# Patient Record
Sex: Male | Born: 1935 | Race: Black or African American | Hispanic: No | Marital: Single | Smoking: Former smoker
Health system: Southern US, Community
[De-identification: ages and names within clinical notes are randomized; demographics above are authoritative.]

## PROBLEM LIST (undated history)

## (undated) DIAGNOSIS — K219 Gastro-esophageal reflux disease without esophagitis: Secondary | ICD-10-CM

## (undated) DIAGNOSIS — I10 Essential (primary) hypertension: Secondary | ICD-10-CM

## (undated) DIAGNOSIS — Z87898 Personal history of other specified conditions: Secondary | ICD-10-CM

## (undated) DIAGNOSIS — E785 Hyperlipidemia, unspecified: Secondary | ICD-10-CM

## (undated) DIAGNOSIS — R011 Cardiac murmur, unspecified: Secondary | ICD-10-CM

## (undated) DIAGNOSIS — K5790 Diverticulosis of intestine, part unspecified, without perforation or abscess without bleeding: Secondary | ICD-10-CM

## (undated) DIAGNOSIS — Z86010 Personal history of colonic polyps: Secondary | ICD-10-CM

## (undated) HISTORY — DX: Hyperlipidemia, unspecified: E78.5

## (undated) HISTORY — DX: Gastro-esophageal reflux disease without esophagitis: K21.9

## (undated) HISTORY — DX: Personal history of other specified conditions: Z87.898

## (undated) HISTORY — DX: Essential (primary) hypertension: I10

## (undated) HISTORY — DX: Diverticulosis of intestine, part unspecified, without perforation or abscess without bleeding: K57.90

## (undated) HISTORY — DX: Cardiac murmur, unspecified: R01.1

## (undated) HISTORY — DX: Personal history of colonic polyps: Z86.010

## (undated) HISTORY — PX: COLONOSCOPY W/ POLYPECTOMY: SHX1380

## (undated) HISTORY — PX: CATARACT EXTRACTION: SUR2

---

## 1964-11-27 DIAGNOSIS — Z87898 Personal history of other specified conditions: Secondary | ICD-10-CM

## 1964-11-27 HISTORY — DX: Personal history of other specified conditions: Z87.898

## 2002-04-30 ENCOUNTER — Encounter (INDEPENDENT_AMBULATORY_CARE_PROVIDER_SITE_OTHER): Payer: Self-pay

## 2002-04-30 ENCOUNTER — Ambulatory Visit (HOSPITAL_COMMUNITY): Admission: RE | Admit: 2002-04-30 | Discharge: 2002-04-30 | Payer: Self-pay | Admitting: Gastroenterology

## 2004-06-02 ENCOUNTER — Encounter: Admission: RE | Admit: 2004-06-02 | Discharge: 2004-06-02 | Payer: Self-pay | Admitting: Internal Medicine

## 2004-11-27 DIAGNOSIS — K5790 Diverticulosis of intestine, part unspecified, without perforation or abscess without bleeding: Secondary | ICD-10-CM

## 2004-11-27 HISTORY — DX: Diverticulosis of intestine, part unspecified, without perforation or abscess without bleeding: K57.90

## 2005-04-17 ENCOUNTER — Ambulatory Visit: Payer: Self-pay | Admitting: Internal Medicine

## 2005-04-27 HISTORY — PX: COLONOSCOPY: SHX174

## 2005-04-27 LAB — HM COLONOSCOPY: HM Colonoscopy: NORMAL

## 2005-05-17 ENCOUNTER — Ambulatory Visit: Payer: Self-pay | Admitting: Internal Medicine

## 2005-08-15 ENCOUNTER — Ambulatory Visit: Payer: Self-pay | Admitting: Internal Medicine

## 2005-08-22 ENCOUNTER — Ambulatory Visit: Payer: Self-pay | Admitting: Internal Medicine

## 2005-09-01 ENCOUNTER — Ambulatory Visit: Payer: Self-pay | Admitting: Internal Medicine

## 2005-12-19 ENCOUNTER — Ambulatory Visit: Payer: Self-pay | Admitting: Internal Medicine

## 2005-12-20 ENCOUNTER — Ambulatory Visit: Payer: Self-pay

## 2006-02-27 ENCOUNTER — Ambulatory Visit: Payer: Self-pay | Admitting: Internal Medicine

## 2006-04-18 ENCOUNTER — Ambulatory Visit: Payer: Self-pay | Admitting: Internal Medicine

## 2006-07-02 ENCOUNTER — Ambulatory Visit: Payer: Self-pay | Admitting: Internal Medicine

## 2006-09-19 ENCOUNTER — Ambulatory Visit: Payer: Self-pay | Admitting: Internal Medicine

## 2006-10-23 ENCOUNTER — Ambulatory Visit: Payer: Self-pay | Admitting: Internal Medicine

## 2006-11-08 ENCOUNTER — Ambulatory Visit: Payer: Self-pay | Admitting: Internal Medicine

## 2006-12-06 ENCOUNTER — Ambulatory Visit: Payer: Self-pay | Admitting: Internal Medicine

## 2006-12-06 LAB — CONVERTED CEMR LAB
Chol/HDL Ratio, serum: 2.5
Cholesterol: 87 mg/dL (ref 0–200)
Glucose, Bld: 98 mg/dL (ref 70–99)

## 2006-12-24 ENCOUNTER — Ambulatory Visit: Payer: Self-pay | Admitting: Internal Medicine

## 2007-03-15 DIAGNOSIS — R011 Cardiac murmur, unspecified: Secondary | ICD-10-CM | POA: Insufficient documentation

## 2007-03-15 DIAGNOSIS — Z86010 Personal history of colonic polyps: Secondary | ICD-10-CM | POA: Insufficient documentation

## 2007-03-15 DIAGNOSIS — R748 Abnormal levels of other serum enzymes: Secondary | ICD-10-CM | POA: Insufficient documentation

## 2007-04-23 ENCOUNTER — Ambulatory Visit: Payer: Self-pay | Admitting: Internal Medicine

## 2007-04-23 ENCOUNTER — Encounter: Payer: Self-pay | Admitting: Internal Medicine

## 2007-04-23 DIAGNOSIS — E785 Hyperlipidemia, unspecified: Secondary | ICD-10-CM | POA: Insufficient documentation

## 2007-04-23 DIAGNOSIS — I10 Essential (primary) hypertension: Secondary | ICD-10-CM | POA: Insufficient documentation

## 2007-04-23 LAB — CONVERTED CEMR LAB
Basophils Absolute: 0 10*3/uL (ref 0.0–0.1)
Basophils Relative: 0.3 % (ref 0.0–1.0)
Eosinophils Absolute: 0.1 10*3/uL (ref 0.0–0.6)
Eosinophils Relative: 1.5 % (ref 0.0–5.0)
HCT: 43.5 % (ref 39.0–52.0)
HDL: 33.4 mg/dL — ABNORMAL LOW (ref >39.0)
LDL Cholesterol: 66 mg/dL (ref 0–99)
Monocytes Absolute: 0.4 10*3/uL (ref 0.2–0.7)
Neutro Abs: 2.8 10*3/uL (ref 1.4–7.7)
RBC: 4.76 M/uL (ref 4.22–5.81)
Total CHOL/HDL Ratio: 3.3
Triglycerides: 54 mg/dL (ref 0–149)
WBC: 4.6 10*3/uL (ref 4.5–10.5)

## 2007-04-30 ENCOUNTER — Encounter: Payer: Self-pay | Admitting: Internal Medicine

## 2007-05-09 ENCOUNTER — Encounter: Payer: Self-pay | Admitting: Internal Medicine

## 2007-05-13 ENCOUNTER — Telehealth (INDEPENDENT_AMBULATORY_CARE_PROVIDER_SITE_OTHER): Payer: Self-pay | Admitting: *Deleted

## 2007-05-15 ENCOUNTER — Encounter: Payer: Self-pay | Admitting: Internal Medicine

## 2007-05-22 ENCOUNTER — Encounter: Payer: Self-pay | Admitting: Internal Medicine

## 2007-09-11 ENCOUNTER — Ambulatory Visit: Payer: Self-pay | Admitting: Internal Medicine

## 2008-01-14 ENCOUNTER — Telehealth (INDEPENDENT_AMBULATORY_CARE_PROVIDER_SITE_OTHER): Payer: Self-pay | Admitting: *Deleted

## 2008-01-22 ENCOUNTER — Encounter: Payer: Self-pay | Admitting: Internal Medicine

## 2008-01-31 ENCOUNTER — Telehealth (INDEPENDENT_AMBULATORY_CARE_PROVIDER_SITE_OTHER): Payer: Self-pay | Admitting: *Deleted

## 2008-02-05 ENCOUNTER — Encounter: Payer: Self-pay | Admitting: Internal Medicine

## 2008-02-19 ENCOUNTER — Encounter: Payer: Self-pay | Admitting: Internal Medicine

## 2008-03-19 ENCOUNTER — Ambulatory Visit: Payer: Self-pay | Admitting: Internal Medicine

## 2008-03-29 LAB — CONVERTED CEMR LAB
Albumin: 3.9 g/dL (ref 3.5–5.2)
Bilirubin, Direct: 0.1 mg/dL (ref 0.0–0.3)
CO2: 31 meq/L (ref 19–32)
Calcium: 9.3 mg/dL (ref 8.4–10.5)
Chloride: 105 meq/L (ref 96–112)
Cholesterol: 107 mg/dL (ref 0–200)
GFR calc Af Amer: 77 mL/min
Glucose, Bld: 94 mg/dL (ref 70–99)
Total Bilirubin: 1 mg/dL (ref 0.3–1.2)
Triglycerides: 52 mg/dL (ref 0–149)

## 2008-03-31 ENCOUNTER — Encounter (INDEPENDENT_AMBULATORY_CARE_PROVIDER_SITE_OTHER): Payer: Self-pay | Admitting: *Deleted

## 2008-05-21 ENCOUNTER — Ambulatory Visit: Payer: Self-pay | Admitting: Internal Medicine

## 2008-05-21 ENCOUNTER — Telehealth (INDEPENDENT_AMBULATORY_CARE_PROVIDER_SITE_OTHER): Payer: Self-pay | Admitting: *Deleted

## 2008-05-21 DIAGNOSIS — K219 Gastro-esophageal reflux disease without esophagitis: Secondary | ICD-10-CM | POA: Insufficient documentation

## 2008-05-21 DIAGNOSIS — R351 Nocturia: Secondary | ICD-10-CM | POA: Insufficient documentation

## 2008-05-22 ENCOUNTER — Encounter (INDEPENDENT_AMBULATORY_CARE_PROVIDER_SITE_OTHER): Payer: Self-pay | Admitting: *Deleted

## 2008-06-10 ENCOUNTER — Encounter: Payer: Self-pay | Admitting: Internal Medicine

## 2008-07-22 ENCOUNTER — Encounter: Payer: Self-pay | Admitting: Internal Medicine

## 2008-08-19 ENCOUNTER — Ambulatory Visit: Payer: Self-pay | Admitting: Internal Medicine

## 2008-10-15 ENCOUNTER — Telehealth (INDEPENDENT_AMBULATORY_CARE_PROVIDER_SITE_OTHER): Payer: Self-pay | Admitting: *Deleted

## 2008-10-27 ENCOUNTER — Telehealth (INDEPENDENT_AMBULATORY_CARE_PROVIDER_SITE_OTHER): Payer: Self-pay | Admitting: *Deleted

## 2009-01-06 ENCOUNTER — Telehealth (INDEPENDENT_AMBULATORY_CARE_PROVIDER_SITE_OTHER): Payer: Self-pay | Admitting: *Deleted

## 2009-03-15 ENCOUNTER — Encounter: Payer: Self-pay | Admitting: Internal Medicine

## 2009-05-04 ENCOUNTER — Encounter: Payer: Self-pay | Admitting: Internal Medicine

## 2009-05-04 ENCOUNTER — Telehealth (INDEPENDENT_AMBULATORY_CARE_PROVIDER_SITE_OTHER): Payer: Self-pay | Admitting: *Deleted

## 2009-05-18 ENCOUNTER — Ambulatory Visit: Payer: Self-pay | Admitting: Internal Medicine

## 2009-05-18 LAB — CONVERTED CEMR LAB
Albumin: 3.7 g/dL (ref 3.5–5.2)
Basophils Absolute: 0 10*3/uL (ref 0.0–0.1)
CO2: 29 meq/L (ref 19–32)
Cholesterol: 90 mg/dL (ref 0–200)
Eosinophils Absolute: 0.1 10*3/uL (ref 0.0–0.7)
GFR calc non Af Amer: 84.83 mL/min (ref >60)
HDL: 33.4 mg/dL — ABNORMAL LOW (ref >39.00)
MCHC: 34.7 g/dL (ref 30.0–36.0)
MCV: 92.1 fL (ref 78.0–100.0)
Monocytes Absolute: 0.4 10*3/uL (ref 0.1–1.0)
Monocytes Relative: 9 % (ref 3.0–12.0)
Neutro Abs: 2.5 10*3/uL (ref 1.4–7.7)
RBC: 4.32 M/uL (ref 4.22–5.81)
Total CHOL/HDL Ratio: 3
Total Protein: 6.7 g/dL (ref 6.0–8.3)
Triglycerides: 48 mg/dL (ref 0.0–149.0)
VLDL: 9.6 mg/dL (ref 0.0–40.0)
WBC: 4.6 10*3/uL (ref 4.5–10.5)

## 2009-05-24 ENCOUNTER — Ambulatory Visit: Payer: Self-pay | Admitting: Internal Medicine

## 2009-05-24 DIAGNOSIS — D696 Thrombocytopenia, unspecified: Secondary | ICD-10-CM | POA: Insufficient documentation

## 2009-05-24 DIAGNOSIS — R51 Headache: Secondary | ICD-10-CM | POA: Insufficient documentation

## 2009-05-24 DIAGNOSIS — R519 Headache, unspecified: Secondary | ICD-10-CM | POA: Insufficient documentation

## 2009-05-24 DIAGNOSIS — I491 Atrial premature depolarization: Secondary | ICD-10-CM | POA: Insufficient documentation

## 2009-05-24 DIAGNOSIS — M25519 Pain in unspecified shoulder: Secondary | ICD-10-CM | POA: Insufficient documentation

## 2009-05-25 ENCOUNTER — Encounter: Payer: Self-pay | Admitting: Internal Medicine

## 2009-05-27 ENCOUNTER — Encounter (INDEPENDENT_AMBULATORY_CARE_PROVIDER_SITE_OTHER): Payer: Self-pay | Admitting: *Deleted

## 2009-05-27 ENCOUNTER — Ambulatory Visit: Payer: Self-pay | Admitting: Internal Medicine

## 2009-05-27 LAB — CONVERTED CEMR LAB
OCCULT 1: NEGATIVE
OCCULT 2: NEGATIVE
OCCULT 3: NEGATIVE

## 2009-07-07 ENCOUNTER — Telehealth (INDEPENDENT_AMBULATORY_CARE_PROVIDER_SITE_OTHER): Payer: Self-pay | Admitting: *Deleted

## 2009-07-23 ENCOUNTER — Telehealth (INDEPENDENT_AMBULATORY_CARE_PROVIDER_SITE_OTHER): Payer: Self-pay | Admitting: *Deleted

## 2009-07-30 ENCOUNTER — Telehealth (INDEPENDENT_AMBULATORY_CARE_PROVIDER_SITE_OTHER): Payer: Self-pay | Admitting: *Deleted

## 2009-08-16 ENCOUNTER — Telehealth (INDEPENDENT_AMBULATORY_CARE_PROVIDER_SITE_OTHER): Payer: Self-pay | Admitting: *Deleted

## 2009-08-24 ENCOUNTER — Ambulatory Visit: Payer: Self-pay | Admitting: Internal Medicine

## 2009-10-13 ENCOUNTER — Ambulatory Visit: Payer: Self-pay | Admitting: Internal Medicine

## 2009-10-16 LAB — CONVERTED CEMR LAB
AST: 28 units/L (ref 0–37)
Bilirubin, Direct: 0 mg/dL (ref 0.0–0.3)
Total Bilirubin: 0.8 mg/dL (ref 0.3–1.2)
Total Protein: 7 g/dL (ref 6.0–8.3)
Triglycerides: 56 mg/dL (ref 0.0–149.0)
VLDL: 11.2 mg/dL (ref 0.0–40.0)

## 2009-10-18 ENCOUNTER — Encounter (INDEPENDENT_AMBULATORY_CARE_PROVIDER_SITE_OTHER): Payer: Self-pay | Admitting: *Deleted

## 2009-11-10 ENCOUNTER — Telehealth (INDEPENDENT_AMBULATORY_CARE_PROVIDER_SITE_OTHER): Payer: Self-pay | Admitting: *Deleted

## 2009-12-22 ENCOUNTER — Telehealth (INDEPENDENT_AMBULATORY_CARE_PROVIDER_SITE_OTHER): Payer: Self-pay | Admitting: *Deleted

## 2010-05-23 ENCOUNTER — Encounter: Payer: Self-pay | Admitting: Internal Medicine

## 2010-05-31 ENCOUNTER — Ambulatory Visit: Payer: Self-pay | Admitting: Internal Medicine

## 2010-05-31 DIAGNOSIS — N400 Enlarged prostate without lower urinary tract symptoms: Secondary | ICD-10-CM | POA: Insufficient documentation

## 2010-06-01 LAB — CONVERTED CEMR LAB
ALT: 25 units/L (ref 0–53)
AST: 27 units/L (ref 0–37)
Alkaline Phosphatase: 89 units/L (ref 39–117)
BUN: 15 mg/dL (ref 6–23)
Bilirubin, Direct: 0.2 mg/dL (ref 0.0–0.3)
Calcium: 9 mg/dL (ref 8.4–10.5)
Chloride: 108 meq/L (ref 96–112)
Eosinophils Relative: 1 % (ref 0.0–5.0)
GFR calc non Af Amer: 92.28 mL/min (ref >60)
Glucose, Bld: 94 mg/dL (ref 70–99)
Lymphs Abs: 1.2 10*3/uL (ref 0.7–4.0)
MCV: 92 fL (ref 78.0–100.0)
Monocytes Relative: 8.1 % (ref 3.0–12.0)
PSA: 2 ng/mL (ref 0.10–4.00)
Platelets: 122 10*3/uL — ABNORMAL LOW (ref 150.0–400.0)
RBC: 4.55 M/uL (ref 4.22–5.81)
RDW: 12.9 % (ref 11.5–14.6)
Sodium: 141 meq/L (ref 135–145)
Total Bilirubin: 0.7 mg/dL (ref 0.3–1.2)
Total CHOL/HDL Ratio: 3
Total Protein: 7.1 g/dL (ref 6.0–8.3)
Triglycerides: 64 mg/dL (ref 0.0–149.0)
WBC: 4.9 10*3/uL (ref 4.5–10.5)

## 2010-06-20 ENCOUNTER — Encounter: Payer: Self-pay | Admitting: Internal Medicine

## 2010-08-02 ENCOUNTER — Ambulatory Visit: Payer: Self-pay | Admitting: Internal Medicine

## 2010-12-25 LAB — CONVERTED CEMR LAB: LDL Goal: 90 mg/dL

## 2010-12-27 NOTE — Progress Notes (Signed)
Summary: pt. Rx. refill request  Phone Note Refill Request Message from:  Patient on December 22, 2009 2:29 PM  Refills Requested: Medication #1:  TOPROL XL 100 MG  TB24 take one tablet daily   Brand Name Necessary? No  Medication #2:  NEXIUM 40 MG CPDR 1 q am.   Brand Name Necessary? No  Medication #3:  DOXAZOSIN MESYLATE 4 MG TABS 1 by mouth two times a day   Brand Name Necessary? No  Medication #4:  SIMVASTATIN 40 MG TABS 1 by mouth once daily   Brand Name Necessary? No Needs a generic for Nexium because his co pay for this is 175.00 Pt. would like for you leave his rx. at the front desk.... He would like a 90day supply. call pt. if you have any problems [phone]  Initial call taken by: Otho Ket,  December 22, 2009 2:31 PM  Follow-up for Phone Call        Dr.Hopper please advise Follow-up by: Georgette Dover,  December 22, 2009 3:11 PM  Additional Follow-up for Phone Call Additional follow up Details #1::        Omeprazole 20 mg #90; Refill others X 90 days Additional Follow-up by: Unice Cobble MD,  December 22, 2009 4:35 PM    Additional Follow-up for Phone Call Additional follow up Details #2::    Patient aware rx's avaliable for pick-up./[facility]  December 22, 2009 5:03 PM   New/Updated Medications: OMEPRAZOLE 20 MG TBEC (OMEPRAZOLE) 1 by mouth once daily Prescriptions: OMEPRAZOLE 20 MG TBEC (OMEPRAZOLE) 1 by mouth once daily  #90 x 2   Entered by:   Georgette Dover   Authorized by:   Unice Cobble MD   Signed by:   Georgette Dover on 12/22/2009   Method used:   Reprint   [id number] OMEPRAZOLE 20 MG TBEC (OMEPRAZOLE) 1 by mouth once daily  #0 x 2   Entered by:   Georgette Dover   Authorized by:   Unice Cobble MD   Signed by:   Georgette Dover on 12/22/2009   Method used:   Print then Give to Patient   RxID:   [id number] DOXAZOSIN MESYLATE 4 MG TABS (DOXAZOSIN MESYLATE) 1 by mouth two times a day  #180 x 2   Entered by:   Georgette Dover  Authorized by:   Unice Cobble MD   Signed by:   Georgette Dover on 12/22/2009   Method used:   Print then Give to Patient   RxID:   [id number] TOPROL XL 100 MG  TB24 (METOPROLOL SUCCINATE) take one tablet daily  #90 x 2   Entered by:   Georgette Dover   Authorized by:   Unice Cobble MD   Signed by:   Georgette Dover on 12/22/2009   Method used:   Print then Give to Patient   RxID:   [id number] SIMVASTATIN 40 MG TABS (SIMVASTATIN) 1 by mouth once daily  #90 x 2   Entered by:   Georgette Dover   Authorized by:   Unice Cobble MD   Signed by:   Georgette Dover on 12/22/2009   Method used:   Print then Give to Patient   RxID:   [id number]

## 2010-12-27 NOTE — Letter (Signed)
Summary: [facility]  [facility]   Imported By: Edmonia James 06/08/2010 10:23:42  _____________________________________________________________________  External Attachment:    Type:   Image     Comment:   External Document

## 2010-12-27 NOTE — Assessment & Plan Note (Signed)
Summary: fasting cpx- jr--Rm 5   Vital Signs:  Patient profile:   75 year old male Height:      73 inches Weight:      192.13 pounds BMI:     25.44 Temp:     98.3 degrees F oral Pulse rate:   72 / minute Pulse rhythm:   regular Resp:     14 per minute BP sitting:   130 / 80  (left arm) Cuff size:   regular  Vitals Entered By: Kelle Darting CMA Deborra Medina) (May 31, 2010 11:04 AM) CC: Rm 5   Physical, fasting., Shoulder pain Is Patient Diabetic? No   CC:  Rm 5   Physical, fasting., and Shoulder pain.  History of Present Illness: Here for Medicare AWV:  1.Risk factors based on Past M, S, F history:Arthritis R shoulder & LLE, hyperlipidemia, ERD 2.Physical Activities: yardwork; he plans to start walking program 3.Depression/mood:active issues  denied 4.Hearing: no loss; normal to whisper @ 6 ft 5.ADL's: no limitations, fully independent 6.Fall Risk: denied  7.Home Safety:no environmental or emotional risks present 8.Height, weight, &visual acuity:vision normal w/o lenses reading wall chart; bifocals for near vision  9.Counseling: none requested 10.Labs ordered based on risk factors: see Orders; Dr Diona Fanti requested PSA 11. Referral Coordination: Colonoscopy 06/20/2010, Dr Earlean Shawl . Dr Sherlean Foot, Optometrist, to be seen 06/2010 12. Care Plan: see Instructions 13. Cognitive Assessment: Oriented X3;recall & memory intact; no mood change noted Hyperlipidemia Follow-Up      This is a 75 year old man who presents for Hyperlipidemia follow-up.  The patient reports occasional  muscle aches, but denies GI upset, abdominal pain, flushing, itching, constipation, diarrhea, and fatigue.  Other symptoms include rare palpitations.  The patient denies the following symptoms: chest pain/pressure, exercise intolerance, dypsnea, syncope, and pedal edema.  Compliance with medications (by patient report) has been near 100%.  Dietary compliance has been good.  The patient reports exercising occasionally.   Adjunctive measures currently used by the patient include ASA and fish oil supplements.   Shoulder Pain      The patient also presents with Shoulder pain.  The patient denies numbness, weakness, tingling, locking, stiffness, impaired ROM, swelling, and redness.  The pain is located in the right shoulder.  The pain began gradually and without injury.  The patient describes the pain as intermittent and aching.  The pain is better with activity.  The pain is worse with rest in RLDP. He has pain LLE in LLDP also.    Preventive Screening-Counseling & Management  Alcohol-Tobacco     Alcohol drinks/day: 0     Smoking Status: quit > 6 months     Year Quit: 1969  Caffeine-Diet-Exercise     Caffeine use/day: 1 cup /day     Diet Comments: low fat diet  Hep-HIV-STD-Contraception     Dental Visit-last 6 months yes     Sun Exposure-Excessive: no  Safety-Violence-Falls     Seat Belt Use: yes     Firearms in the Home: firearms in the home     Smoke Detectors: yes     Violence in the Home: no risk noted     Fall Risk: none      Sexual History:  currently monogamous.        Drug Use:  never.        Blood Transfusions:  no.        Travel History:  no travel recently.    Allergies (verified): No Known Drug Allergies  Family History: Mother:MI @  age 49  Siblings: brother:colon polyps,HTN,MI  @ age 32  Father :cns  cancer  (brain tumor)  Social History: Heart Healthy Retired Former Smoker: 1969 Alcohol use-yes: rare, < 1X/month Smoking Status:  quit > 6 months Caffeine use/day:  1 cup /day Dental Care w/in 6 mos.:  yes Sun Exposure-Excessive:  no Seat Belt Use:  yes Fall Risk:  none Sexual History:  currently monogamous Blood Transfusions:  no Drug Use:  never   Impression & Recommendations:  Problem # 1:  [place] (ICD-V70.0)  Orders: Subsequent annual wellness visit with prevention plan [id number])  Problem # 2:  SHOULDER PAIN, RIGHT (ICD-719.41)  His updated  medication list for this problem includes:    Aspirin 81 Mg Tbec (Aspirin) .Marland Kitchen... Take one tablet daily  Problem # 3:  HYPERLIPIDEMIA (ICD-272.4)  His updated medication list for this problem includes:    Simvastatin 40 Mg Tabs (Simvastatin) .Marland Kitchen... 1 by mouth once daily  Orders: Venipuncture [id number]) TLB-Lipid Panel (80061-LIPID) TLB-BMP (Basic Metabolic Panel-BMET) (99991111) TLB-Hepatic/Liver Function Pnl (80076-HEPATIC) TLB-TSH (Thyroid Stimulating Hormone) (84443-TSH)  Problem # 4:  HYPERTROPHY PROSTATE W/O UR OBST & OTH LUTS (ICD-600.00)  PSA as per Dr Diona Fanti His updated medication list for this problem includes:    Doxazosin Mesylate 4 Mg Tabs (Doxazosin mesylate) .Marland Kitchen... 1 by mouth two times a day  Orders: TLB-PSA (Prostate Specific Antigen) (84153-PSA)  Problem # 5:  GERD (ICD-530.81)  His updated medication list for this problem includes:    Hyomax-ft 0.125 Mg Tbdp (Hyoscyamine sulfate) .Marland Kitchen... Take one tablet as needed    Omeprazole 20 Mg Tbec (Omeprazole) .Marland Kitchen... 1 by mouth once daily  Orders: TLB-CBC Platelet - w/Differential (85025-CBCD)  Problem # 6:  COLONIC POLYPS, HX OF (ICD-V12.72) as per Dr Earlean Shawl  Complete Medication List: 1)  Simvastatin 40 Mg Tabs (Simvastatin) .Marland Kitchen.. 1 by mouth once daily 2)  Toprol Xl 100 Mg Tb24 (Metoprolol succinate) .... Take one tablet daily 3)  Doxazosin Mesylate 4 Mg Tabs (Doxazosin mesylate) .Marland Kitchen.. 1 by mouth two times a day 4)  Aspirin 81 Mg Tbec (Aspirin) .... Take one tablet daily 5)  Hyomax-ft 0.125 Mg Tbdp (Hyoscyamine sulfate) .... Take one tablet as needed 6)  Flaxseed Oil 1000 Mg Caps (Flaxseed (linseed)) .... Take one tablet daily 7)  Fish Oil 1200 Mg Caps (Omega-3 fatty acids) .... Take one tablet daily 8)  Centrum Silver Tabs (Multiple vitamins-minerals) .... Take one tablet daily 9)  Omeprazole 20 Mg Tbec (Omeprazole) .Marland Kitchen.. 1 by mouth once daily 10)  Oxybutynin Chloride 10 Mg Xr24h-tab (Oxybutynin chloride) .... Take  1 tablet by mouth once a day 11)  Amoxicillin 500 Mg Caps (Amoxicillin) .... Take 4 capsules before dental visit.  Patient Instructions: 1)  Avoid foods high in acid (tomatoes, citrus juices, spicy foods). Avoid eating within two hours of lying down or before exercising. Do not over eat; try smaller more frequent meals. Elevate head of bed twelve inches when sleeping. 2)  It is important that you exercise regularly at least 20 minutes 5 times a week. If you develop chest pain, have severe difficulty breathing, or feel very tired , stop exercising immediately and seek medical attention. 3)  Choose your Health care [place] and/or prepare a Living Will.  Current Allergies (reviewed today): No known allergies     Preventive Care Screening  Colonoscopy:    Date:  04/27/2005    Results:  normal

## 2010-12-27 NOTE — Assessment & Plan Note (Signed)
Summary: flu shot/kn   Nurse Visit   Allergies: No Known Drug Allergies  Orders Added: 1)  Flu Vaccine 39yrs + MEDICARE PATIENTS [Q2039] 2)  Administration Flu vaccine - MCR [id number] Flu Vaccine Consent Questions     Do you have a history of severe allergic reactions to this vaccine? no    Any prior history of allergic reactions to egg and/or gelatin? no    Do you have a sensitivity to the preservative Thimersol? no    Do you have a past history of Guillan-Barre Syndrome? no    Do you currently have an acute febrile illness? no    Have you ever had a severe reaction to latex? no    Vaccine information given and explained to patient? yes    Are you currently pregnant? no    Lot Number:[id number]   Exp Date:05/27/2011   Site Given  Right Deltoid IMistration Flu vaccine - MCR [id number] .lbmedflu

## 2010-12-27 NOTE — Procedures (Signed)
Summary: Colonoscopy/[facility]  Colonoscopy/[facility]   Imported By: Edmonia James 07/04/2010 12:58:51  _____________________________________________________________________  External Attachment:    Type:   Image     Comment:   External Document

## 2011-02-04 ENCOUNTER — Encounter: Payer: Self-pay | Admitting: Internal Medicine

## 2011-02-17 ENCOUNTER — Other Ambulatory Visit: Payer: Self-pay | Admitting: Internal Medicine

## 2011-03-16 ENCOUNTER — Other Ambulatory Visit: Payer: Self-pay | Admitting: Internal Medicine

## 2011-03-20 ENCOUNTER — Other Ambulatory Visit: Payer: Self-pay | Admitting: Internal Medicine

## 2011-03-20 NOTE — Telephone Encounter (Signed)
RX was sent in on the 19th, I called rx in today in case it did not go through on the 19th.

## 2011-04-06 ENCOUNTER — Other Ambulatory Visit: Payer: Self-pay | Admitting: Internal Medicine

## 2011-04-10 ENCOUNTER — Other Ambulatory Visit: Payer: Self-pay | Admitting: Internal Medicine

## 2011-04-25 ENCOUNTER — Other Ambulatory Visit: Payer: Self-pay | Admitting: Internal Medicine

## 2011-05-13 ENCOUNTER — Other Ambulatory Visit: Payer: Self-pay | Admitting: Internal Medicine

## 2011-05-30 ENCOUNTER — Encounter: Payer: Self-pay | Admitting: Internal Medicine

## 2011-06-05 ENCOUNTER — Encounter: Payer: Self-pay | Admitting: Internal Medicine

## 2011-06-05 ENCOUNTER — Ambulatory Visit (INDEPENDENT_AMBULATORY_CARE_PROVIDER_SITE_OTHER): Payer: Medicare Other | Admitting: Internal Medicine

## 2011-06-05 VITALS — BP 140/86 | HR 64 | Temp 98.5°F | Resp 14 | Ht 73.5 in | Wt 188.2 lb

## 2011-06-05 DIAGNOSIS — N400 Enlarged prostate without lower urinary tract symptoms: Secondary | ICD-10-CM

## 2011-06-05 DIAGNOSIS — Z86010 Personal history of colonic polyps: Secondary | ICD-10-CM

## 2011-06-05 DIAGNOSIS — D696 Thrombocytopenia, unspecified: Secondary | ICD-10-CM

## 2011-06-05 DIAGNOSIS — E785 Hyperlipidemia, unspecified: Secondary | ICD-10-CM

## 2011-06-05 DIAGNOSIS — Z23 Encounter for immunization: Secondary | ICD-10-CM

## 2011-06-05 DIAGNOSIS — I10 Essential (primary) hypertension: Secondary | ICD-10-CM

## 2011-06-05 DIAGNOSIS — Z0000 Encounter for general adult medical examination without abnormal findings: Secondary | ICD-10-CM

## 2011-06-05 DIAGNOSIS — K3189 Other diseases of stomach and duodenum: Secondary | ICD-10-CM

## 2011-06-05 DIAGNOSIS — R011 Cardiac murmur, unspecified: Secondary | ICD-10-CM

## 2011-06-05 LAB — CBC WITH DIFFERENTIAL/PLATELET
Hemoglobin: 15.1 g/dL (ref 13.0–17.0)
Lymphs Abs: 1.3 10*3/uL (ref 0.7–4.0)
MCV: 93.4 fl (ref 78.0–100.0)
Neutrophils Relative %: 68.5 % (ref 43.0–77.0)
RBC: 4.68 Mil/uL (ref 4.22–5.81)
RDW: 13.4 % (ref 11.5–14.6)
WBC: 5.7 10*3/uL (ref 4.5–10.5)

## 2011-06-05 LAB — TSH: TSH: 1.4 u[IU]/mL (ref 0.35–5.50)

## 2011-06-05 LAB — HEPATIC FUNCTION PANEL
AST: 27 U/L (ref 0–37)
Albumin: 4.6 g/dL (ref 3.5–5.2)
Alkaline Phosphatase: 79 U/L (ref 39–117)
Total Bilirubin: 0.8 mg/dL (ref 0.3–1.2)

## 2011-06-05 LAB — BASIC METABOLIC PANEL
GFR: 88.03 mL/min (ref >60.00)
Glucose, Bld: 97 mg/dL (ref 70–99)

## 2011-06-05 NOTE — Patient Instructions (Signed)
Preventive Health Care: Exercise at least 30-45 minutes a day,  3-4 days a week.  Eat a low-fat diet with lots of fruits and vegetables, up to 7-9 servings per day.Consume less than 40 grams of sugar per day from foods & drinks with High Fructose Corn Sugar as #2,3 or # 4 on label. The triggers for dyspepsia or "heart burn"  include stress; the "aspirin family" ; alcohol; peppermint; and caffeine (coffee, tea, cola, and chocolate). The aspirin family would include aspirin and the nonsteroidal agents such as ibuprofen &  Naproxen. Tylenol would not cause reflux. If having dyspepsia ; food & dink should be avoided for @ least 2 hors before going to bed.

## 2011-06-05 NOTE — Progress Notes (Signed)
Subjective:    Patient ID: Alvin Ingram, male    DOB: 19-Mar-1936, 75 y.o.   MRN: [id number]  HPI Medicare Wellness Visit:  The following psychosocial & medical history were reviewed as required by Medicare.   Social history: caffeine: none , alcohol:  rarely ,  tobacco use : quit 1969  & exercise : 4X/ week as walking & yard work.   Home & personal  safety / fall risk: no issues, activities of daily living: no limitations , seatbelt use : yes , and smoke alarm employment : yes .  Power of Attorney/Living Will status : pending  Vision ( as recorded per Nurse) & Hearing  evaluation :Appt 09/12 with Dr Sherlean Foot.   Whisper heard @ 6 ft. Orientation :oriented X3 , memory & recall :excellent (see PMH), spelling or math testing: normal,and mood & affect :  normal . Depression / anxiety: denied Travel history : 2001 [place] , immunization status :Pneumovax today , transfusion history:  no, and preventive health surveillance ( colonoscopies, BMD , etc as per protocol/ [facility]): see Problem List, Dental care:  Every 6 mos . Chart reviewed &  Updated. Active issues reviewed & addressed.       Review of Systems he has minor reflux symptoms. He does have trouble swallowing peanuts at times. There is no frank dysphasia. He denies melena or rectal bleeding.  6 weeks ago he did have some radicular pain in the left lower extremity which resolved without treatment.     Objective:   Physical Exam Gen.: Healthy and well-nourished in appearance. Alert, appropriate and cooperative throughout exam. Head: Normocephalic without obvious abnormalities; pattern alopecia. Moustache Eyes: No corneal or conjunctival inflammation noted. Pupils equal round reactive to light and accommodation.Decreased light reflex OD. Fundal exam is benign without hemorrhages, exudate, papilledema. Extraocular motion intact. Vision grossly normal. Ears: External  ear exam reveals no significant lesions or deformities. Canals clear .TMs normal.  Hearing is grossly normal bilaterally. Nose: External nasal exam reveals no deformity or inflammation. Nasal mucosa are pink and moist. No lesions or exudates noted.  Mouth: Oral mucosa and oropharynx reveal no lesions or exudates. Teeth in good repair.Upper & lower partials Neck: No deformities, masses, or tenderness noted. Range of motion & Thyroid normal. Lungs: Normal respiratory effort; chest expands symmetrically. Lungs are clear to auscultation without rales, wheezes, or increased work of breathing. Heart: Slow rate and regular  rhythm. Normal S1 and S2. No gallop, click, or rub. S4 w/o  murmur. Abdomen: Bowel sounds normal; abdomen soft and nontender. No masses, organomegaly or hernias noted. Genitalia/DRE: as per Urologist   .                                                                                   Musculoskeletal/extremities: No deformity or scoliosis noted of  the thoracic or lumbar spine. He lay down & sat up w/o help. Neg SLR bilaterally.No clubbing, cyanosis, edema, or deformity noted. Range of motion  normal .Tone & strength  normal.Joints normal. Nail health  good. Vascular: Carotid, radial artery, dorsalis pedis and  posterior tibial pulses are full and equal. No bruits present. Neurologic: Alert and oriented x3. Deep tendon  reflexes symmetrical and normal.          Skin: Intact without suspicious lesions or rashes. Lymph: No cervical, axillary, or inguinal lymphadenopathy present. Psych: Mood and affect are normal. Normally interactive                                                                                        Assessment & Plan:  #1 Medicare Wellness Exam; criteria met ; data entered #2 Problem List reviewed ; Assessment/ Recommendations made #3 minor GERD symptoms #4 Lumbar radiculopathy , self limited Plan: see Orders

## 2011-06-06 ENCOUNTER — Encounter: Payer: Self-pay | Admitting: Internal Medicine

## 2011-06-26 ENCOUNTER — Telehealth: Payer: Self-pay

## 2011-06-26 MED ORDER — METOPROLOL SUCCINATE ER 100 MG PO TB24: 100.0000 mg | ORAL_TABLET | Freq: Every day | ORAL | Status: DC

## 2011-06-26 MED ORDER — OMEPRAZOLE 20 MG PO CPDR: 20.0000 mg | DELAYED_RELEASE_CAPSULE | Freq: Every day | ORAL | Status: DC

## 2011-06-26 MED ORDER — DOXAZOSIN MESYLATE 4 MG PO TABS: 4.0000 mg | ORAL_TABLET | Freq: Two times a day (BID) | ORAL | Status: DC

## 2011-06-26 NOTE — Telephone Encounter (Signed)
Lipids needed every 6 mos with AST, ALT ( 272.4, 995.20)

## 2011-06-26 NOTE — Telephone Encounter (Signed)
Patient called to request refills on all meds that Dr.Hopper fills. Zocor,cardura,toprol, and omeprazole.   Dr.Hopper please advise on refill request for Zocor, patient stated that you told him to take 1/2 by mouth daily but the med list indicated 1 by mouth daily. I also noticed that patient had recent labs but no cholesterol labs were ordered (unless you ordered [facility])

## 2011-06-27 ENCOUNTER — Other Ambulatory Visit: Payer: Self-pay | Admitting: Internal Medicine

## 2011-06-27 DIAGNOSIS — N400 Enlarged prostate without lower urinary tract symptoms: Secondary | ICD-10-CM

## 2011-06-27 DIAGNOSIS — E786 Lipoprotein deficiency: Secondary | ICD-10-CM

## 2011-06-27 DIAGNOSIS — T887XXA Unspecified adverse effect of drug or medicament, initial encounter: Secondary | ICD-10-CM

## 2011-06-27 NOTE — Telephone Encounter (Signed)
Spoke with patient, patient coming in tomorrow for labs PSA 600.0 and Lipid 272.4

## 2011-06-28 ENCOUNTER — Other Ambulatory Visit (INDEPENDENT_AMBULATORY_CARE_PROVIDER_SITE_OTHER): Payer: Medicare Other

## 2011-06-28 DIAGNOSIS — T887XXA Unspecified adverse effect of drug or medicament, initial encounter: Secondary | ICD-10-CM

## 2011-06-28 DIAGNOSIS — E786 Lipoprotein deficiency: Secondary | ICD-10-CM

## 2011-06-28 DIAGNOSIS — N400 Enlarged prostate without lower urinary tract symptoms: Secondary | ICD-10-CM

## 2011-06-28 LAB — LIPID PANEL
Cholesterol: 103 mg/dL (ref 0–200)
LDL Cholesterol: 50 mg/dL (ref 0–99)

## 2011-06-28 NOTE — Progress Notes (Signed)
Labs only

## 2011-07-03 ENCOUNTER — Other Ambulatory Visit: Payer: Self-pay | Admitting: Internal Medicine

## 2011-07-05 NOTE — Progress Notes (Signed)
Labs only

## 2011-07-13 ENCOUNTER — Telehealth: Payer: Self-pay

## 2011-07-13 MED ORDER — SIMVASTATIN 40 MG PO TABS: ORAL_TABLET | ORAL | Status: DC

## 2011-07-13 NOTE — Telephone Encounter (Signed)
Patient called triage line to inform Dr.Hopper that he see's a Urologist and will further f/u with them to have PSA addressed (see recent labs). Patient also would like to schedule appointment to have shingles vaccine (to be seen tomorrow at 2pm), patient then mentioned he will also need a 90 day supply of his cholesterol med printed off(he will pick up tomorrow when he gets shingles vaccine)   RX printed and placed on Chemira's desk to give to patient when he comes in for Nurse visit

## 2011-07-14 ENCOUNTER — Ambulatory Visit (INDEPENDENT_AMBULATORY_CARE_PROVIDER_SITE_OTHER): Payer: Medicare Other

## 2011-07-14 DIAGNOSIS — Z23 Encounter for immunization: Secondary | ICD-10-CM

## 2011-07-25 ENCOUNTER — Other Ambulatory Visit: Payer: Self-pay | Admitting: Internal Medicine

## 2011-08-12 ENCOUNTER — Other Ambulatory Visit: Payer: Self-pay | Admitting: Internal Medicine

## 2011-09-12 ENCOUNTER — Ambulatory Visit (INDEPENDENT_AMBULATORY_CARE_PROVIDER_SITE_OTHER): Payer: Medicare Other

## 2011-09-12 DIAGNOSIS — Z23 Encounter for immunization: Secondary | ICD-10-CM

## 2011-09-13 ENCOUNTER — Other Ambulatory Visit: Payer: Self-pay | Admitting: Internal Medicine

## 2012-05-11 ENCOUNTER — Other Ambulatory Visit: Payer: Self-pay | Admitting: Internal Medicine

## 2012-06-05 ENCOUNTER — Encounter: Payer: Self-pay | Admitting: Internal Medicine

## 2012-06-05 ENCOUNTER — Ambulatory Visit (INDEPENDENT_AMBULATORY_CARE_PROVIDER_SITE_OTHER): Payer: Medicare Other | Admitting: Internal Medicine

## 2012-06-05 VITALS — BP 136/82 | HR 66 | Temp 98.1°F | Resp 12 | Ht 74.0 in | Wt 185.2 lb

## 2012-06-05 DIAGNOSIS — E785 Hyperlipidemia, unspecified: Secondary | ICD-10-CM

## 2012-06-05 DIAGNOSIS — D696 Thrombocytopenia, unspecified: Secondary | ICD-10-CM

## 2012-06-05 DIAGNOSIS — Z0000 Encounter for general adult medical examination without abnormal findings: Secondary | ICD-10-CM

## 2012-06-05 DIAGNOSIS — Z86010 Personal history of colonic polyps: Secondary | ICD-10-CM

## 2012-06-05 DIAGNOSIS — I10 Essential (primary) hypertension: Secondary | ICD-10-CM

## 2012-06-05 LAB — LIPID PANEL
Cholesterol: 102 mg/dL (ref 0–200)
LDL Cholesterol: 52 mg/dL (ref 0–99)
Triglycerides: 45 mg/dL (ref 0.0–149.0)

## 2012-06-05 LAB — CBC WITH DIFFERENTIAL/PLATELET
Basophils Relative: 0.2 % (ref 0.0–3.0)
Eosinophils Relative: 1.9 % (ref 0.0–5.0)
HCT: 41.6 % (ref 39.0–52.0)
Hemoglobin: 14.2 g/dL (ref 13.0–17.0)
Lymphs Abs: 1.1 10*3/uL (ref 0.7–4.0)
MCV: 93.1 fl (ref 78.0–100.0)
Monocytes Absolute: 0.4 10*3/uL (ref 0.1–1.0)
Monocytes Relative: 9.5 % (ref 3.0–12.0)
Neutro Abs: 2.2 10*3/uL (ref 1.4–7.7)
Platelets: 107 10*3/uL — ABNORMAL LOW (ref 150.0–400.0)
WBC: 3.8 10*3/uL — ABNORMAL LOW (ref 4.5–10.5)

## 2012-06-05 LAB — BASIC METABOLIC PANEL
BUN: 14 mg/dL (ref 6–23)
Chloride: 101 mEq/L (ref 96–112)
GFR: 83.24 mL/min (ref >60.00)
Glucose, Bld: 102 mg/dL — ABNORMAL HIGH (ref 70–99)
Potassium: 4.1 mEq/L (ref 3.5–5.1)
Sodium: 138 mEq/L (ref 135–145)

## 2012-06-05 LAB — HEPATIC FUNCTION PANEL
ALT: 24 U/L (ref 0–53)
AST: 24 U/L (ref 0–37)
Albumin: 4.2 g/dL (ref 3.5–5.2)
Total Bilirubin: 1 mg/dL (ref 0.3–1.2)
Total Protein: 7.1 g/dL (ref 6.0–8.3)

## 2012-06-05 NOTE — Progress Notes (Signed)
Subjective:    Patient ID: Alvin Ingram, male    DOB: 1936/04/16, 76 y.o.   MRN: [id number]  HPI Medicare Wellness Visit:  The following psychosocial & medical history were reviewed as required by Medicare.   Social history: caffeine: decaf products , alcohol: rarely ,  tobacco use : quit 1969  & exercise : walking & yard work.   Home & personal  safety / fall risk: no issues, activities of daily living: no limitations , seatbelt use :yes , and smoke alarm employment : yes .  Power of Attorney/Living Will status : NO  Vision ( as recorded per Nurse) & Hearing  evaluation : Dr Sherlean Foot seen annually. Hearing evaluation 2011. Orientation :oriented X 3 , memory & recall :good, math testing: good,and mood & affect : normal . Depression / anxiety: denied Travel history : 1991 [place] , immunization status :up to date , transfusion history: no, and preventive health surveillance ( colonoscopies, BMD , etc as per protocol/ [facility]): colonoscopy due 2016, Dental care: seen every 6 mos . Chart reviewed &  Updated. Active issues reviewed & addressed.       Review of Systems HYPERTENSION: Disease Monitoring: Blood pressure range-monitor broken  Chest pain, palpitations- occasional tachycardia       Dyspnea- no Medications: Compliance- yes Lightheadedness,Syncope- no    Edema-no  HYPERLIPIDEMIA: Disease Monitoring: See symptoms for Hypertension Medications: Compliance-yes  Abd pain, bowel changes- occasional GERD  Muscle aches- no        Objective:   Physical Exam Gen.: Healthy and well-nourished in appearance. Alert, appropriate and cooperative throughout exam.Appears younger than stated age  Head: Normocephalic without obvious abnormalities Eyes: No corneal or conjunctival inflammation noted.  Extraocular motion intact. Vision grossly normal. Ears: External  ear exam reveals no significant lesions or deformities. Canals clear .TMs normal. Hearing is grossly normal bilaterally. Nose:  External nasal exam reveals no deformity or inflammation. Nasal mucosa are pink and moist. No lesions or exudates noted.   Mouth: Oral mucosa and oropharynx reveal no lesions or exudates. Teeth in good repair.Upper & lower partials Neck: No deformities, masses, or tenderness noted. Range of motion &Thyroid normal Lungs: Normal respiratory effort; chest expands symmetrically. Lungs are clear to auscultation without rales, wheezes, or increased work of breathing. Heart: Normal rate and rhythm. Normal S1 and S2. No gallop, click, or rub. S4 with slurring; no murmur. Abdomen: Bowel sounds normal; abdomen soft and nontender. No masses, organomegaly or hernias noted. Genitalia/ DRE: Dr Diona Fanti                                                                    Musculoskeletal/extremities: No deformity or scoliosis noted of  the thoracic or lumbar spine. No clubbing, cyanosis, edema, or deformity noted. Range of motion  normal .Tone & strength  normal. Nail health  good. Flexion contractures of the fingers bilaterally Vascular: Carotid, radial artery, dorsalis pedis and  posterior tibial pulses are full and equal. No bruits present. Neurologic: Alert and oriented x3. Deep tendon reflexes symmetrical and normal.          Skin: Intact without suspicious lesions or rashes.Slight vitiliginous pigment hand changes Lymph: No cervical, axillary lymphadenopathy present. Psych: Mood and affect are normal. Normally interactive  Assessment & Plan:  #1 Medicare Wellness Exam; criteria met ; data entered #2 Problem List reviewed ; Assessment/ Recommendations made Plan: see Orders

## 2012-06-05 NOTE — Patient Instructions (Addendum)
Preventive Health Care: Exercise at least 30-45 minutes a day,  3-4 days a week.  Eat a low-fat diet with lots of fruits and vegetables, up to 7-9 servings per day.  Consume less than 40 grams of sugar per day from foods & drinks with High Fructose Corn Sugar as #1,2,3 or # 4 on label. Health Care Power of [place]. Complete if not in place ; these place you in charge of your health care decisions. The triggers for reflux or "heart burn"  include stress; the "aspirin family" ; alcohol; peppermint; and caffeine (coffee, tea, cola, and chocolate). The aspirin family would include aspirin and the nonsteroidal agents such as ibuprofen &  Naproxen. Tylenol would not cause reflux. If having symptoms ; food & drink should be avoided for @ least 2 hours before going to bed.   Blood Pressure Goal  Ideally is an AVERAGE < 135/85. This AVERAGE should be calculated from @ least 5-7 BP readings taken @ different times of day on different days of week. You should not respond to isolated BP readings , but rather the AVERAGE for that week . Please try to go on My Chart within the next 24 hours to allow me to release the results directly to you.

## 2012-07-02 ENCOUNTER — Other Ambulatory Visit: Payer: Self-pay | Admitting: Internal Medicine

## 2012-07-22 ENCOUNTER — Other Ambulatory Visit: Payer: Self-pay | Admitting: Internal Medicine

## 2012-08-07 ENCOUNTER — Other Ambulatory Visit: Payer: Self-pay | Admitting: Internal Medicine

## 2012-08-08 ENCOUNTER — Ambulatory Visit (INDEPENDENT_AMBULATORY_CARE_PROVIDER_SITE_OTHER): Payer: Medicare Other

## 2012-08-08 DIAGNOSIS — Z23 Encounter for immunization: Secondary | ICD-10-CM

## 2012-12-19 ENCOUNTER — Other Ambulatory Visit: Payer: Self-pay

## 2012-12-20 ENCOUNTER — Telehealth: Payer: Self-pay | Admitting: Internal Medicine

## 2012-12-20 ENCOUNTER — Emergency Department (HOSPITAL_COMMUNITY)
Admission: EM | Admit: 2012-12-20 | Discharge: 2012-12-20 | Disposition: A | Discharge status: Home / Self Care | Payer: Medicare Other | Attending: Emergency Medicine | Admitting: Emergency Medicine

## 2012-12-20 ENCOUNTER — Emergency Department (HOSPITAL_COMMUNITY): Payer: Medicare Other

## 2012-12-20 ENCOUNTER — Encounter (HOSPITAL_COMMUNITY): Payer: Self-pay | Admitting: Emergency Medicine

## 2012-12-20 DIAGNOSIS — I10 Essential (primary) hypertension: Secondary | ICD-10-CM | POA: Insufficient documentation

## 2012-12-20 DIAGNOSIS — E785 Hyperlipidemia, unspecified: Secondary | ICD-10-CM | POA: Insufficient documentation

## 2012-12-20 DIAGNOSIS — Z87891 Personal history of nicotine dependence: Secondary | ICD-10-CM | POA: Insufficient documentation

## 2012-12-20 DIAGNOSIS — Z7982 Long term (current) use of aspirin: Secondary | ICD-10-CM | POA: Insufficient documentation

## 2012-12-20 DIAGNOSIS — Z860100 Personal history of colon polyps, unspecified: Secondary | ICD-10-CM | POA: Insufficient documentation

## 2012-12-20 DIAGNOSIS — Z8669 Personal history of other diseases of the nervous system and sense organs: Secondary | ICD-10-CM | POA: Insufficient documentation

## 2012-12-20 DIAGNOSIS — R011 Cardiac murmur, unspecified: Secondary | ICD-10-CM | POA: Insufficient documentation

## 2012-12-20 DIAGNOSIS — R0789 Other chest pain: Secondary | ICD-10-CM | POA: Insufficient documentation

## 2012-12-20 DIAGNOSIS — R079 Chest pain, unspecified: Secondary | ICD-10-CM

## 2012-12-20 DIAGNOSIS — Z8719 Personal history of other diseases of the digestive system: Secondary | ICD-10-CM | POA: Insufficient documentation

## 2012-12-20 DIAGNOSIS — K219 Gastro-esophageal reflux disease without esophagitis: Secondary | ICD-10-CM | POA: Insufficient documentation

## 2012-12-20 DIAGNOSIS — Z79899 Other long term (current) drug therapy: Secondary | ICD-10-CM | POA: Insufficient documentation

## 2012-12-20 LAB — BASIC METABOLIC PANEL
GFR calc Af Amer: 75 mL/min — ABNORMAL LOW (ref >90)
GFR calc non Af Amer: 65 mL/min — ABNORMAL LOW (ref >90)
Potassium: 4.3 mEq/L (ref 3.5–5.1)
Sodium: 139 mEq/L (ref 135–145)

## 2012-12-20 LAB — POCT I-STAT TROPONIN I: Troponin i, poc: 0 ng/mL (ref 0.00–0.08)

## 2012-12-20 LAB — CBC WITH DIFFERENTIAL/PLATELET
Basophils Relative: 0 % (ref 0–1)
Eosinophils Absolute: 0.1 10*3/uL (ref 0.0–0.7)
MCH: 31.2 pg (ref 26.0–34.0)
MCHC: 35.7 g/dL (ref 30.0–36.0)
Neutrophils Relative %: 69 % (ref 43–77)
Platelets: 101 10*3/uL — ABNORMAL LOW (ref 150–400)
RBC: 4.65 MIL/uL (ref 4.22–5.81)

## 2012-12-20 MED ORDER — PANTOPRAZOLE SODIUM 40 MG IV SOLR
40.0000 mg | Freq: Once | INTRAVENOUS | Status: AC
  Administered 2012-12-20: 40 mg via INTRAVENOUS
  Filled 2012-12-20: qty 40

## 2012-12-20 MED ORDER — GI COCKTAIL ~~LOC~~
30.0000 mL | Freq: Once | ORAL | Status: AC
  Administered 2012-12-20: 30 mL via ORAL
  Filled 2012-12-20: qty 30

## 2012-12-20 NOTE — ED Provider Notes (Signed)
Medical screening examination/treatment/procedure(s) were conducted as a shared visit with non-physician practitioner(s) and myself.  I personally evaluated the patient during the encounter.  Patient is experiencing chest pain is extremely atypical. He reports that the pain only last for 5 seconds at a time and then resolves. His workup was entirely unremarkable. Patient was reassured that this is not cardiac.  Orpah Greek, MD 12/20/12 [phone]

## 2012-12-20 NOTE — ED Notes (Signed)
Per EMS - pt woke up at 5am this morning with sharp shooting CP on lower left side, lasted 5 sec. Happened again around 6am/8am/10am and 11:15. All lasted very briefly. Pt denies any other symptoms. 12 lead unremarkable. BP 172/90 HR 66. Pt in nad. Lungs clear. EMS started a 20 G in left hand.

## 2012-12-20 NOTE — ED Provider Notes (Signed)
History     CSN: [id number]  Arrival date & time 12/20/12  1305   First MD Initiated Contact with Patient 12/20/12 1315      Chief Complaint  Patient presents with  . Chest Pain    (Consider location/radiation/quality/duration/timing/severity/associated sxs/prior treatment) HPI Comments: Patient is a 77 year old with a past medical history including HTN, GERD, and HLD who presents with chest pain that started suddenly this morning. Patient reports laying in bed and having sudden onset of sharp chest pain that was located in the left chest without. The patient denies associated nausea, dizziness, and diaphoresis. The pain lasted a few seconds and spontaneously resolved without intervention. No aggravating/allevaiting factors. Patient denies fever, headache, visual changes, vomiting, diarrhea, abdominal pain, numbness/tingling.     Past Medical History  Diagnosis Date  . History of colonic polyps 2003,2011    Dr Earlean Shawl  . Hypertension     hypertensive response @ stress test  . Cardiac murmur     Aortic  . GERD (gastroesophageal reflux disease)   . Hyperlipidemia   . History of syncope 1966    ? stress related  . Diverticulosis 2006    Past Surgical History  Procedure Date  . Colonoscopy 04/2005    tics, no polyps. Dr.Medoff  . Cataract extraction 05/2002    OD,Dr.Epes  . Colonoscopy w/ polypectomy 2003, 2011  . Cataract extraction 06/2010    OS    Family History  Problem Relation Age of Onset  . Heart attack Mother 18  . Colon polyps Brother   . Hypertension Brother   . Heart attack Brother 63  . Other Father     Benign Brain Tumor  . Tuberculosis Paternal Uncle   . Seizures Brother      idiopathic; resolved in teens    History  Substance Use Topics  . Smoking status: Former Smoker    Quit date: 11/28/1967  . Smokeless tobacco: Not on file  . Alcohol Use: Yes     Comment: RARELY      Review of Systems  Cardiovascular: Positive for chest pain.  All  other systems reviewed and are negative.    Allergies  Review of patient's allergies indicates no known allergies.  Home Medications   Current Outpatient Rx  Name  Route  Sig  Dispense  Refill  . AMITRIPTYLINE HCL 25 MG PO TABS   Oral   Take 25 mg by mouth at bedtime.         . AMOXICILLIN 500 MG PO CAPS   Oral   Take 500 mg by mouth. Take 4 cap before dental visit          . ASPIRIN 81 MG PO TABS   Oral   Take 81 mg by mouth daily.           Marland Kitchen DOXAZOSIN MESYLATE 8 MG PO TABS   Oral   Take 8 mg by mouth 2 (two) times daily.         Marland Kitchen FLAX SEED OIL PO   Oral   Take 1,200 mg by mouth daily.           Marland Kitchen METOPROLOL SUCCINATE ER 100 MG PO TB24   Oral   Take 100 mg by mouth daily. Take with or immediately following a meal.         . CENTRUM SILVER PO   Oral   Take by mouth daily.           Marland Kitchen FISH  OIL 1200 MG PO CAPS   Oral   Take 1,200 mg by mouth daily.           Marland Kitchen OMEPRAZOLE 20 MG PO CPDR   Oral   Take 20 mg by mouth daily.         . OXYBUTYNIN CHLORIDE ER 15 MG PO TB24   Oral   Take 15 mg by mouth daily.         Marland Kitchen SIMVASTATIN 40 MG PO TABS   Oral   Take 40 mg by mouth every evening.           BP 160/76  Pulse 62  Temp 98.1 F (36.7 C) (Oral)  Resp 21  SpO2 100%  Physical Exam  Nursing note and vitals reviewed. Constitutional: He is oriented to person, place, and time. He appears well-developed and well-nourished. No distress.  HENT:  Head: Normocephalic and atraumatic.  Eyes: Conjunctivae normal and EOM are normal.  Neck: Normal range of motion. Neck supple.  Cardiovascular: Normal rate and regular rhythm.  Exam reveals no gallop and no friction rub.   No murmur heard. Pulmonary/Chest: Effort normal and breath sounds normal. He has no wheezes. He has no rales. He exhibits no tenderness.  Abdominal: Soft. He exhibits no distension. There is no tenderness. There is no rebound and no guarding.  Musculoskeletal: Normal range of  motion.  Neurological: He is alert and oriented to person, place, and time. Coordination normal.       Speech is goal-oriented. Moves limbs without ataxia.   Skin: Skin is warm and dry.  Psychiatric: He has a normal mood and affect. His behavior is normal.    ED Course  Procedures (including critical care time)  Labs Reviewed  CBC WITH DIFFERENTIAL - Abnormal; Notable for the following:    Platelets 101 (*)  PLATELET COUNT CONFIRMED BY SMEAR   All other components within normal limits  BASIC METABOLIC PANEL - Abnormal; Notable for the following:    Glucose, Bld 108 (*)     GFR calc non Af Amer 65 (*)     GFR calc Af Amer 75 (*)     All other components within normal limits  POCT I-STAT TROPONIN I   Dg Chest 2 View  12/20/2012  *RADIOLOGY REPORT*  Clinical Data: Chest pain.  CHEST - 2 VIEW  Comparison: None.  Findings: Mild hyperinflation of the lungs.  Biapical pleural thickening/scarring.  No acute opacities or effusions. Degenerative changes in the thoracic spine.  IMPRESSION: Mild COPD/chronic changes.  No active disease.   Original Report Authenticated By: Rolm Baptise, M.D.      1. Chest pain       MDM  2:22 PM Labs unremarkable. EKG unremarkable. Troponin negative.   2:43 PM Patient will be discharged with instructions to follow up with his PCP. Patient instructed to return with worsening or concerning symptoms. Vitals stable for discharge.      Alvina Chou, PA-C 12/20/12 1444

## 2012-12-20 NOTE — Telephone Encounter (Signed)
Patient Information:  Caller Name: Johnnathan  Phone: [phone]  Patient: Alvin Ingram, Alvin Ingram  Gender: Male  DOB: 1936/07/21  Age: 77 Years  PCP: Unice Cobble  Office Follow Up:  Does the office need to follow up with this patient?: No  Instructions For The Office: N/A  RN Note:  Patient states he developed left chest pain, onset 12/20/12 0500. States pain has been intermittent since. States pain does not radiate. Denies nausea, dyspnea, dizziness or diaphoresis. States pain is intermittent and lasting only "5-10 seconds,"  but continues to recur at intervals. Patient states he took one Aspirin 81mg . Patient advised to chew 3 additional Aspirin 81mg . now. Patient verbalizes understanding and agreeable. Patient advised not to be alone. Patient advised not to drive self.  Symptoms  Reason For Call & Symptoms: Chest Pain  Reviewed Health History In EMR: Yes  Reviewed Medications In EMR: Yes  Reviewed Allergies In EMR: Yes  Reviewed Surgeries / Procedures: Yes  Date of Onset of Symptoms: 12/20/2012  Guideline(s) Used:  Chest Pain  Disposition Per Guideline:   Go to ED Now (or to Office with PCP Approval)  Reason For Disposition Reached:   Intermittent chest pain and pain has been increasing in severity or frequency  Advice Given:  Call Back If:  Severe chest pain  Constant chest pain lasting longer than 5 minutes  You become worse.  RN Overrode Recommendation:  Go To ED  No appts. available in office until 1515 12/20/12. Patient advised to be evaluated in the ED at [facility] now. Advised not to drive self. Advised to call 911 if sx increase or chest pain becomes persistent. Patient verbalizes understanding and agreeable.

## 2012-12-20 NOTE — ED Notes (Signed)
Pt returned from radiology.

## 2012-12-27 ENCOUNTER — Encounter: Payer: Self-pay | Admitting: Internal Medicine

## 2012-12-27 ENCOUNTER — Ambulatory Visit (INDEPENDENT_AMBULATORY_CARE_PROVIDER_SITE_OTHER): Payer: Medicare Other | Admitting: Internal Medicine

## 2012-12-27 VITALS — BP 126/70 | HR 66 | Wt 196.6 lb

## 2012-12-27 DIAGNOSIS — K219 Gastro-esophageal reflux disease without esophagitis: Secondary | ICD-10-CM

## 2012-12-27 NOTE — Patient Instructions (Addendum)
Reflux of gastric acid may be asymptomatic as this may occur mainly during sleep.The triggers for reflux  include stress; the "aspirin family" ; alcohol; peppermint; and caffeine (coffee, tea, cola, and chocolate). The aspirin family would include aspirin and the nonsteroidal agents such as ibuprofen &  Naproxen. Tylenol would not cause reflux. If having symptoms ; food & drink should be avoided for @ least 2 hours before going to bed.    Please  schedule fasting Labs : AST,ALT CK,Lipids. PLEASE BRING THESE INSTRUCTIONS TO FOLLOW UP  LAB APPOINTMENT.This will guarantee correct labs are drawn, eliminating need for repeat blood sampling ( needle sticks ! ). Diagnoses /Codes: [id number]

## 2012-12-27 NOTE — Progress Notes (Signed)
  Subjective:    Patient ID: Alvin Ingram, male    DOB: 05-26-1936, 77 y.o.   MRN: [id number]  HPI He is here to followup his emergency room visit  12/20/12 for chest pain. He had recurrent left-sided chest pain at rest; labs and imaging were reviewed.  Chest x-ray suggested COPD with biapical scarring. He also degenerative changes in the thoracic spine.  Cardiac enzymes were normal as was CBC and differential except for decreased platelet count at 101,000. Nonfasting glucose was 108. GFR was minimally reduced at 75.  He was given a GI cocktail with resolution of the symptoms. There's been no recurrence since 1/24.    Review of Systems He is not  on a heart healthy diet on regular basis; he exercises 30 minutes > 3 times per week without symptoms. Specifically he denies chest pain, palpitations, dyspnea, or claudication. Family history is negative for premature coronary disease .     He does have occasional dyspepsia in the mornings upon awakening. He denies unexplained weight loss, abdominal pain, melena, or rectal bleeding.                                                                    Objective:   Physical Exam General appearance: thin in but in good health and nourishment w/o distress.  Eyes: No conjunctival inflammation or scleral icterus is present.  Oral exam: Dental hygiene is good; upper & lower partials.Lips and gums are healthy appearing.There is no oropharyngeal erythema or exudate noted.   Heart:  Normal rate and regular rhythm. S1 and S2 normal without gallop, murmur, click, rub .S4  Lungs:Chest clear to auscultation; no wheezes, rhonchi,rales ,or rubs present.No increased work of breathing.   Abdomen: bowel sounds normal, soft and non-tender without masses, organomegaly or hernias noted.  No guarding or rebound   Skin:Warm & dry.  Intact without suspicious lesions or rashes   Lymphatic: No lymphadenopathy is noted about the head, neck, axilla.               Assessment & Plan:   #1 GERD See recommendations

## 2012-12-30 ENCOUNTER — Other Ambulatory Visit (INDEPENDENT_AMBULATORY_CARE_PROVIDER_SITE_OTHER): Payer: Medicare Other

## 2012-12-30 DIAGNOSIS — E785 Hyperlipidemia, unspecified: Secondary | ICD-10-CM

## 2012-12-30 DIAGNOSIS — T887XXA Unspecified adverse effect of drug or medicament, initial encounter: Secondary | ICD-10-CM

## 2012-12-31 LAB — AST: AST: 28 U/L (ref 0–37)

## 2012-12-31 LAB — LIPID PANEL
LDL Cholesterol: 57 mg/dL (ref 0–99)
Total CHOL/HDL Ratio: 3

## 2012-12-31 NOTE — Addendum Note (Signed)
Addended by: Modena Morrow D on: 12/31/2012 02:43 PM   Modules accepted: Orders

## 2013-02-05 ENCOUNTER — Telehealth: Payer: Self-pay | Admitting: Internal Medicine

## 2013-02-05 MED ORDER — METOPROLOL SUCCINATE ER 100 MG PO TB24: 100.0000 mg | ORAL_TABLET | Freq: Every day | ORAL | Status: DC

## 2013-02-05 NOTE — Telephone Encounter (Signed)
RX sent

## 2013-02-05 NOTE — Telephone Encounter (Signed)
Refill: Metoprolol succ er 100 mg tab. Take 1 tablet by mouth once a day. Qty 90. Last fill 11-05-12

## 2013-03-11 ENCOUNTER — Other Ambulatory Visit: Payer: Self-pay | Admitting: Internal Medicine

## 2013-06-09 ENCOUNTER — Ambulatory Visit: Payer: Medicare Other | Admitting: Internal Medicine

## 2013-06-09 ENCOUNTER — Ambulatory Visit (INDEPENDENT_AMBULATORY_CARE_PROVIDER_SITE_OTHER): Payer: Medicare Other | Admitting: Internal Medicine

## 2013-06-09 ENCOUNTER — Encounter: Payer: Self-pay | Admitting: Internal Medicine

## 2013-06-09 VITALS — BP 128/74 | HR 66 | Temp 98.5°F | Resp 12 | Ht 74.5 in | Wt 200.0 lb

## 2013-06-09 DIAGNOSIS — I10 Essential (primary) hypertension: Secondary | ICD-10-CM

## 2013-06-09 DIAGNOSIS — E785 Hyperlipidemia, unspecified: Secondary | ICD-10-CM

## 2013-06-09 DIAGNOSIS — Z23 Encounter for immunization: Secondary | ICD-10-CM

## 2013-06-09 DIAGNOSIS — Z0000 Encounter for general adult medical examination without abnormal findings: Secondary | ICD-10-CM

## 2013-06-09 LAB — LIPID PANEL
Cholesterol: 100 mg/dL (ref 0–200)
Total CHOL/HDL Ratio: 3
Triglycerides: 83 mg/dL (ref 0.0–149.0)

## 2013-06-09 MED ORDER — SIMVASTATIN 20 MG PO TABS: ORAL_TABLET | ORAL | Status: DC

## 2013-06-09 NOTE — Patient Instructions (Addendum)
Share results with all non [facility] medical staff seen  

## 2013-06-09 NOTE — Progress Notes (Signed)
Subjective:    Patient ID: Alvin Ingram, male    DOB: [birth year], 77 y.o.   MRN: [id number]  HPI Medicare Wellness Visit:  Psychosocial & medical history were reviewed as required by Medicare (abuse,antisocial behavioral risks,firearm risk).  Social history: caffeine:1 cup coffee & 1 tea / day  , alcohol: rarely  ,  tobacco use:   Quit 1969 Exercise :  See below Home & personal  safety / fall risk:no Limitations of activities of daily living:no Seatbelt  and smoke alarm use:yes Power of Attorney/Living Will status : needed Ophthalmology exam status :Dr Sherlean Foot annually Hearing evaluation [id number] Orientation :oriented X 3 Memory & recall :good Math testing:good Active depression / anxiety:denied Foreign travel history : 2003 [place] Immunization status for Shingles /Flu/ PNA/ tetanus :tetanus today Transfusion history:  no Preventive health surveillance status of colonoscopy as per protocol/ [id number] Dental care:  Every 6 mos Chart reviewed &  Updated. Active issues reviewed & addressed.      Review of Systems He is on a heart healthy diet; he exercises as walking  45-60 minutes 3-4 times per week without symptoms. Specifically he denies chest pain, palpitations, dyspnea, or claudication. Family history is negative for premature coronary disease. Advanced cholesterol testing reveals his LDL goal is less than 100, ideally < 70.  His LDL was 57  Feb ,2014. At that time simvastatin 40 mg at bedtime was decreased to one half pill daily.     Objective:   Physical Exam Gen.: Thin but healthy and well-nourished in appearance. Alert, appropriate and cooperative throughout exam.Appears younger than stated age  Head: Normocephalic without obvious abnormalities; pattern alopecia  Eyes: No corneal or conjunctival inflammation noted. Extraocular motion intact. Vision grossly normal without lenses Ears: External  ear exam reveals no significant lesions or deformities. Canals clear .TMs  normal. Hearing is grossly normal bilaterally. Nose: External nasal exam reveals no deformity or inflammation. Nasal mucosa are pink and moist. No lesions or exudates noted.  Mouth: Oral mucosa and oropharynx reveal no lesions or exudates. Teeth in good repair. Neck: No deformities, masses, or tenderness noted. Range of motion decreased. Thyroid normal. Lungs: Normal respiratory effort; chest expands symmetrically. Lungs are clear to auscultation without rales, wheezes, or increased work of breathing. Heart: Normal rate and rhythm. Normal S1 and S2. No gallop, click, or rub. Grade 1/6 systolic murmur. Abdomen: Bowel sounds normal; abdomen soft and nontender. No masses, organomegaly or hernias noted. Genitalia: As per Gyn                                  Musculoskeletal/extremities: No deformity or scoliosis noted of  the thoracic or lumbar spine.  No clubbing, cyanosis, edema, or significant extremity  deformity noted. Range of motion normal .Tone & strength  Normal. Joints  reveal mild  PIP  Fusiform changes. Nail health good. Able to lie down & sit up w/o help. Negative SLR bilaterally Vascular: Carotid, radial artery, dorsalis pedis and  posterior tibial pulses are full and equal. No bruits present. Neurologic: Alert and oriented x3. Deep tendon reflexes symmetrical and normal.        Skin: Intact without suspicious lesions or rashes. Lymph: No cervical, axillary lymphadenopathy present. Psych: Mood and affect are normal. Normally interactive  Assessment & Plan:  #1 Medicare Wellness Exam; criteria met ; data entered #2 Problem List/Diagnoses reviewed Plan:  Assessments made/ Orders entered  

## 2013-06-11 ENCOUNTER — Telehealth: Payer: Self-pay | Admitting: *Deleted

## 2013-06-11 NOTE — Telephone Encounter (Signed)
Pt left VM that he would like to get lab results. Called Pt back left VM that labs have been mailed to him but if he would like to get result please return call to office to review results.

## 2013-06-30 ENCOUNTER — Other Ambulatory Visit: Payer: Self-pay | Admitting: Internal Medicine

## 2013-07-16 ENCOUNTER — Other Ambulatory Visit: Payer: Self-pay | Admitting: Internal Medicine

## 2013-07-17 NOTE — Telephone Encounter (Signed)
LM @ (9:58am) asking the pt to RTC regarding refill request.  Need to know what dose the pt is taking.//AB/CMA

## 2013-07-17 NOTE — Telephone Encounter (Signed)
Patient states that he is ta

## 2013-07-17 NOTE — Telephone Encounter (Signed)
Patient states he takes 4 mg 2x daily. He would like to cancel this refill request until he calls his rx insurance co and compares prices at [facility]. He states he will call us and let us know where he wants it filled. He states he still has plenty of medication left.

## 2013-07-18 ENCOUNTER — Other Ambulatory Visit: Payer: Self-pay | Admitting: *Deleted

## 2013-07-18 MED ORDER — DOXAZOSIN MESYLATE 8 MG PO TABS: 8.0000 mg | ORAL_TABLET | Freq: Two times a day (BID) | ORAL | Status: DC

## 2013-07-18 MED ORDER — DOXAZOSIN MESYLATE 8 MG PO TABS: 4.0000 mg | ORAL_TABLET | Freq: Two times a day (BID) | ORAL | Status: DC

## 2013-07-18 NOTE — Telephone Encounter (Signed)
Rx has been refilled for Doxazosin 4 mg.  Ag cma

## 2013-08-08 ENCOUNTER — Telehealth: Payer: Self-pay | Admitting: Internal Medicine

## 2013-08-08 DIAGNOSIS — I10 Essential (primary) hypertension: Secondary | ICD-10-CM

## 2013-08-08 MED ORDER — METOPROLOL SUCCINATE ER 100 MG PO TB24: 100.0000 mg | ORAL_TABLET | Freq: Every day | ORAL | Status: DC

## 2013-08-08 NOTE — Telephone Encounter (Signed)
Patient called to get a medication refill for metoprolol succinate (TOPROL-XL) 100 MG 24 hr tablet [id number]. Patient also states that her pharmacy sent a request over and no response.  Pharmacy  [facility] [address]

## 2013-08-08 NOTE — Telephone Encounter (Signed)
Rf for metoprolol sent to [facility]

## 2013-08-12 ENCOUNTER — Other Ambulatory Visit: Payer: Self-pay | Admitting: *Deleted

## 2013-08-20 ENCOUNTER — Ambulatory Visit (INDEPENDENT_AMBULATORY_CARE_PROVIDER_SITE_OTHER): Payer: Medicare Other

## 2013-08-20 DIAGNOSIS — Z23 Encounter for immunization: Secondary | ICD-10-CM

## 2013-10-23 ENCOUNTER — Encounter: Payer: Self-pay | Admitting: Internal Medicine

## 2014-01-30 ENCOUNTER — Other Ambulatory Visit: Payer: Self-pay | Admitting: Internal Medicine

## 2014-01-30 ENCOUNTER — Other Ambulatory Visit: Payer: Self-pay | Admitting: *Deleted

## 2014-04-14 ENCOUNTER — Other Ambulatory Visit: Payer: Self-pay

## 2014-04-14 MED ORDER — DOXAZOSIN MESYLATE 8 MG PO TABS: 4.0000 mg | ORAL_TABLET | Freq: Two times a day (BID) | ORAL | Status: DC

## 2014-06-02 ENCOUNTER — Other Ambulatory Visit: Payer: Self-pay | Admitting: Internal Medicine

## 2014-06-11 ENCOUNTER — Other Ambulatory Visit (INDEPENDENT_AMBULATORY_CARE_PROVIDER_SITE_OTHER): Payer: Medicare HMO

## 2014-06-11 ENCOUNTER — Encounter: Payer: Self-pay | Admitting: Internal Medicine

## 2014-06-11 ENCOUNTER — Ambulatory Visit (INDEPENDENT_AMBULATORY_CARE_PROVIDER_SITE_OTHER): Payer: Medicare HMO | Admitting: Internal Medicine

## 2014-06-11 VITALS — BP 152/90 | HR 79 | Temp 98.2°F | Ht 73.5 in | Wt 200.4 lb

## 2014-06-11 DIAGNOSIS — I10 Essential (primary) hypertension: Secondary | ICD-10-CM

## 2014-06-11 DIAGNOSIS — K219 Gastro-esophageal reflux disease without esophagitis: Secondary | ICD-10-CM

## 2014-06-11 DIAGNOSIS — E785 Hyperlipidemia, unspecified: Secondary | ICD-10-CM

## 2014-06-11 DIAGNOSIS — N400 Enlarged prostate without lower urinary tract symptoms: Secondary | ICD-10-CM

## 2014-06-11 LAB — CBC WITH DIFFERENTIAL/PLATELET
BASOS ABS: 0 10*3/uL (ref 0.0–0.1)
BASOS PCT: 0.3 % (ref 0.0–3.0)
EOS ABS: 0.1 10*3/uL (ref 0.0–0.7)
Eosinophils Relative: 2 % (ref 0.0–5.0)
HCT: 44.3 % (ref 39.0–52.0)
HEMOGLOBIN: 14.8 g/dL (ref 13.0–17.0)
LYMPHS PCT: 28.4 % (ref 12.0–46.0)
Lymphs Abs: 1.5 10*3/uL (ref 0.7–4.0)
MCHC: 33.5 g/dL (ref 30.0–36.0)
MCV: 93.8 fl (ref 78.0–100.0)
MONOS PCT: 9.4 % (ref 3.0–12.0)
Monocytes Absolute: 0.5 10*3/uL (ref 0.1–1.0)
NEUTROS PCT: 59.9 % (ref 43.0–77.0)
Neutro Abs: 3.2 10*3/uL (ref 1.4–7.7)
Platelets: 128 10*3/uL — ABNORMAL LOW (ref 150.0–400.0)
RBC: 4.72 Mil/uL (ref 4.22–5.81)
RDW: 12.9 % (ref 11.5–15.5)
WBC: 5.4 10*3/uL (ref 4.0–10.5)

## 2014-06-11 LAB — LIPID PANEL
CHOL/HDL RATIO: 3
Cholesterol: 105 mg/dL (ref 0–200)
HDL: 34.2 mg/dL — AB (ref >39.00)
LDL CALC: 51 mg/dL (ref 0–99)
NONHDL: 70.8
TRIGLYCERIDES: 98 mg/dL (ref 0.0–149.0)
VLDL: 19.6 mg/dL (ref 0.0–40.0)

## 2014-06-11 LAB — HEPATIC FUNCTION PANEL
ALK PHOS: 98 U/L (ref 39–117)
ALT: 29 U/L (ref 0–53)
AST: 28 U/L (ref 0–37)
Albumin: 4.2 g/dL (ref 3.5–5.2)
BILIRUBIN TOTAL: 0.7 mg/dL (ref 0.2–1.2)
Bilirubin, Direct: 0.1 mg/dL (ref 0.0–0.3)
TOTAL PROTEIN: 7.2 g/dL (ref 6.0–8.3)

## 2014-06-11 LAB — TSH: TSH: 2.27 u[IU]/mL (ref 0.35–4.50)

## 2014-06-11 LAB — BASIC METABOLIC PANEL
BUN: 16 mg/dL (ref 6–23)
CALCIUM: 9.5 mg/dL (ref 8.4–10.5)
CO2: 29 meq/L (ref 19–32)
CREATININE: 1.1 mg/dL (ref 0.4–1.5)
Chloride: 105 mEq/L (ref 96–112)
GFR: 81.93 mL/min (ref >60.00)
Glucose, Bld: 109 mg/dL — ABNORMAL HIGH (ref 70–99)
Potassium: 4 mEq/L (ref 3.5–5.1)
SODIUM: 140 meq/L (ref 135–145)

## 2014-06-11 NOTE — Progress Notes (Signed)
Pre visit review using our clinic review tool, if applicable. No additional management support is needed unless otherwise documented below in the visit note. 

## 2014-06-11 NOTE — Assessment & Plan Note (Signed)
05/04/14 PSA 3.50

## 2014-06-11 NOTE — Assessment & Plan Note (Signed)
Lipids, LFTs, TSH ,CK 

## 2014-06-11 NOTE — Assessment & Plan Note (Signed)
Blood pressure goals reviewed. BMET 

## 2014-06-11 NOTE — Patient Instructions (Signed)

## 2014-06-11 NOTE — Assessment & Plan Note (Signed)
CBC

## 2014-06-11 NOTE — Progress Notes (Signed)
Subjective:    Patient ID: Alvin Ingram, male    DOB: 1936-05-04, 78 y.o.   MRN: [id number]  HPI He is here to assess active health issues & conditions. PMH, FH, & Social history verified & updated   A heart healthy diet is followed; exercise encompasses 30-45 minutes 3-4 times per week as walking or yard work without symptoms.  Family history is neg for premature coronary disease. Advanced cholesterol testing reveals  LDL goal is less than 100; ideally < 70 There is medication compliance with the statin.  Low dose ASA taken. BP @ home 130/78 on average. See today's BP.He "did not take meds this am; was rushed ; & did not sleep well last night".   Review of Systems   Specifically denied are  chest pain, palpitations, dyspnea, or claudication.  Significant abdominal symptoms, memory deficit, or myalgias not present. He denies significant dyspepsia, hoarseness, dysphagia, or extreme weight loss, melena, or rectal bleeding. He takes his protein pump inhibitor daily.     Objective:   Physical Exam  Significant or distinguishing  findings on physical exam are documented first.  Below that are other systems examined & findings. Pattern alopecia. He does have a mustache.  Upper and lower partials present Slight esotropia of the left eye at rest. He has flexion contractures of the PIP joints. Dorsalis pedis pulses are decreased but palpable. There is irregular vitiligo over the flexed joints of the hands.   Gen.: Healthy and well-nourished in appearance. Alert, appropriate and cooperative throughout exam. Appears younger than stated age  Head: Normocephalic without obvious abnormalities.  Eyes: No corneal or conjunctival inflammation noted. Pupils equal round reactive to light and accommodation. Extraocular motion intact.  Ears: External  Ear exam reveals no significant lesions or deformities. Canals clear .TMs normal. Hearing is grossly normal bilaterally. Nose: External nasal exam  reveals no deformity or inflammation. Nasal mucosa are pink and moist. No lesions or exudates noted.   Mouth: Oral mucosa and oropharynx reveal no lesions or exudates. Teeth in good repair. Neck: No deformities, masses, or tenderness noted. Range of motion &. Thyroid normal Lungs: Normal respiratory effort; chest expands symmetrically. Lungs are clear to auscultation without rales, wheezes, or increased work of breathing. Heart: Normal rate and rhythm. Normal S1 and S2. No gallop, click, or rub. No  murmur. Abdomen: Bowel sounds normal; abdomen soft and nontender. No masses, organomegaly or hernias noted. Genitalia:  as per Urology                            Musculoskeletal/extremities: No deformity or scoliosis noted of  the thoracic or lumbar spine.  No clubbing, cyanosis, or edema noted. Range of motion normal .Tone & strength normal. Fingernail  health good. Able to lie down & sit up w/o help. Negative SLR bilaterally Vascular: Carotid, radial artery, dorsalis pedis and  posterior tibial pulses are equal. No bruits present. Neurologic: Alert and oriented x3. Deep tendon reflexes symmetrical and normal.  Gait normal .      Skin: Intact without suspicious lesions or rashes. Lymph: No cervical, axillary lymphadenopathy present. Psych: Mood and affect are normal. Normally interactive  Assessment & Plan:  See Current Assessment & Plan in Problem List under specific Diagnosis

## 2014-06-15 ENCOUNTER — Telehealth: Payer: Self-pay

## 2014-06-15 ENCOUNTER — Ambulatory Visit: Payer: Medicare HMO

## 2014-06-15 DIAGNOSIS — R7309 Other abnormal glucose: Secondary | ICD-10-CM

## 2014-06-15 LAB — HEMOGLOBIN A1C: Hgb A1c MFr Bld: 5.4 % (ref 4.6–6.5)

## 2014-06-15 NOTE — Telephone Encounter (Signed)
Request for add on has been faxed to lab 

## 2014-06-15 NOTE — Telephone Encounter (Signed)
Message copied by Shelly Coss on Mon Jun 15, 2014  9:39 AM ------      Message from: Hendricks Limes      Created: Mon Jun 15, 2014  6:35 AM       Please add A1c (790.29)       ------

## 2014-06-23 ENCOUNTER — Other Ambulatory Visit: Payer: Self-pay

## 2014-06-23 MED ORDER — OMEPRAZOLE 20 MG PO CPDR: DELAYED_RELEASE_CAPSULE | ORAL | Status: DC

## 2014-07-28 ENCOUNTER — Other Ambulatory Visit: Payer: Self-pay | Admitting: Internal Medicine

## 2014-08-06 ENCOUNTER — Ambulatory Visit (INDEPENDENT_AMBULATORY_CARE_PROVIDER_SITE_OTHER): Payer: Medicare HMO

## 2014-08-06 DIAGNOSIS — Z23 Encounter for immunization: Secondary | ICD-10-CM

## 2014-09-02 ENCOUNTER — Other Ambulatory Visit: Payer: Self-pay | Admitting: Internal Medicine

## 2014-11-30 ENCOUNTER — Telehealth: Payer: Self-pay | Admitting: Internal Medicine

## 2014-11-30 MED ORDER — SIMVASTATIN 20 MG PO TABS
20.0000 mg | ORAL_TABLET | Freq: Every day | ORAL | Status: DC
Start: 2014-11-30 — End: 2016-04-17

## 2014-11-30 NOTE — Telephone Encounter (Signed)
Updated epic change pharmacy sent simvastatin...Alvin Ingram

## 2014-11-30 NOTE — Telephone Encounter (Signed)
[facility] called in and said that pt wants to transfer all med to them. He is requesting refill for simvastatin (ZOCOR) 20 MG tablet [id number]   Please send to below pharmacy    [facility] [place]

## 2015-01-15 ENCOUNTER — Other Ambulatory Visit: Payer: Self-pay | Admitting: Internal Medicine

## 2015-02-02 ENCOUNTER — Telehealth: Payer: Self-pay | Admitting: Internal Medicine

## 2015-02-02 MED ORDER — METOPROLOL SUCCINATE ER 100 MG PO TB24: ORAL_TABLET | ORAL | Status: DC

## 2015-02-02 NOTE — Telephone Encounter (Signed)
Pt called in requesting refill on metoprolol succinate (TOPROL-XL) 100 MG 24 hr tablet  90 day supply  [facility] [address] [phone]

## 2015-02-02 NOTE — Telephone Encounter (Signed)
Notified pt refill sent tot [facility]...Alvin Ingram

## 2015-02-14 ENCOUNTER — Other Ambulatory Visit: Payer: Self-pay | Admitting: Internal Medicine

## 2015-05-26 ENCOUNTER — Other Ambulatory Visit: Payer: Self-pay | Admitting: Internal Medicine

## 2015-05-26 ENCOUNTER — Telehealth: Payer: Self-pay | Admitting: Emergency Medicine

## 2015-05-26 NOTE — Telephone Encounter (Signed)
#  30 Needs OV before additional refills;come in fasting

## 2015-05-26 NOTE — Telephone Encounter (Signed)
LVM stating RX for simvastin was sent into pharm and to be sure to come in fasting for his appt on the 16 of July

## 2015-05-26 NOTE — Telephone Encounter (Signed)
Patients last office visit was July/2015---are you ok with refilling simvastatin and do you need follow up office visit---please advise, thanks

## 2015-05-26 NOTE — Telephone Encounter (Signed)
Pt called back and is aware of rx being sent and and fasting for labs for appt on 06/14/15

## 2015-06-14 ENCOUNTER — Ambulatory Visit (INDEPENDENT_AMBULATORY_CARE_PROVIDER_SITE_OTHER): Payer: Medicare HMO | Admitting: Internal Medicine

## 2015-06-14 ENCOUNTER — Encounter: Payer: Self-pay | Admitting: Internal Medicine

## 2015-06-14 ENCOUNTER — Other Ambulatory Visit (INDEPENDENT_AMBULATORY_CARE_PROVIDER_SITE_OTHER): Payer: Medicare HMO

## 2015-06-14 VITALS — BP 136/68 | HR 60 | Temp 98.0°F | Resp 16 | Ht 73.5 in | Wt 198.0 lb

## 2015-06-14 DIAGNOSIS — K219 Gastro-esophageal reflux disease without esophagitis: Secondary | ICD-10-CM | POA: Diagnosis not present

## 2015-06-14 DIAGNOSIS — E785 Hyperlipidemia, unspecified: Secondary | ICD-10-CM

## 2015-06-14 DIAGNOSIS — Z86010 Personal history of colonic polyps: Secondary | ICD-10-CM | POA: Diagnosis not present

## 2015-06-14 DIAGNOSIS — R739 Hyperglycemia, unspecified: Secondary | ICD-10-CM

## 2015-06-14 DIAGNOSIS — I10 Essential (primary) hypertension: Secondary | ICD-10-CM | POA: Diagnosis not present

## 2015-06-14 LAB — CBC WITH DIFFERENTIAL/PLATELET
BASOS ABS: 0 10*3/uL (ref 0.0–0.1)
Basophils Relative: 0.4 % (ref 0.0–3.0)
Eosinophils Absolute: 0.1 10*3/uL (ref 0.0–0.7)
Eosinophils Relative: 2.3 % (ref 0.0–5.0)
HEMATOCRIT: 42.8 % (ref 39.0–52.0)
Hemoglobin: 14.7 g/dL (ref 13.0–17.0)
LYMPHS ABS: 1.4 10*3/uL (ref 0.7–4.0)
Lymphocytes Relative: 24.5 % (ref 12.0–46.0)
MCHC: 34.4 g/dL (ref 30.0–36.0)
MCV: 92.1 fl (ref 78.0–100.0)
MONOS PCT: 9.6 % (ref 3.0–12.0)
Monocytes Absolute: 0.5 10*3/uL (ref 0.1–1.0)
NEUTROS PCT: 63.2 % (ref 43.0–77.0)
Neutro Abs: 3.5 10*3/uL (ref 1.4–7.7)
Platelets: 123 10*3/uL — ABNORMAL LOW (ref 150.0–400.0)
RBC: 4.65 Mil/uL (ref 4.22–5.81)
RDW: 13.1 % (ref 11.5–15.5)
WBC: 5.6 10*3/uL (ref 4.0–10.5)

## 2015-06-14 LAB — CK: Total CK: 151 U/L (ref 7–232)

## 2015-06-14 LAB — LIPID PANEL
CHOL/HDL RATIO: 3
Cholesterol: 107 mg/dL (ref 0–200)
HDL: 36.6 mg/dL — AB (ref >39.00)
LDL Cholesterol: 51 mg/dL (ref 0–99)
NonHDL: 70.4
Triglycerides: 98 mg/dL (ref 0.0–149.0)
VLDL: 19.6 mg/dL (ref 0.0–40.0)

## 2015-06-14 LAB — HEPATIC FUNCTION PANEL
ALK PHOS: 114 U/L (ref 39–117)
ALT: 23 U/L (ref 0–53)
AST: 23 U/L (ref 0–37)
Albumin: 3.9 g/dL (ref 3.5–5.2)
BILIRUBIN TOTAL: 0.6 mg/dL (ref 0.2–1.2)
Bilirubin, Direct: 0.2 mg/dL (ref 0.0–0.3)
Total Protein: 7 g/dL (ref 6.0–8.3)

## 2015-06-14 LAB — BASIC METABOLIC PANEL
BUN: 15 mg/dL (ref 6–23)
CO2: 29 meq/L (ref 19–32)
Calcium: 9.4 mg/dL (ref 8.4–10.5)
Chloride: 106 mEq/L (ref 96–112)
Creatinine, Ser: 1.13 mg/dL (ref 0.40–1.50)
GFR: 80.88 mL/min (ref >60.00)
GLUCOSE: 97 mg/dL (ref 70–99)
Potassium: 4.4 mEq/L (ref 3.5–5.1)
SODIUM: 142 meq/L (ref 135–145)

## 2015-06-14 LAB — HEMOGLOBIN A1C: HEMOGLOBIN A1C: 5.3 % (ref 4.6–6.5)

## 2015-06-14 LAB — TSH: TSH: 2.1 u[IU]/mL (ref 0.35–4.50)

## 2015-06-14 NOTE — Assessment & Plan Note (Signed)
Blood pressure goals reviewed. BMET 

## 2015-06-14 NOTE — Assessment & Plan Note (Signed)
Lipids, LFTs, TSH ,CK 

## 2015-06-14 NOTE — Progress Notes (Signed)
Pre visit review using our clinic review tool, if applicable. No additional management support is needed unless otherwise documented below in the visit note. 

## 2015-06-14 NOTE — Assessment & Plan Note (Signed)
CBC Colonoscopy to be scheduled

## 2015-06-14 NOTE — Patient Instructions (Signed)
Reflux of gastric acid may be asymptomatic as this may occur mainly during sleep.The triggers for reflux  include stress; the "aspirin family" ; alcohol; peppermint; and caffeine (coffee, tea, cola, and chocolate). The aspirin family would include aspirin and the nonsteroidal agents such as ibuprofen &  Naproxen. Tylenol would not cause reflux. If having symptoms ; food & drink should be avoided for @ least 2 hours before going to bed.  Please perform isometric exercises before going to bed. Sit on side of the bed and raise up on toes to a count of 5. Then put pressure on the heels to a count of 5. Repeat this process 10 times. This will improve blood flow to the calves & help prevent cramps.   Your next office appointment will be determined based upon review of your pending labs   Those written interpretation of the lab results and instructions will be transmitted to you by mail for your records.  Critical results will be called.   Followup as needed for any active or acute issue. Please report any significant change in your symptoms.  Colonoscopy due ;please schedule.

## 2015-06-14 NOTE — Assessment & Plan Note (Signed)
CBC & dif  Anti reflux measures PPI pre b'fast

## 2015-06-14 NOTE — Progress Notes (Signed)
   Subjective:    Patient ID: Alvin Ingram, male    DOB: 31-May-1936, 79 y.o.   MRN: [id number]  HPI  The patient is here to assess status of active health conditions.  PMH, FH, & Social History reviewed & updated.  He has been compliant with medicines without adverse effects;but he does describe some muscle cramps in his legs mainly at night. He is on a statin.  He has nocturia 2-3 times per night. He saw his Urologist in June. No definite diagnosis or intervention was made.  He has occasional epigastric burning in the mornings before getting up. He has elevated the head of his bed or altered position. This is usually related to some dietary indiscretion.  He does eat fruits and vegetables and red meat 2-3 times per week. He occasionally eats fried food. He restricts salt at home but does eat out.  He's exercising as walking 3-4 days per week for 30 minutes without cardio pulmonary symptoms. He also does yard work several times per week.  Colonoscopy is to be scheduled the near future. He has no active GI symptoms.   Review of Systems  Chest pain, palpitations, tachycardia, exertional dyspnea, paroxysmal nocturnal dyspnea, claudication or edema are absent. No unexplained weight loss, abdominal pain, significant dyspepsia, dysphagia, melena, rectal bleeding, or persistently small caliber stools. Dysuria, pyuria, hematuria, frequency, or polyuria are denied. Change in hair, skin, nails denied. No bowel changes of constipation or diarrhea. No intolerance to heat or cold.     Objective:   Physical Exam Pertinent or positive findings include: Pattern alopecia and mustache are present. He has upper and lower partials. Dorsalis pedis pulses are slightly decreased. He exhibits flexion contracture of the fingers bilaterally. He has slight crepitus of the knees, left greater than right. Knee deep tendon reflexes are 0-1/2+.  General appearance :adequately nourished; in no distress.  Eyes: No  conjunctival inflammation or scleral icterus is present.  Oral exam:  Lips and gums are healthy appearing.There is no oropharyngeal erythema or exudate noted.   Heart:  Normal rate and regular rhythm. S1 and S2 normal without gallop, murmur, click, rub or other extra sounds    Lungs:Chest clear to auscultation; no wheezes, rhonchi,rales ,or rubs present.No increased work of breathing.   Abdomen: bowel sounds normal, soft and non-tender without masses, organomegaly or hernias noted.  No guarding or rebound.   Genitourinary exam deferred to his Urologist.  Vascular : all pulses equal ; no bruits present.  Skin:Warm & dry.  Intact without suspicious lesions or rashes ; no tenting   Lymphatic: No lymphadenopathy is noted about the head, neck, axilla  Neuro: Strength, tone normal.        Assessment & Plan:  See Current Assessment & Plan in Problem List under specific Diagnosis

## 2015-06-23 ENCOUNTER — Telehealth: Payer: Self-pay | Admitting: Internal Medicine

## 2015-06-23 ENCOUNTER — Other Ambulatory Visit: Payer: Self-pay | Admitting: Emergency Medicine

## 2015-06-23 MED ORDER — OMEPRAZOLE 20 MG PO CPDR: DELAYED_RELEASE_CAPSULE | ORAL | Status: DC

## 2015-06-23 NOTE — Telephone Encounter (Signed)
Patient was in last week for his physical and his prescription for omeprazole (PRILOSEC) 20 MG capsule [id number] was not called in. Pharmacy [facility] on [address]

## 2015-06-23 NOTE — Telephone Encounter (Signed)
Patient has called back in regards.  Patient states he is out of this med.  Patient states he was told his script was suppose to be sent when he was last in office.

## 2015-06-24 ENCOUNTER — Other Ambulatory Visit: Payer: Self-pay | Admitting: Internal Medicine

## 2015-06-25 ENCOUNTER — Other Ambulatory Visit: Payer: Self-pay

## 2015-06-25 MED ORDER — SIMVASTATIN 20 MG PO TABS: 20.0000 mg | ORAL_TABLET | Freq: Every day | ORAL | Status: DC

## 2015-07-11 ENCOUNTER — Other Ambulatory Visit: Payer: Self-pay | Admitting: Internal Medicine

## 2015-07-12 ENCOUNTER — Telehealth: Payer: Self-pay

## 2015-07-12 ENCOUNTER — Other Ambulatory Visit: Payer: Self-pay | Admitting: Internal Medicine

## 2015-07-12 ENCOUNTER — Other Ambulatory Visit: Payer: Self-pay | Admitting: Emergency Medicine

## 2015-07-12 MED ORDER — DOXAZOSIN MESYLATE 8 MG PO TABS: 8.0000 mg | ORAL_TABLET | Freq: Every day | ORAL | Status: DC

## 2015-07-12 NOTE — Telephone Encounter (Signed)
Patient called to educate on Medicare Wellness apt. LVM for the patient to call back to educate and schedule for wellness visit.   

## 2015-08-13 ENCOUNTER — Ambulatory Visit (INDEPENDENT_AMBULATORY_CARE_PROVIDER_SITE_OTHER): Payer: Medicare HMO

## 2015-08-13 DIAGNOSIS — Z23 Encounter for immunization: Secondary | ICD-10-CM

## 2015-09-09 DIAGNOSIS — K5730 Diverticulosis of large intestine without perforation or abscess without bleeding: Secondary | ICD-10-CM | POA: Diagnosis not present

## 2015-09-09 DIAGNOSIS — K648 Other hemorrhoids: Secondary | ICD-10-CM | POA: Diagnosis not present

## 2015-09-09 DIAGNOSIS — Z09 Encounter for follow-up examination after completed treatment for conditions other than malignant neoplasm: Secondary | ICD-10-CM | POA: Diagnosis not present

## 2015-09-09 DIAGNOSIS — Z1211 Encounter for screening for malignant neoplasm of colon: Secondary | ICD-10-CM | POA: Diagnosis not present

## 2015-09-09 DIAGNOSIS — Z86010 Personal history of colonic polyps: Secondary | ICD-10-CM | POA: Diagnosis not present

## 2015-09-09 DIAGNOSIS — K641 Second degree hemorrhoids: Secondary | ICD-10-CM | POA: Diagnosis not present

## 2015-09-28 ENCOUNTER — Other Ambulatory Visit: Payer: Self-pay | Admitting: Internal Medicine

## 2015-10-07 DIAGNOSIS — Z0100 Encounter for examination of eyes and vision without abnormal findings: Secondary | ICD-10-CM | POA: Diagnosis not present

## 2015-10-07 DIAGNOSIS — I10 Essential (primary) hypertension: Secondary | ICD-10-CM | POA: Diagnosis not present

## 2015-10-07 DIAGNOSIS — H35033 Hypertensive retinopathy, bilateral: Secondary | ICD-10-CM | POA: Diagnosis not present

## 2015-10-13 DIAGNOSIS — K641 Second degree hemorrhoids: Secondary | ICD-10-CM | POA: Diagnosis not present

## 2015-10-20 ENCOUNTER — Ambulatory Visit (INDEPENDENT_AMBULATORY_CARE_PROVIDER_SITE_OTHER): Payer: Medicare HMO

## 2015-10-20 DIAGNOSIS — Z23 Encounter for immunization: Secondary | ICD-10-CM

## 2015-10-24 ENCOUNTER — Other Ambulatory Visit: Payer: Self-pay | Admitting: Internal Medicine

## 2015-11-03 DIAGNOSIS — K641 Second degree hemorrhoids: Secondary | ICD-10-CM | POA: Diagnosis not present

## 2015-11-17 DIAGNOSIS — K641 Second degree hemorrhoids: Secondary | ICD-10-CM | POA: Diagnosis not present

## 2016-01-01 ENCOUNTER — Other Ambulatory Visit: Payer: Self-pay | Admitting: Internal Medicine

## 2016-04-17 ENCOUNTER — Other Ambulatory Visit: Payer: Self-pay | Admitting: Internal Medicine

## 2016-04-25 ENCOUNTER — Telehealth: Payer: Self-pay | Admitting: Internal Medicine

## 2016-04-25 NOTE — Telephone Encounter (Signed)
Has questions in regards to premed?

## 2016-04-26 ENCOUNTER — Encounter: Payer: Self-pay | Admitting: Emergency Medicine

## 2016-04-26 NOTE — Telephone Encounter (Signed)
LVM to have office call back.

## 2016-04-26 NOTE — Telephone Encounter (Signed)
I am not sure if he needs this or not - there is nothing in his chart to indicate if it is needed.  If it is just for a heart murmur he does not need it.

## 2016-04-26 NOTE — Telephone Encounter (Signed)
Letter sent to Pts dentist office

## 2016-04-26 NOTE — Telephone Encounter (Signed)
Pt is having Dental cleaning done tomorrow with Dr Mervyn Skeeters, they need to know if Pt needs needs antibiotic before cleaning. States pt has taken it in the past for Heart Murmur. Amoxicillin is on pts current med list.   Will fax letter with response to [facility] at [phone]

## 2016-05-08 DIAGNOSIS — R351 Nocturia: Secondary | ICD-10-CM | POA: Diagnosis not present

## 2016-05-08 DIAGNOSIS — N401 Enlarged prostate with lower urinary tract symptoms: Secondary | ICD-10-CM | POA: Diagnosis not present

## 2016-05-08 DIAGNOSIS — Z125 Encounter for screening for malignant neoplasm of prostate: Secondary | ICD-10-CM | POA: Diagnosis not present

## 2016-06-10 ENCOUNTER — Other Ambulatory Visit: Payer: Self-pay | Admitting: Internal Medicine

## 2016-06-14 ENCOUNTER — Encounter: Payer: Self-pay | Admitting: Internal Medicine

## 2016-06-14 ENCOUNTER — Ambulatory Visit (INDEPENDENT_AMBULATORY_CARE_PROVIDER_SITE_OTHER): Payer: Medicare HMO | Admitting: Internal Medicine

## 2016-06-14 ENCOUNTER — Other Ambulatory Visit (INDEPENDENT_AMBULATORY_CARE_PROVIDER_SITE_OTHER): Payer: Medicare HMO

## 2016-06-14 VITALS — BP 184/92 | HR 68 | Temp 98.3°F | Resp 16 | Ht 73.5 in | Wt 193.0 lb

## 2016-06-14 DIAGNOSIS — Z0000 Encounter for general adult medical examination without abnormal findings: Secondary | ICD-10-CM

## 2016-06-14 DIAGNOSIS — I10 Essential (primary) hypertension: Secondary | ICD-10-CM

## 2016-06-14 DIAGNOSIS — E785 Hyperlipidemia, unspecified: Secondary | ICD-10-CM

## 2016-06-14 DIAGNOSIS — K219 Gastro-esophageal reflux disease without esophagitis: Secondary | ICD-10-CM

## 2016-06-14 DIAGNOSIS — D696 Thrombocytopenia, unspecified: Secondary | ICD-10-CM

## 2016-06-14 LAB — CBC WITH DIFFERENTIAL/PLATELET
BASOS PCT: 0.4 % (ref 0.0–3.0)
Basophils Absolute: 0 10*3/uL (ref 0.0–0.1)
EOS ABS: 0.1 10*3/uL (ref 0.0–0.7)
Eosinophils Relative: 2.2 % (ref 0.0–5.0)
HCT: 43.7 % (ref 39.0–52.0)
HEMOGLOBIN: 14.9 g/dL (ref 13.0–17.0)
Lymphocytes Relative: 25.2 % (ref 12.0–46.0)
Lymphs Abs: 1.2 10*3/uL (ref 0.7–4.0)
MCHC: 34 g/dL (ref 30.0–36.0)
MCV: 92.3 fl (ref 78.0–100.0)
MONO ABS: 0.4 10*3/uL (ref 0.1–1.0)
Monocytes Relative: 9 % (ref 3.0–12.0)
NEUTROS ABS: 3 10*3/uL (ref 1.4–7.7)
NEUTROS PCT: 63.2 % (ref 43.0–77.0)
PLATELETS: 127 10*3/uL — AB (ref 150.0–400.0)
RBC: 4.73 Mil/uL (ref 4.22–5.81)
RDW: 13.3 % (ref 11.5–15.5)
WBC: 4.8 10*3/uL (ref 4.0–10.5)

## 2016-06-14 LAB — LIPID PANEL
CHOL/HDL RATIO: 3
Cholesterol: 103 mg/dL (ref 0–200)
HDL: 32.8 mg/dL — ABNORMAL LOW (ref >39.00)
LDL CALC: 51 mg/dL (ref 0–99)
NonHDL: 70.61
Triglycerides: 98 mg/dL (ref 0.0–149.0)
VLDL: 19.6 mg/dL (ref 0.0–40.0)

## 2016-06-14 LAB — COMPREHENSIVE METABOLIC PANEL
ALT: 26 U/L (ref 0–53)
AST: 26 U/L (ref 0–37)
Albumin: 4.5 g/dL (ref 3.5–5.2)
Alkaline Phosphatase: 120 U/L — ABNORMAL HIGH (ref 39–117)
BUN: 14 mg/dL (ref 6–23)
CHLORIDE: 104 meq/L (ref 96–112)
CO2: 30 meq/L (ref 19–32)
Calcium: 9.4 mg/dL (ref 8.4–10.5)
Creatinine, Ser: 1.17 mg/dL (ref 0.40–1.50)
GFR: 77.5 mL/min (ref >60.00)
GLUCOSE: 106 mg/dL — AB (ref 70–99)
Potassium: 4.4 mEq/L (ref 3.5–5.1)
SODIUM: 141 meq/L (ref 135–145)
Total Bilirubin: 0.5 mg/dL (ref 0.2–1.2)
Total Protein: 7.3 g/dL (ref 6.0–8.3)

## 2016-06-14 LAB — TSH: TSH: 2.05 u[IU]/mL (ref 0.35–4.50)

## 2016-06-14 MED ORDER — LOSARTAN POTASSIUM 25 MG PO TABS: 25.0000 mg | ORAL_TABLET | Freq: Every day | ORAL | Status: DC

## 2016-06-14 NOTE — Assessment & Plan Note (Signed)
GERD controlled Continue daily medication - omeprazole 

## 2016-06-14 NOTE — Patient Instructions (Addendum)
Alvin Ingram , Thank you for taking time to come for your Medicare Wellness Visit. I appreciate your ongoing commitment to your health goals. Please review the following plan we discussed and let me know if I can assist you in the future.   These are the goals we discussed: Goals    Monitor your BP at home - goal less than 150/90.  Continue regular exercise      This is a list of the screening recommended for you and due dates:  Health Maintenance  Topic Date Due  . Flu Shot  06/27/2016  . Tetanus Vaccine  06/10/2023  . Shingles Vaccine  Completed  . Pneumonia vaccines  Completed   Health Maintenance, Male A healthy lifestyle and preventative care can promote health and wellness.  Maintain regular health, dental, and eye exams.  Eat a healthy diet. Foods like vegetables, fruits, whole grains, low-fat dairy products, and lean protein foods contain the nutrients you need and are low in calories. Decrease your intake of foods high in solid fats, added sugars, and salt. Get information about a proper diet from your health care provider, if necessary.  Regular physical exercise is one of the most important things you can do for your health. Most adults should get at least 150 minutes of moderate-intensity exercise (any activity that increases your heart rate and causes you to sweat) each week. In addition, most adults need muscle-strengthening exercises on 2 or more days a week.   Maintain a healthy weight. The body mass index (BMI) is a screening tool to identify possible weight problems. It provides an estimate of body fat based on height and weight. Your health care provider can find your BMI and can help you achieve or maintain a healthy weight. For males 20 years and older:  A BMI below 18.5 is considered underweight.  A BMI of 18.5 to 24.9 is normal.  A BMI of 25 to 29.9 is considered overweight.  A BMI of 30 and above is considered obese.  Maintain normal blood lipids and  cholesterol by exercising and minimizing your intake of saturated fat. Eat a balanced diet with plenty of fruits and vegetables. Blood tests for lipids and cholesterol should begin at age 59 and be repeated every 5 years. If your lipid or cholesterol levels are high, you are over age 38, or you are at high risk for heart disease, you may need your cholesterol levels checked more frequently.Ongoing high lipid and cholesterol levels should be treated with medicines if diet and exercise are not working.  If you smoke, find out from your health care provider how to quit. If you do not use tobacco, do not start.  Lung cancer screening is recommended for adults aged 43-80 years who are at high risk for developing lung cancer because of a history of smoking. A yearly low-dose CT scan of the lungs is recommended for people who have at least a 30-pack-year history of smoking and are current smokers or have quit within the past 15 years. A pack year of smoking is smoking an average of 1 pack of cigarettes a day for 1 year (for example, a 30-pack-year history of smoking could mean smoking 1 pack a day for 30 years or 2 packs a day for 15 years). Yearly screening should continue until the smoker has stopped smoking for at least 15 years. Yearly screening should be stopped for people who develop a health problem that would prevent them from having lung cancer treatment.  If you choose to drink alcohol, do not have more than 2 drinks per day. One drink is considered to be 12 oz (360 mL) of beer, 5 oz (150 mL) of wine, or 1.5 oz (45 mL) of liquor.  Avoid the use of street drugs. Do not share needles with anyone. Ask for help if you need support or instructions about stopping the use of drugs.  High blood pressure causes heart disease and increases the risk of stroke. High blood pressure is more likely to develop in:  People who have blood pressure in the end of the normal range (100-139/85-89 mm Hg).  People who are  overweight or obese.  People who are African American.  If you are 84-10 years of age, have your blood pressure checked every 3-5 years. If you are 27 years of age or older, have your blood pressure checked every year. You should have your blood pressure measured twice--once when you are at a hospital or clinic, and once when you are not at a hospital or clinic. Record the average of the two measurements. To check your blood pressure when you are not at a hospital or clinic, you can use:  An automated blood pressure machine at a pharmacy.  A home blood pressure monitor.  If you are 55-68 years old, ask your health care provider if you should take aspirin to prevent heart disease.  Diabetes screening involves taking a blood sample to check your fasting blood sugar level. This should be done once every 3 years after age 29 if you are at a normal weight and without risk factors for diabetes. Testing should be considered at a younger age or be carried out more frequently if you are overweight and have at least 1 risk factor for diabetes.  Colorectal cancer can be detected and often prevented. Most routine colorectal cancer screening begins at the age of 12 and continues through age 20. However, your health care provider may recommend screening at an earlier age if you have risk factors for colon cancer. On a yearly basis, your health care provider may provide home test kits to check for hidden blood in the stool. A small camera at the end of a tube may be used to directly examine the colon (sigmoidoscopy or colonoscopy) to detect the earliest forms of colorectal cancer. Talk to your health care provider about this at age 80 when routine screening begins. A direct exam of the colon should be repeated every 5-10 years through age 51, unless early forms of precancerous polyps or small growths are found.  People who are at an increased risk for hepatitis B should be screened for this virus. You are  considered at high risk for hepatitis B if:  You were born in a country where hepatitis B occurs often. Talk with your health care provider about which countries are considered high risk.  Your parents were born in a high-risk country and you have not received a shot to protect against hepatitis B (hepatitis B vaccine).  You have HIV or AIDS.  You use needles to inject street drugs.  You live with, or have sex with, someone who has hepatitis B.  You are a man who has sex with other men (MSM).  You get hemodialysis treatment.  You take certain medicines for conditions like cancer, organ transplantation, and autoimmune conditions.  Hepatitis C blood testing is recommended for all people born from [birth year] through 1963 and any individual with known risk factors for hepatitis C.  Healthy men should no longer receive prostate-specific antigen (PSA) blood tests as part of routine cancer screening. Talk to your health care provider about prostate cancer screening.  Testicular cancer screening is not recommended for adolescents or adult males who have no symptoms. Screening includes self-exam, a health care provider exam, and other screening tests. Consult with your health care provider about any symptoms you have or any concerns you have about testicular cancer.  Practice safe sex. Use condoms and avoid high-risk sexual practices to reduce the spread of sexually transmitted infections (STIs).  You should be screened for STIs, including gonorrhea and chlamydia if:  You are sexually active and are younger than 24 years.  You are older than 24 years, and your health care provider tells you that you are at risk for this type of infection.  Your sexual activity has changed since you were last screened, and you are at an increased risk for chlamydia or gonorrhea. Ask your health care provider if you are at risk.  If you are at risk of being infected with HIV, it is recommended that you take a  prescription medicine daily to prevent HIV infection. This is called pre-exposure prophylaxis (PrEP). You are considered at risk if:  You are a man who has sex with other men (MSM).  You are a heterosexual man who is sexually active with multiple partners.  You take drugs by injection.  You are sexually active with a partner who has HIV.  Talk with your health care provider about whether you are at high risk of being infected with HIV. If you choose to begin PrEP, you should first be tested for HIV. You should then be tested every 3 months for as long as you are taking PrEP.  Use sunscreen. Apply sunscreen liberally and repeatedly throughout the day. You should seek shade when your shadow is shorter than you. Protect yourself by wearing long sleeves, pants, a wide-brimmed hat, and sunglasses year round whenever you are outdoors.  Tell your health care provider of new moles or changes in moles, especially if there is a change in shape or color. Also, tell your health care provider if a mole is larger than the size of a pencil eraser.  A one-time screening for abdominal aortic aneurysm (AAA) and surgical repair of large AAAs by ultrasound is recommended for men aged 62-75 years who are current or former smokers.  Stay current with your vaccines (immunizations).   This information is not intended to replace advice given to you by your health care provider. Make sure you discuss any questions you have with your health care provider.   Document Released: 05/11/2008 Document Revised: 12/04/2014 Document Reviewed: 04/10/2011 Elsevier Interactive Patient Education [employer].

## 2016-06-14 NOTE — Progress Notes (Signed)
Subjective:    Patient ID: Alvin Ingram, male    DOB: 05/12/1936, 80 y.o.   MRN: [id number]  HPI He is here to establish with a new pcp.   Here for medicare wellness exam/physical exam.   His blood pressure has been elevated at his urologist.    I have personally reviewed and have noted 1.The patient's medical and social history 2.Their use of alcohol, tobacco or illicit drugs 3.Their current medications and supplements 4.The patient's functional ability including ADL's, fall risks, home safety risks and                 hearing or visual impairment. 5.Diet and physical activities 6.Evidence for depression or mood disorders 7.Care team reviewed and updated - urology - Dr Diona Fanti, dentist, vision - dr Modena Morrow  Lives alone  Are there smokers in your home (other than you)? No  Risk Factors Exercise: yard work, some walking, will start going to gym - silver sneakers Dietary issues discussed: watches what he eats, does not cook much  Cardiac risk factors: advanced age, hypertension, hyperlipidemia  Depression Screen  Have you felt down, depressed or hopeless? No  Have you felt little interest or pleasure in doing things?  No  Activities of Daily Living In your present state of health, do you have any difficulty performing the following activities?:  Driving? No Managing money?  No Feeding yourself? No Getting from bed to chair? No Climbing a flight of stairs? No Preparing food and eating?: No Bathing or showering? No Getting dressed: No Getting to/using the toilet? No Moving around from place to place: No In the past year have you fallen or had a near fall?: No   Are you sexually active?  No  Do you have more than one partner?  N/A  Hearing Difficulties: No - has had hearing evaluated last year - normal Do you often ask people to speak up or repeat themselves? No Do you experience ringing or noises in  your ears? No Do you have difficulty understanding soft or whispered voices? No Vision:              Any change in vision:  no             Up to date with eye exam: yes Memory:  Do you feel that you have a problem with memory? No  Do you often misplace items? No  Do you feel safe at home?  Yes  Cognitive Testing  Alert, Orientated? Yes  Normal Appearance? Yes  Recall of three objects?  Yes  Can perform simple calculations? Yes  Displays appropriate judgment? Yes  Can read the correct time from a watch face? Yes   Advanced Directives have been discussed with the patient? Yes  Medications and allergies reviewed with patient and updated if appropriate.  Patient Active Problem List   Diagnosis Date Noted  . HYPERTROPHY PROSTATE W/O UR OBST & OTH LUTS 05/31/2010  . THROMBOCYTOPENIA, CHRONIC 05/24/2009  . PREMATURE ATRIAL CONTRACTIONS 05/24/2009  . GERD 05/21/2008  . Nocturia 05/21/2008  . Hyperlipidemia 04/23/2007  . Essential hypertension 04/23/2007  . CARDIAC MURMUR, AORTIC 03/15/2007  . ALKALINE PHOSPHATASE, ELEVATED 03/15/2007  . History of colonic polyps 03/15/2007    Current Outpatient Prescriptions on File Prior to Visit  Medication Sig Dispense Refill  . amitriptyline (ELAVIL) 25 MG tablet Take 25 mg by mouth at bedtime.    Marland Kitchen aspirin 81 MG tablet Take 81 mg by mouth daily.      Marland Kitchen  doxazosin (CARDURA) 8 MG tablet Take 1 tablet (8 mg total) by mouth daily. Keep July appt for future refills 90 tablet 1  . Flaxseed, Linseed, (FLAX SEED OIL PO) Take 1,200 mg by mouth daily.      . metoprolol succinate (TOPROL-XL) 100 MG 24 hr tablet TAKE 1 TABLET BY MOUTH EVERY DAY WITH OR IMMEDIATELY FOLLOWING A MEAL 90 tablet 3  . Multiple Vitamins-Minerals (CENTRUM SILVER PO) Take by mouth daily.      . Omega-3 Fatty Acids (FISH OIL) 1200 MG CAPS Take 1,200 mg by mouth daily.      Marland Kitchen omeprazole (PRILOSEC) 20 MG capsule Take 1 capsule (20 mg total) by mouth daily. Keep upcoming appt w/new MD  for refills 90 capsule 0  . simvastatin (ZOCOR) 20 MG tablet TAKE 1 TABLET BY MOUTH AT BEDTIME 90 tablet 1   No current facility-administered medications on file prior to visit.    Past Medical History  Diagnosis Date  . History of colonic polyps 2003,2011    Dr Earlean Shawl  . Hypertension     hypertensive response @ stress test  . Cardiac murmur     Aortic  . GERD (gastroesophageal reflux disease)   . Hyperlipidemia   . History of syncope 1966    ? stress related  . Diverticulosis 2006    Past Surgical History  Procedure Laterality Date  . Colonoscopy  04/2005    tics, no polyps. Dr.Medoff  . Colonoscopy w/ polypectomy  2003, 2011    neg 2006; due 2016. Dr Earlean Shawl  . Cataract extraction  2003 & 2011    Dr Charise Killian    Social History   Social History  . Marital Status: Single    Spouse Name: N/A  . Number of Children: N/A  . Years of Education: N/A   Social History Main Topics  . Smoking status: Former Smoker    Quit date: 11/28/1967  . Smokeless tobacco: None     Comment: smoked 1955-1969 , up to 1 ppd  . Alcohol Use: Yes     Comment: RARELY  . Drug Use: No  . Sexual Activity: Not Asked   Other Topics Concern  . None   Social History Narrative    Family History  Problem Relation Age of Onset  . Heart attack Mother 79  . Colon polyps Brother   . Pneumonia Brother   . Hypertension Brother   . Heart attack Brother 33  . Lung cancer Brother   . Other Father     Benign Brain Tumor  . Tuberculosis Paternal Uncle   . Diabetes Neg Hx   . Stroke Neg Hx   . Other Maternal Grandmother     Review of Systems  Constitutional: Negative for fever, chills and appetite change.  HENT: Negative for hearing loss and tinnitus.   Eyes: Negative for visual disturbance.  Respiratory: Negative for cough, shortness of breath and wheezing.   Cardiovascular: Positive for palpitations (rare). Negative for chest pain and leg swelling.  Gastrointestinal: Positive for constipation  (controlled with benefiber). Negative for nausea, abdominal pain, diarrhea and blood in stool.       Gerd controlled  Endocrine: Negative for polydipsia and polyuria.  Genitourinary: Positive for frequency. Negative for dysuria and hematuria.  Musculoskeletal: Positive for arthralgias (knees with sleeping, shoulders). Negative for myalgias and back pain.  Skin: Negative for color change and rash.  Neurological: Negative for dizziness, light-headedness and headaches.  Psychiatric/Behavioral: Positive for sleep disturbance (difficulty getting back to sleep  after going to the bathrrom - urology prescribes elavil). Negative for dysphoric mood. The patient is not nervous/anxious.        Objective:   Filed Vitals:   06/14/16 1027  BP: 184/92  Pulse: 68  Temp: 98.3 F (36.8 C)  Resp: 16   Filed Weights   06/14/16 1027  Weight: 193 lb (87.544 kg)   Body mass index is 25.12 kg/(m^2).   Physical Exam Constitutional: He appears well-developed and well-nourished. No distress.  HENT:  Head: Normocephalic and atraumatic.  Right Ear: External ear normal.  Left Ear: External ear normal.  Mouth/Throat: Oropharynx is clear and moist.  Normal ear canals and TM b/l  Eyes: Conjunctivae and EOM are normal.  Neck: Neck supple. No tracheal deviation present. No thyromegaly present.  No carotid bruit  Cardiovascular: Normal rate, regular rhythm, normal heart sounds and intact distal pulses.   2/6 systolic murmur heard. Pulmonary/Chest: Effort normal and breath sounds normal. No respiratory distress. He has no wheezes. He has no rales.  Abdominal: Soft. Bowel sounds are normal. He exhibits no distension. There is no tenderness.  Genitourinary:  deferred to urology Musculoskeletal: He exhibits no edema.  Lymphadenopathy:    He has no cervical adenopathy.  Skin: Skin is warm and dry. He is not diaphoretic.  Psychiatric: He has a normal mood and affect. His behavior is normal.          Assessment & Plan:   Wellness Exam: Immunizations  Up to date  Colonoscopy  No longer needed at his age Eye exam  Up to date  Hearing loss - none, tested last year Memory concerns/difficulties - none Independent of ADLs - fully Sees dentist twice a year Stressed the importance of regular exercise   Patient received copy of preventative screening tests/immunizations recommended for the next 5-10 years.   Physical exam: Screening blood work   ordered Immunizations  Up to date  Colonoscopy  No longer needed at his age Eye exams  Up to date  Exercise - walking for exercise, plans on doing silver sneakers at the gym Weight - BMI normal Skin - no concerns   See Problem List for Assessment and Plan of chronic medical problems.

## 2016-06-14 NOTE — Assessment & Plan Note (Signed)
Lipid has been well controlled Check lipid panel Continue simvastatin 20 mg dialy

## 2016-06-14 NOTE — Assessment & Plan Note (Signed)
Stable, check cbc

## 2016-06-14 NOTE — Progress Notes (Signed)
Pre visit review using our clinic review tool, if applicable. No additional management support is needed unless otherwise documented below in the visit note. 

## 2016-06-14 NOTE — Assessment & Plan Note (Signed)
BP elevated at urology and here today continue current medications Start losartan 25 mg daily Get BP cuff and start monitoring BP at home Follow up in 1 month to recheck bp

## 2016-06-27 ENCOUNTER — Other Ambulatory Visit: Payer: Self-pay | Admitting: Internal Medicine

## 2016-06-28 ENCOUNTER — Other Ambulatory Visit: Payer: Self-pay | Admitting: Internal Medicine

## 2016-07-12 ENCOUNTER — Other Ambulatory Visit (INDEPENDENT_AMBULATORY_CARE_PROVIDER_SITE_OTHER): Payer: Medicare HMO

## 2016-07-12 ENCOUNTER — Ambulatory Visit (INDEPENDENT_AMBULATORY_CARE_PROVIDER_SITE_OTHER): Payer: Medicare HMO | Admitting: Internal Medicine

## 2016-07-12 ENCOUNTER — Encounter: Payer: Self-pay | Admitting: Internal Medicine

## 2016-07-12 DIAGNOSIS — I10 Essential (primary) hypertension: Secondary | ICD-10-CM | POA: Diagnosis not present

## 2016-07-12 LAB — BASIC METABOLIC PANEL
BUN: 16 mg/dL (ref 6–23)
CALCIUM: 9.4 mg/dL (ref 8.4–10.5)
CHLORIDE: 106 meq/L (ref 96–112)
CO2: 31 meq/L (ref 19–32)
CREATININE: 1.12 mg/dL (ref 0.40–1.50)
GFR: 81.49 mL/min (ref >60.00)
GLUCOSE: 100 mg/dL — AB (ref 70–99)
Potassium: 4.7 mEq/L (ref 3.5–5.1)
Sodium: 141 mEq/L (ref 135–145)

## 2016-07-12 NOTE — Progress Notes (Signed)
Subjective:    Patient ID: Alvin Ingram, male    DOB: Oct 26, 1936, 80 y.o.   MRN: [id number]  HPI He is here for follow up.  Hypertension: He is taking his medication daily. He is compliant with a low sodium diet.  He denies chest pain, edema, shortness of breath and regular headaches. He is not exercising regularly, but will start.  He does monitor his blood pressure at home - 110-150/60-70.     Medications and allergies reviewed with patient and updated if appropriate.  Patient Active Problem List   Diagnosis Date Noted  . HYPERTROPHY PROSTATE W/O UR OBST & OTH LUTS 05/31/2010  . THROMBOCYTOPENIA, CHRONIC 05/24/2009  . PREMATURE ATRIAL CONTRACTIONS 05/24/2009  . GERD 05/21/2008  . Nocturia 05/21/2008  . Hyperlipidemia 04/23/2007  . Essential hypertension 04/23/2007  . CARDIAC MURMUR, AORTIC 03/15/2007  . History of colonic polyps 03/15/2007    Current Outpatient Prescriptions on File Prior to Visit  Medication Sig Dispense Refill  . amitriptyline (ELAVIL) 25 MG tablet Take 25 mg by mouth at bedtime.    Marland Kitchen aspirin 81 MG tablet Take 81 mg by mouth daily.      Marland Kitchen doxazosin (CARDURA) 8 MG tablet Take 1 tablet (8 mg total) by mouth daily. 90 tablet 1  . Flaxseed, Linseed, (FLAX SEED OIL PO) Take 1,200 mg by mouth daily.      Marland Kitchen losartan (COZAAR) 25 MG tablet Take 1 tablet (25 mg total) by mouth daily. 30 tablet 5  . metoprolol succinate (TOPROL-XL) 100 MG 24 hr tablet TAKE 1 TABLET BY MOUTH EVERY DAY WITH OR IMMEDIATELY FOLLOWING A MEAL 90 tablet 3  . Multiple Vitamins-Minerals (CENTRUM SILVER PO) Take by mouth daily.      . Omega-3 Fatty Acids (FISH OIL) 1200 MG CAPS Take 1,200 mg by mouth daily.      Marland Kitchen omeprazole (PRILOSEC) 20 MG capsule Take 1 capsule (20 mg total) by mouth daily. Keep upcoming appt w/new MD for refills 90 capsule 0  . simvastatin (ZOCOR) 20 MG tablet TAKE 1 TABLET BY MOUTH AT BEDTIME 90 tablet 1   No current facility-administered medications on file prior to  visit.     Past Medical History:  Diagnosis Date  . Cardiac murmur    Aortic  . Diverticulosis 2006  . GERD (gastroesophageal reflux disease)   . History of colonic polyps 2003,2011   Dr Earlean Shawl  . History of syncope 1966   ? stress related  . Hyperlipidemia   . Hypertension    hypertensive response @ stress test    Past Surgical History:  Procedure Laterality Date  . CATARACT EXTRACTION  2003 & 2011   Dr Charise Killian  . COLONOSCOPY  04/2005   tics, no polyps. Dr.Medoff  . COLONOSCOPY W/ POLYPECTOMY  2003, 2011   neg 2006; due 2016. Dr Earlean Shawl    Social History   Social History  . Marital status: Single    Spouse name: N/A  . Number of children: N/A  . Years of education: N/A   Social History Main Topics  . Smoking status: Former Smoker    Quit date: 11/28/1967  . Smokeless tobacco: None     Comment: smoked 1955-1969 , up to 1 ppd  . Alcohol use Yes     Comment: RARELY  . Drug use: No  . Sexual activity: Not Asked   Other Topics Concern  . None   Social History Narrative  . None    Family History  Problem  Relation Age of Onset  . Heart attack Mother 44  . Colon polyps Brother   . Pneumonia Brother   . Hypertension Brother   . Heart attack Brother 65  . Lung cancer Brother   . Other Father     Benign Brain Tumor  . Tuberculosis Paternal Uncle   . Diabetes Neg Hx   . Stroke Neg Hx   . Other Maternal Grandmother     Review of Systems  Constitutional: Negative for fever.  Respiratory: Negative for cough, shortness of breath and wheezing.   Cardiovascular: Positive for palpitations (occ - few beats only). Negative for chest pain and leg swelling.  Neurological: Positive for light-headedness (occ). Negative for headaches.       Objective:   Vitals:   07/12/16 1431 07/12/16 1443  BP: (!) 176/74 (!) 164/80  Pulse:  91  Resp:  16  Temp:  98.4 F (36.9 C)   Filed Weights   07/12/16 1443  Weight: 195 lb (88.5 kg)   Body mass index is 25.38  kg/m.   Physical Exam Constitutional: Appears well-developed and well-nourished. No distress.  HENT:  Head: Normocephalic and atraumatic.  Neck: Neck supple. No tracheal deviation present. No thyromegaly present.  Cardiovascular: Normal rate, regular rhythm and normal heart sounds.   No murmur heard. No carotid bruit  Pulmonary/Chest: Effort normal and breath sounds normal. No respiratory distress. No has no wheezes. No rales.  Musculoskeletal: No edema.  Lymphadenopathy: No cervical adenopathy.  Skin: Skin is warm and dry. Not diaphoretic.        Assessment & Plan:   See Problem List for Assessment and Plan of chronic medical problems.   F/u in one year for a physical

## 2016-07-12 NOTE — Progress Notes (Signed)
Pre visit review using our clinic review tool, if applicable. No additional management support is needed unless otherwise documented below in the visit note. 

## 2016-07-12 NOTE — Assessment & Plan Note (Signed)
BP well controlled at home - he brought his log Current regimen effective and well tolerated Continue current medications at current doses

## 2016-07-12 NOTE — Patient Instructions (Addendum)
Blood work today.   Medications reviewed and updated.  No changes recommended at this time.   Please followup in one year

## 2016-07-22 ENCOUNTER — Other Ambulatory Visit: Payer: Self-pay | Admitting: Internal Medicine

## 2016-07-26 ENCOUNTER — Telehealth: Payer: Self-pay | Admitting: Internal Medicine

## 2016-07-26 ENCOUNTER — Other Ambulatory Visit: Payer: Self-pay | Admitting: Internal Medicine

## 2016-07-26 NOTE — Telephone Encounter (Signed)
RX has been sent to the pharmacy.

## 2016-07-26 NOTE — Telephone Encounter (Signed)
Patient is requesting refill on metoprolol to be sent to [facility] on [address].

## 2016-08-01 ENCOUNTER — Ambulatory Visit (INDEPENDENT_AMBULATORY_CARE_PROVIDER_SITE_OTHER): Payer: Medicare HMO

## 2016-08-01 DIAGNOSIS — Z23 Encounter for immunization: Secondary | ICD-10-CM | POA: Diagnosis not present

## 2016-09-07 ENCOUNTER — Other Ambulatory Visit: Payer: Self-pay | Admitting: Internal Medicine

## 2016-10-08 ENCOUNTER — Other Ambulatory Visit: Payer: Self-pay | Admitting: Internal Medicine

## 2016-11-09 DIAGNOSIS — H5200 Hypermetropia, unspecified eye: Secondary | ICD-10-CM | POA: Diagnosis not present

## 2016-11-09 DIAGNOSIS — I10 Essential (primary) hypertension: Secondary | ICD-10-CM | POA: Diagnosis not present

## 2016-11-09 DIAGNOSIS — Z0100 Encounter for examination of eyes and vision without abnormal findings: Secondary | ICD-10-CM | POA: Diagnosis not present

## 2016-12-06 ENCOUNTER — Other Ambulatory Visit: Payer: Self-pay | Admitting: Internal Medicine

## 2016-12-06 MED ORDER — LOSARTAN POTASSIUM 25 MG PO TABS: ORAL_TABLET | ORAL | 1 refills | Status: DC

## 2016-12-06 NOTE — Telephone Encounter (Signed)
Rec'd call from pharmacist stating pt is switching pharmacy from [facility] to [facility] on [address]. A request has been sent from [facility] on pt Losartan making sure rx is sent back to [facility]. Rec'd escript updated pharmacy &  sent back to [place]...Johny Chess

## 2016-12-19 ENCOUNTER — Other Ambulatory Visit: Payer: Self-pay | Admitting: Internal Medicine

## 2016-12-21 ENCOUNTER — Other Ambulatory Visit: Payer: Self-pay | Admitting: Emergency Medicine

## 2016-12-21 MED ORDER — DOXAZOSIN MESYLATE 8 MG PO TABS: 8.0000 mg | ORAL_TABLET | Freq: Every day | ORAL | 1 refills | Status: DC

## 2016-12-21 NOTE — Telephone Encounter (Signed)
Pt is switching pharmacies, new one is [facility] on [address] [phone]

## 2016-12-21 NOTE — Telephone Encounter (Signed)
Noted  

## 2017-03-23 ENCOUNTER — Other Ambulatory Visit: Payer: Self-pay | Admitting: Internal Medicine

## 2017-06-19 ENCOUNTER — Other Ambulatory Visit: Payer: Self-pay | Admitting: Internal Medicine

## 2017-07-12 ENCOUNTER — Ambulatory Visit (INDEPENDENT_AMBULATORY_CARE_PROVIDER_SITE_OTHER): Payer: Medicare HMO | Admitting: Internal Medicine

## 2017-07-12 ENCOUNTER — Other Ambulatory Visit (INDEPENDENT_AMBULATORY_CARE_PROVIDER_SITE_OTHER): Payer: Medicare HMO

## 2017-07-12 ENCOUNTER — Encounter: Payer: Self-pay | Admitting: Internal Medicine

## 2017-07-12 VITALS — BP 152/80 | HR 82 | Temp 98.3°F | Resp 16 | Wt 187.0 lb

## 2017-07-12 DIAGNOSIS — K219 Gastro-esophageal reflux disease without esophagitis: Secondary | ICD-10-CM | POA: Diagnosis not present

## 2017-07-12 DIAGNOSIS — Z0000 Encounter for general adult medical examination without abnormal findings: Secondary | ICD-10-CM | POA: Diagnosis not present

## 2017-07-12 DIAGNOSIS — N400 Enlarged prostate without lower urinary tract symptoms: Secondary | ICD-10-CM

## 2017-07-12 DIAGNOSIS — E7800 Pure hypercholesterolemia, unspecified: Secondary | ICD-10-CM | POA: Diagnosis not present

## 2017-07-12 DIAGNOSIS — D696 Thrombocytopenia, unspecified: Secondary | ICD-10-CM

## 2017-07-12 DIAGNOSIS — E785 Hyperlipidemia, unspecified: Secondary | ICD-10-CM | POA: Diagnosis not present

## 2017-07-12 DIAGNOSIS — I10 Essential (primary) hypertension: Secondary | ICD-10-CM | POA: Diagnosis not present

## 2017-07-12 LAB — COMPREHENSIVE METABOLIC PANEL
ALBUMIN: 4.1 g/dL (ref 3.5–5.2)
ALK PHOS: 94 U/L (ref 39–117)
ALT: 23 U/L (ref 0–53)
AST: 21 U/L (ref 0–37)
BILIRUBIN TOTAL: 0.7 mg/dL (ref 0.2–1.2)
BUN: 15 mg/dL (ref 6–23)
CALCIUM: 9.5 mg/dL (ref 8.4–10.5)
CO2: 30 meq/L (ref 19–32)
CREATININE: 1.1 mg/dL (ref 0.40–1.50)
Chloride: 104 mEq/L (ref 96–112)
GFR: 82.98 mL/min (ref >60.00)
Glucose, Bld: 100 mg/dL — ABNORMAL HIGH (ref 70–99)
Potassium: 4.1 mEq/L (ref 3.5–5.1)
Sodium: 140 mEq/L (ref 135–145)
Total Protein: 6.5 g/dL (ref 6.0–8.3)

## 2017-07-12 LAB — LIPID PANEL
CHOL/HDL RATIO: 3
CHOLESTEROL: 97 mg/dL (ref 0–200)
HDL: 34.4 mg/dL — AB (ref >39.00)
LDL Cholesterol: 48 mg/dL (ref 0–99)
NonHDL: 63
TRIGLYCERIDES: 73 mg/dL (ref 0.0–149.0)
VLDL: 14.6 mg/dL (ref 0.0–40.0)

## 2017-07-12 LAB — CBC WITH DIFFERENTIAL/PLATELET
BASOS ABS: 0 10*3/uL (ref 0.0–0.1)
Basophils Relative: 0.2 % (ref 0.0–3.0)
Eosinophils Absolute: 0.1 10*3/uL (ref 0.0–0.7)
Eosinophils Relative: 1.5 % (ref 0.0–5.0)
HEMATOCRIT: 40.4 % (ref 39.0–52.0)
HEMOGLOBIN: 14 g/dL (ref 13.0–17.0)
LYMPHS ABS: 1.3 10*3/uL (ref 0.7–4.0)
Lymphocytes Relative: 27.5 % (ref 12.0–46.0)
MCHC: 34.5 g/dL (ref 30.0–36.0)
MCV: 91.8 fl (ref 78.0–100.0)
MONOS PCT: 8.8 % (ref 3.0–12.0)
Monocytes Absolute: 0.4 10*3/uL (ref 0.1–1.0)
NEUTROS ABS: 3 10*3/uL (ref 1.4–7.7)
Neutrophils Relative %: 62 % (ref 43.0–77.0)
PLATELETS: 111 10*3/uL — AB (ref 150.0–400.0)
RBC: 4.4 Mil/uL (ref 4.22–5.81)
RDW: 13.2 % (ref 11.5–15.5)
WBC: 4.9 10*3/uL (ref 4.0–10.5)

## 2017-07-12 LAB — TSH: TSH: 1.85 u[IU]/mL (ref 0.35–4.50)

## 2017-07-12 MED ORDER — ZOSTER VAC RECOMB ADJUVANTED 50 MCG/0.5ML IM SUSR: 0.5000 mL | Freq: Once | INTRAMUSCULAR | 1 refills | Status: AC

## 2017-07-12 NOTE — Assessment & Plan Note (Signed)
Following with urology

## 2017-07-12 NOTE — Assessment & Plan Note (Addendum)
GERD controlled, has GERD only on occasion Continue daily medication

## 2017-07-12 NOTE — Assessment & Plan Note (Signed)
cbc

## 2017-07-12 NOTE — Patient Instructions (Addendum)
Mr. Day , Thank you for taking time to come for your Medicare Wellness Visit. I appreciate your ongoing commitment to your health goals. Please review the following plan we discussed and let me know if I can assist you in the future.   These are the goals we discussed: Goals    None      This is a list of the screening recommended for you and due dates:  Health Maintenance  Topic Date Due  . Flu Shot  06/27/2017  . Tetanus Vaccine  06/10/2023  . Pneumonia vaccines  Completed     Test(s) ordered today. Your results will be released to [place] (or called to you) after review, usually within 72hours after test completion. If any changes need to be made, you will be notified at that same time.  All other Health Maintenance issues reviewed.   All recommended immunizations and age-appropriate screenings are up-to-date or discussed.  No immunizations administered today.  Consider getting the new shingles vaccine.    Medications reviewed and updated.  No changes recommended at this time.   Please followup in one year    Health Maintenance, Male A healthy lifestyle and preventive care is important for your health and wellness. Ask your health care provider about what schedule of regular examinations is right for you. What should I know about weight and diet? Eat a Healthy Diet  Eat plenty of vegetables, fruits, whole grains, low-fat dairy products, and lean protein.  Do not eat a lot of foods high in solid fats, added sugars, or salt.  Maintain a Healthy Weight Regular exercise can help you achieve or maintain a healthy weight. You should:  Do at least 150 minutes of exercise each week. The exercise should increase your heart rate and make you sweat (moderate-intensity exercise).  Do strength-training exercises at least twice a week.  Watch Your Levels of Cholesterol and Blood Lipids  Have your blood tested for lipids and cholesterol every 5 years starting at 79 years of  age. If you are at high risk for heart disease, you should start having your blood tested when you are 79 years old. You may need to have your cholesterol levels checked more often if: ? Your lipid or cholesterol levels are high. ? You are older than 79 years of age. ? You are at high risk for heart disease.  What should I know about cancer screening? Many types of cancers can be detected early and may often be prevented. Lung Cancer  You should be screened every year for lung cancer if: ? You are a current smoker who has smoked for at least 30 years. ? You are a former smoker who has quit within the past 15 years.  Talk to your health care provider about your screening options, when you should start screening, and how often you should be screened.  Colorectal Cancer  Routine colorectal cancer screening usually begins at 79 years of age and should be repeated every 5-10 years until you are 79 years old. You may need to be screened more often if early forms of precancerous polyps or small growths are found. Your health care provider may recommend screening at an earlier age if you have risk factors for colon cancer.  Your health care provider may recommend using home test kits to check for hidden blood in the stool.  A small camera at the end of a tube can be used to examine your colon (sigmoidoscopy or colonoscopy). This checks for the  earliest forms of colorectal cancer.  Prostate and Testicular Cancer  Depending on your age and overall health, your health care provider may do certain tests to screen for prostate and testicular cancer.  Talk to your health care provider about any symptoms or concerns you have about testicular or prostate cancer.  Skin Cancer  Check your skin from head to toe regularly.  Tell your health care provider about any new moles or changes in moles, especially if: ? There is a change in a mole's size, shape, or color. ? You have a mole that is larger than  a pencil eraser.  Always use sunscreen. Apply sunscreen liberally and repeat throughout the day.  Protect yourself by wearing long sleeves, pants, a wide-brimmed hat, and sunglasses when outside.  What should I know about heart disease, diabetes, and high blood pressure?  If you are 63-86 years of age, have your blood pressure checked every 3-5 years. If you are 17 years of age or older, have your blood pressure checked every year. You should have your blood pressure measured twice-once when you are at a hospital or clinic, and once when you are not at a hospital or clinic. Record the average of the two measurements. To check your blood pressure when you are not at a hospital or clinic, you can use: ? An automated blood pressure machine at a pharmacy. ? A home blood pressure monitor.  Talk to your health care provider about your target blood pressure.  If you are between 66-45 years old, ask your health care provider if you should take aspirin to prevent heart disease.  Have regular diabetes screenings by checking your fasting blood sugar level. ? If you are at a normal weight and have a low risk for diabetes, have this test once every three years after the age of 87. ? If you are overweight and have a high risk for diabetes, consider being tested at a younger age or more often.  A one-time screening for abdominal aortic aneurysm (AAA) by ultrasound is recommended for men aged 48-75 years who are current or former smokers. What should I know about preventing infection? Hepatitis B If you have a higher risk for hepatitis B, you should be screened for this virus. Talk with your health care provider to find out if you are at risk for hepatitis B infection. Hepatitis C Blood testing is recommended for:  Everyone born from [birth year] through 1963.  Anyone with known risk factors for hepatitis C.  Sexually Transmitted Diseases (STDs)  You should be screened each year for STDs including gonorrhea  and chlamydia if: ? You are sexually active and are younger than 79 years of age. ? You are older than 79 years of age and your health care provider tells you that you are at risk for this type of infection. ? Your sexual activity has changed since you were last screened and you are at an increased risk for chlamydia or gonorrhea. Ask your health care provider if you are at risk.  Talk with your health care provider about whether you are at high risk of being infected with HIV. Your health care provider may recommend a prescription medicine to help prevent HIV infection.  What else can I do?  Schedule regular health, dental, and eye exams.  Stay current with your vaccines (immunizations).  Do not use any tobacco products, such as cigarettes, chewing tobacco, and e-cigarettes. If you need help quitting, ask your health care provider.  Limit alcohol  intake to no more than 2 drinks per day. One drink equals 12 ounces of beer, 5 ounces of wine, or 1 ounces of hard liquor.  Do not use street drugs.  Do not share needles.  Ask your health care provider for help if you need support or information about quitting drugs.  Tell your health care provider if you often feel depressed.  Tell your health care provider if you have ever been abused or do not feel safe at home. This information is not intended to replace advice given to you by your health care provider. Make sure you discuss any questions you have with your health care provider. Document Released: 05/11/2008 Document Revised: 07/12/2016 Document Reviewed: 08/17/2015 Elsevier Interactive Patient Education  [employer].

## 2017-07-12 NOTE — Progress Notes (Signed)
Subjective:    Patient ID: Alvin Ingram, male    DOB: 02-08-1936, 81 y.o.   MRN: [id number]  HPI Here for subsequent medicare wellness exam and a physical exam  I have personally reviewed and have noted 1.The patient's medical and social history 2.Their use of alcohol, tobacco or illicit drugs 3.Their current medications and supplements 4.The patient's functional ability including ADL's, fall risks, home                 safety risk and hearing or visual impairment. 5.Diet and physical activities 6.Evidence for depression or mood disorders 7.Care team reviewed  -  Urology - Dr Diona Fanti, Eye - Dr Modena Morrow, dentist    Are there smokers in your home (other than you)? No  Risk Factors Exercise: walking some for exercise, active around house Dietary issues discussed: most of the times well balanced, eats low sugar/carb  Cardiac risk factors: advanced age, hypertension, hyperlipidemia.  Depression Screen  Have you felt down, depressed or hopeless? No  Have you felt little interest or pleasure in doing things?  No  Activities of Daily Living In your present state of health, do you have any difficulty performing the following activities?:  Driving? No Managing money?  No Feeding yourself? No Getting from bed to chair? No Climbing a flight of stairs? No Preparing food and eating?: No Bathing or showering? No Getting dressed: No Getting to/using the toilet? No Moving around from place to place: No In the past year have you fallen or had a near fall?: No   Are you sexually active?  No  Do you have more than one partner?  N/A  Hearing Difficulties: No - has had hearing evaluated recently Do you often ask people to speak up or repeat themselves? No Do you experience ringing or noises in your ears? yes Do you have difficulty understanding soft or whispered voices? No Vision:              Any change in vision:   no             Up to date with eye exam:    Up to date   Memory:  Do you feel that you have a problem with memory? No  Do you often misplace items? occasionally  Do you feel safe at home?  Yes  Cognitive Testing  Alert, Orientated? Yes  Normal Appearance? Yes  Recall of three objects?  Yes  Can perform simple calculations? Yes  Displays appropriate judgment? Yes  Can read the correct time from a watch face? Yes   Advanced Directives have been discussed with the patient? Yes - working on it   Medications and allergies reviewed with patient and updated if appropriate.  Patient Active Problem List   Diagnosis Date Noted  . HYPERTROPHY PROSTATE W/O UR OBST & OTH LUTS 05/31/2010  . THROMBOCYTOPENIA, CHRONIC 05/24/2009  . PREMATURE ATRIAL CONTRACTIONS 05/24/2009  . GERD 05/21/2008  . Nocturia 05/21/2008  . Hyperlipidemia 04/23/2007  . Essential hypertension 04/23/2007  . CARDIAC MURMUR, AORTIC 03/15/2007  . History of colonic polyps 03/15/2007    Current Outpatient Prescriptions on File Prior to Visit  Medication Sig Dispense Refill  . amitriptyline (ELAVIL) 25 MG tablet Take 25 mg by mouth at bedtime.    Marland Kitchen aspirin 81 MG tablet Take 81 mg by mouth daily.      Marland Kitchen doxazosin (CARDURA) 8 MG tablet Take 1 tablet (8 mg total) by mouth daily. 90 tablet 1  .  Flaxseed, Linseed, (FLAX SEED OIL PO) Take 1,200 mg by mouth daily.      Marland Kitchen losartan (COZAAR) 25 MG tablet TAKE 1 TABLET(25 MG) BY MOUTH DAILY 90 tablet 1  . metoprolol succinate (TOPROL-XL) 100 MG 24 hr tablet TAKE 1 TABLET BY MOUTH ONCE DAILY WITH OR IMMEDIATELY FOLLOWING A MEAL 180 tablet 0  . Multiple Vitamins-Minerals (CENTRUM SILVER PO) Take by mouth daily.      . Omega-3 Fatty Acids (FISH OIL) 1200 MG CAPS Take 1,200 mg by mouth daily.      Marland Kitchen omeprazole (PRILOSEC) 20 MG capsule TAKE ONE CAPSULE BY MOUTH DAILY 90 capsule 3  . simvastatin (ZOCOR) 20 MG tablet TAKE 1 TABLET BY MOUTH AT BEDTIME 90 tablet 2   No current  facility-administered medications on file prior to visit.     Past Medical History:  Diagnosis Date  . Cardiac murmur    Aortic  . Diverticulosis 2006  . GERD (gastroesophageal reflux disease)   . History of colonic polyps 2003,2011   Dr Earlean Shawl  . History of syncope 1966   ? stress related  . Hyperlipidemia   . Hypertension    hypertensive response @ stress test    Past Surgical History:  Procedure Laterality Date  . CATARACT EXTRACTION  2003 & 2011   Dr Charise Killian  . COLONOSCOPY  04/2005   tics, no polyps. Dr.Medoff  . COLONOSCOPY W/ POLYPECTOMY  2003, 2011   neg 2006; due 2016. Dr Earlean Shawl    Social History   Social History  . Marital status: Single    Spouse name: N/A  . Number of children: N/A  . Years of education: N/A   Social History Main Topics  . Smoking status: Former Smoker    Quit date: 11/28/1967  . Smokeless tobacco: Not on file     Comment: smoked 1955-1969 , up to 1 ppd  . Alcohol use Yes     Comment: RARELY  . Drug use: No  . Sexual activity: Not on file   Other Topics Concern  . Not on file   Social History Narrative  . No narrative on file    Family History  Problem Relation Age of Onset  . Heart attack Mother 33  . Colon polyps Brother   . Pneumonia Brother   . Hypertension Brother   . Heart attack Brother 25  . Lung cancer Brother   . Other Father        Benign Brain Tumor  . Tuberculosis Paternal Uncle   . Diabetes Neg Hx   . Stroke Neg Hx   . Other Maternal Grandmother     Review of Systems  Constitutional: Negative for chills and fever.  HENT: Positive for tinnitus. Negative for hearing loss.   Eyes: Negative for visual disturbance.  Respiratory: Negative for cough, shortness of breath and wheezing.   Cardiovascular: Negative for chest pain, palpitations and leg swelling.  Gastrointestinal: Negative for abdominal pain, blood in stool, constipation, diarrhea and nausea.  Genitourinary: Negative for dysuria and hematuria.    Musculoskeletal: Positive for arthralgias (shoulders, knees).  Neurological: Negative for light-headedness and headaches.  Psychiatric/Behavioral: Negative for dysphoric mood. The patient is not nervous/anxious.        Objective:   Vitals:   07/12/17 1055  BP: (!) 152/80  Pulse: 82  Resp: 16  Temp: 98.3 F (36.8 C)  SpO2: 96%   Filed Weights   07/12/17 1055  Weight: 187 lb (84.8 kg)   Body mass index  is 24.34 kg/m.  Wt Readings from Last 3 Encounters:  07/12/17 187 lb (84.8 kg)  07/12/16 195 lb (88.5 kg)  06/14/16 193 lb (87.5 kg)     Physical Exam Constitutional: He appears well-developed and well-nourished. No distress.  HENT:  Head: Normocephalic and atraumatic.  Right Ear: External ear normal.  Left Ear: External ear normal.  Mouth/Throat: Oropharynx is clear and moist.  Normal ear canals and TM b/l  Eyes: Conjunctivae and EOM are normal.  Neck: Neck supple. No tracheal deviation present. No thyromegaly present.  No carotid bruit  Cardiovascular: Normal rate, regular rhythm, normal heart sounds and intact distal pulses.   No murmur heard. Pulmonary/Chest: Effort normal and breath sounds normal. No respiratory distress. He has no wheezes. He has no rales.  Abdominal: Soft. He exhibits no distension. There is no tenderness.  Genitourinary: deferred  Musculoskeletal: He exhibits no edema.  Lymphadenopathy:   He has no cervical adenopathy.  Skin: Skin is warm and dry. He is not diaphoretic.  Psychiatric: He has a normal mood and affect. His behavior is normal.         Assessment & Plan:   Wellness Exam: Immunizations  Up to date, discussed shingrix Colonoscopy - no longer needed due to age Eye exam   Up to date  Hearing loss none - has been evaluated Memory concerns/difficulties  None  Independent of ADLs   fully Stressed the importance of regular exercise   Patient received copy of preventative screening tests/immunizations recommended for the  next 5-10 years.  Physical exam: Screening blood work  ordered Immunizations  Up to date, discussed shingrix Colonoscopy  - no longer needed based on age Eye exams  Up to date  EKG  Last done 2014 Exercise - walking some - thinking about doing silver sneakers Weight  Normal BMI Skin  No concerns Substance abuse  none  See Problem List for Assessment and Plan of chronic medical problems.  FU annually

## 2017-07-12 NOTE — Assessment & Plan Note (Addendum)
BP Readings from Last 3 Encounters:  07/12/17 (!) 152/80  07/12/16 (!) 164/80  06/14/16 (!) 184/92   Better controlled at home Goal < 150/90 Continue current medications Monitor BP at home

## 2017-07-12 NOTE — Assessment & Plan Note (Signed)
Check lipid panel  Continue daily statin Regular exercise and healthy diet encouraged  

## 2017-08-07 ENCOUNTER — Ambulatory Visit (INDEPENDENT_AMBULATORY_CARE_PROVIDER_SITE_OTHER): Payer: Medicare HMO | Admitting: General Practice

## 2017-08-07 DIAGNOSIS — Z23 Encounter for immunization: Secondary | ICD-10-CM | POA: Diagnosis not present

## 2017-08-07 NOTE — Progress Notes (Signed)
Injection given.   Deedee Lybarger J Lerae Langham, MD  

## 2017-08-15 DIAGNOSIS — R351 Nocturia: Secondary | ICD-10-CM | POA: Diagnosis not present

## 2017-08-15 DIAGNOSIS — N401 Enlarged prostate with lower urinary tract symptoms: Secondary | ICD-10-CM | POA: Diagnosis not present

## 2017-08-15 DIAGNOSIS — N3281 Overactive bladder: Secondary | ICD-10-CM | POA: Diagnosis not present

## 2017-08-16 ENCOUNTER — Other Ambulatory Visit: Payer: Self-pay | Admitting: Internal Medicine

## 2017-11-13 DIAGNOSIS — H524 Presbyopia: Secondary | ICD-10-CM | POA: Diagnosis not present

## 2017-11-13 DIAGNOSIS — I10 Essential (primary) hypertension: Secondary | ICD-10-CM | POA: Diagnosis not present

## 2017-12-02 ENCOUNTER — Other Ambulatory Visit: Payer: Self-pay | Admitting: Internal Medicine

## 2017-12-14 ENCOUNTER — Other Ambulatory Visit: Payer: Self-pay | Admitting: Internal Medicine

## 2017-12-18 ENCOUNTER — Other Ambulatory Visit: Payer: Self-pay | Admitting: Internal Medicine

## 2018-02-22 ENCOUNTER — Telehealth: Payer: Self-pay | Admitting: Internal Medicine

## 2018-02-22 NOTE — Telephone Encounter (Signed)
Copied from [place] [id number]. Topic: General - Other >> Feb 22, 2018 12:48 PM Marin Olp L wrote: Reason for CRM: Patient would like a call back from Dr. Billey Gosling about a recall of losartan.

## 2018-02-22 NOTE — Telephone Encounter (Signed)
Spoke with pt, pt states that his RX lot number was not recalled, advised pt that he can continue taking that medication.

## 2018-05-31 ENCOUNTER — Other Ambulatory Visit: Payer: Self-pay | Admitting: Internal Medicine

## 2018-06-14 ENCOUNTER — Other Ambulatory Visit: Payer: Self-pay | Admitting: Internal Medicine

## 2018-06-16 ENCOUNTER — Other Ambulatory Visit: Payer: Self-pay | Admitting: Internal Medicine

## 2018-07-12 NOTE — Progress Notes (Addendum)
Subjective:   Alvin Ingram is a 82 y.o. male who presents for Medicare Annual/Subsequent preventive examination.  Review of Systems:  No ROS.  Medicare Wellness Visit. Additional risk factors are reflected in the social history.  Cardiac Risk Factors include: advanced age (>63men, >105 women);dyslipidemia;hypertension;male gender Sleep patterns: feels rested on waking, gets up 1-2 times nightly to void and sleeps 7-8 hours nightly.    Home Safety/Smoke Alarms: Feels safe in home. Smoke alarms in place.  Living environment; residence and Firearm Safety: 1-story house/ trailer, no firearms. Seat Belt Safety/Bike Helmet: Wears seat belt.     Objective:    Vitals: BP (!) 164/74   Pulse 70   Resp 18   Ht 6\' 2"  (1.88 m)   Wt 182 lb (82.6 kg)   SpO2 97%   BMI 23.37 kg/m   Body mass index is 23.37 kg/m.  Advanced Directives 07/15/2018  Does Patient Have a Medical Advance Directive? No  Would patient like information on creating a medical advance directive? Yes (ED - Information included in AVS)    Tobacco Social History   Tobacco Use  Smoking Status Former Smoker  . Last attempt to quit: 11/28/1967  . Years since quitting: 50.6  Smokeless Tobacco Never Used  Tobacco Comment   smoked 1955-1969 , up to 1 ppd     Counseling given: Not Answered Comment: smoked 1955-1969 , up to 1 ppd  Past Medical History:  Diagnosis Date  . Cardiac murmur    Aortic  . Diverticulosis 2006  . GERD (gastroesophageal reflux disease)   . History of colonic polyps 2003,2011   Dr Earlean Shawl  . History of syncope 1966   ? stress related  . Hyperlipidemia   . Hypertension    hypertensive response @ stress test   Past Surgical History:  Procedure Laterality Date  . CATARACT EXTRACTION  2003 & 2011   Dr Charise Killian  . COLONOSCOPY  04/2005   tics, no polyps. Dr.Medoff  . COLONOSCOPY W/ POLYPECTOMY  2003, 2011   neg 2006; due 2016. Dr Earlean Shawl   Family History  Problem Relation Age of Onset  . Heart  attack Mother 30  . Colon polyps Brother   . Pneumonia Brother   . Heart attack Brother 24  . Lung cancer Brother   . Other Father        Benign Brain Tumor  . Hypertension Brother   . Tuberculosis Paternal Uncle   . Other Maternal Grandmother   . Diabetes Neg Hx   . Stroke Neg Hx    Social History   Socioeconomic History  . Marital status: Single    Spouse name: Not on file  . Number of children: Not on file  . Years of education: Not on file  . Highest education level: Not on file  Occupational History  . Not on file  Social Needs  . Financial resource strain: Not hard at all  . Food insecurity:    Worry: Never true    Inability: Never true  . Transportation needs:    Medical: No    Non-medical: No  Tobacco Use  . Smoking status: Former Smoker    Last attempt to quit: 11/28/1967    Years since quitting: 50.6  . Smokeless tobacco: Never Used  . Tobacco comment: smoked 1955-1969 , up to 1 ppd  Substance and Sexual Activity  . Alcohol use: Yes    Comment: RARELY  . Drug use: No  . Sexual activity: Not Currently  Lifestyle  . Physical activity:    Days per week: 3 days    Minutes per session: 40 min  . Stress: Not at all  Relationships  . Social connections:    Talks on phone: More than three times a week    Gets together: More than three times a week    Attends religious service: More than 4 times per year    Active member of club or organization: Yes    Attends meetings of clubs or organizations: More than 4 times per year    Relationship status: Not on file  Other Topics Concern  . Not on file  Social History Narrative  . Not on file    Outpatient Encounter Medications as of 07/15/2018  Medication Sig  . amitriptyline (ELAVIL) 25 MG tablet Take 25 mg by mouth at bedtime.  Marland Kitchen aspirin 81 MG tablet Take 81 mg by mouth daily.    Marland Kitchen doxazosin (CARDURA) 8 MG tablet Take 1 tablet (8 mg total) by mouth daily.  Marland Kitchen losartan (COZAAR) 50 MG tablet Take 1 tablet (50 mg  total) by mouth daily.  . metoprolol succinate (TOPROL-XL) 100 MG 24 hr tablet TAKE 1 TABLET BY MOUTH ONCE DAILY WITH  OR  IMMEDIATELY  FOLLOWING  A  MEAL  . Multiple Vitamins-Minerals (CENTRUM SILVER PO) Take by mouth daily.    Marland Kitchen omeprazole (PRILOSEC) 20 MG capsule TAKE 1 CAPSULE BY MOUTH ONCE DAILY  . simvastatin (ZOCOR) 20 MG tablet TAKE ONE TABLET BY MOUTH AT BEDTIME  . [DISCONTINUED] doxazosin (CARDURA) 8 MG tablet Take 1 tablet (8 mg total) by mouth daily. -- Office visit needed for further refills  . [DISCONTINUED] Flaxseed, Linseed, (FLAX SEED OIL PO) Take 1,200 mg by mouth daily.    . [DISCONTINUED] losartan (COZAAR) 25 MG tablet TAKE ONE TABLET BY MOUTH ONCE DAILY  . [DISCONTINUED] Omega-3 Fatty Acids (FISH OIL) 1200 MG CAPS Take 1,200 mg by mouth daily.     No facility-administered encounter medications on file as of 07/15/2018.     Activities of Daily Living In your present state of health, do you have any difficulty performing the following activities: 07/15/2018  Hearing? N  Vision? N  Difficulty concentrating or making decisions? N  Walking or climbing stairs? N  Dressing or bathing? N  Doing errands, shopping? N  Preparing Food and eating ? N  Using the Toilet? N  In the past six months, have you accidently leaked urine? N  Do you have problems with loss of bowel control? N  Managing your Medications? N  Managing your Finances? N  Housekeeping or managing your Housekeeping? N  Some recent data might be hidden    Patient Care Team: Binnie Rail, MD as PCP - General (Internal Medicine)   Assessment:   This is a routine wellness examination for [place]. Physical assessment deferred to PCP.   Exercise Activities and Dietary recommendations Current Exercise Habits: Home exercise routine, Type of exercise: walking, Time (Minutes): 45, Frequency (Times/Week): 4, Weekly Exercise (Minutes/Week): 180, Intensity: Mild, Exercise limited by: orthopedic condition(s)  Diet  (meal preparation, eat out, water intake, caffeinated beverages, dairy products, fruits and vegetables): in general, a "healthy" diet  , well balanced   Reviewed heart healthy diet. Encouraged patient to increase daily water and healthy fluid intake.  Goals    . Patient Stated     Continue to treat people nicely, help people, stay active in the community watch, fight hunger.  Fall Risk Fall Risk  07/15/2018 07/12/2017 06/14/2016  Falls in the past year? No No No    Depression Screen PHQ 2/9 Scores 07/15/2018 07/12/2017 06/14/2016  PHQ - 2 Score 4 0 0  PHQ- 9 Score 6 - -    Cognitive Function MMSE - Mini Mental State Exam 07/15/2018  Orientation to time 5  Orientation to Place 5  Registration 3  Attention/ Calculation 5  Recall 2  Language- name 2 objects 2  Language- repeat 1  Language- follow 3 step command 3  Language- read & follow direction 1  Write a sentence 1  Copy design 1  Total score 29        Immunization History  Administered Date(s) Administered  . Influenza Split 09/12/2011, 08/08/2012  . Influenza Whole 09/11/2007, 08/19/2008, 08/24/2009, 08/02/2010  . Influenza, High Dose Seasonal PF 08/01/2016, 08/07/2017  . Influenza,inj,Quad PF,6+ Mos 08/20/2013, 08/06/2014, 08/13/2015  . Pneumococcal Conjugate-13 10/20/2015  . Pneumococcal Polysaccharide-23 06/05/2011  . Td 11/13/2002  . Tetanus 06/09/2013  . Zoster 07/14/2011   Screening Tests Health Maintenance  Topic Date Due  . INFLUENZA VACCINE  06/27/2018  . TETANUS/TDAP  06/10/2023  . PNA vac Low Risk Adult  Completed      Plan:    Continue doing brain stimulating activities (puzzles, reading, adult coloring books, staying active) to keep memory sharp.   Continue to eat heart healthy diet (full of fruits, vegetables, whole grains, lean protein, water--limit salt, fat, and sugar intake) and increase physical activity as tolerated.  I have personally reviewed and noted the following in the  patient's chart:   . Medical and social history . Use of alcohol, tobacco or illicit drugs  . Current medications and supplements . Functional ability and status . Nutritional status . Physical activity . Advanced directives . List of other physicians . Vitals . Screenings to include cognitive, depression, and falls . Referrals and appointments  In addition, I have reviewed and discussed with patient certain preventive protocols, quality metrics, and best practice recommendations. A written personalized care plan for preventive services as well as general preventive health recommendations were provided to patient.     Michiel Cowboy, RN  07/15/2018   Medical screening examination/treatment/procedure(s) were performed by non-physician practitioner and as supervising physician I was immediately available for consultation/collaboration. I agree with above. Binnie Rail, MD

## 2018-07-14 NOTE — Progress Notes (Signed)
Subjective:    Patient ID: Alvin Ingram, male    DOB: [birth year], 82 y.o.   MRN: [id number]  HPI  He is here for a physical exam.   He denies any changes in his health.    His BP at home fluctuates - the highest is 160/?.  He thinks he is typically 140-150/?.    He has no concerns.   Medications and allergies reviewed with patient and updated if appropriate.  Patient Active Problem List   Diagnosis Date Noted  . BPH (benign prostatic hyperplasia) 05/31/2010  . THROMBOCYTOPENIA, CHRONIC 05/24/2009  . PREMATURE ATRIAL CONTRACTIONS 05/24/2009  . GERD 05/21/2008  . Nocturia 05/21/2008  . Hyperlipidemia 04/23/2007  . Essential hypertension 04/23/2007  . History of colonic polyps 03/15/2007    Current Outpatient Medications on File Prior to Visit  Medication Sig Dispense Refill  . amitriptyline (ELAVIL) 25 MG tablet Take 25 mg by mouth at bedtime.    Marland Kitchen aspirin 81 MG tablet Take 81 mg by mouth daily.      . metoprolol succinate (TOPROL-XL) 100 MG 24 hr tablet TAKE 1 TABLET BY MOUTH ONCE DAILY WITH  OR  IMMEDIATELY  FOLLOWING  A  MEAL 30 tablet 0  . Multiple Vitamins-Minerals (CENTRUM SILVER PO) Take by mouth daily.      Marland Kitchen omeprazole (PRILOSEC) 20 MG capsule TAKE 1 CAPSULE BY MOUTH ONCE DAILY 90 capsule 0  . simvastatin (ZOCOR) 20 MG tablet TAKE ONE TABLET BY MOUTH AT BEDTIME 180 tablet 1   No current facility-administered medications on file prior to visit.     Past Medical History:  Diagnosis Date  . Cardiac murmur    Aortic  . Diverticulosis 2006  . GERD (gastroesophageal reflux disease)   . History of colonic polyps 2003,2011   Dr Earlean Shawl  . History of syncope 1966   ? stress related  . Hyperlipidemia   . Hypertension    hypertensive response @ stress test    Past Surgical History:  Procedure Laterality Date  . CATARACT EXTRACTION  2003 & 2011   Dr Charise Killian  . COLONOSCOPY  04/2005   tics, no polyps. Dr.Medoff  . COLONOSCOPY W/ POLYPECTOMY  2003, 2011   neg 2006;  due 2016. Dr Earlean Shawl    Social History   Socioeconomic History  . Marital status: Single    Spouse name: Not on file  . Number of children: Not on file  . Years of education: Not on file  . Highest education level: Not on file  Occupational History  . Not on file  Social Needs  . Financial resource strain: Not hard at all  . Food insecurity:    Worry: Never true    Inability: Never true  . Transportation needs:    Medical: No    Non-medical: No  Tobacco Use  . Smoking status: Former Smoker    Last attempt to quit: 11/28/1967    Years since quitting: 50.6  . Smokeless tobacco: Never Used  . Tobacco comment: smoked 1955-1969 , up to 1 ppd  Substance and Sexual Activity  . Alcohol use: Yes    Comment: RARELY  . Drug use: No  . Sexual activity: Not Currently  Lifestyle  . Physical activity:    Days per week: 3 days    Minutes per session: 40 min  . Stress: Not at all  Relationships  . Social connections:    Talks on phone: More than three times a week    Gets  together: More than three times a week    Attends religious service: More than 4 times per year    Active member of club or organization: Yes    Attends meetings of clubs or organizations: More than 4 times per year    Relationship status: Not on file  Other Topics Concern  . Not on file  Social History Narrative  . Not on file    Family History  Problem Relation Age of Onset  . Heart attack Mother 2  . Colon polyps Brother   . Pneumonia Brother   . Heart attack Brother 5  . Lung cancer Brother   . Other Father        Benign Brain Tumor  . Hypertension Brother   . Tuberculosis Paternal Uncle   . Other Maternal Grandmother   . Diabetes Neg Hx   . Stroke Neg Hx     Review of Systems  Constitutional: Negative for chills, fatigue and fever.  HENT: Positive for tinnitus. Negative for hearing loss.   Eyes: Negative for visual disturbance.  Respiratory: Negative for cough, shortness of breath and  wheezing.   Cardiovascular: Positive for palpitations (1-2 times this year). Negative for chest pain and leg swelling.  Gastrointestinal: Negative for abdominal pain, blood in stool, constipation, diarrhea and nausea.       Some gerd  Genitourinary: Negative for dysuria and hematuria.  Musculoskeletal: Positive for arthralgias (left shoulder) and back pain (lower back).  Skin: Negative for color change and rash.  Neurological: Positive for headaches (rare). Negative for light-headedness.  Psychiatric/Behavioral: Negative for dysphoric mood. The patient is not nervous/anxious.        Objective:   Vitals:   07/15/18 1351  BP: (!) 164/74  Pulse: 70  Resp: 16  Temp: 98 F (36.7 C)  SpO2: 97%   Filed Weights   07/15/18 1351  Weight: 182 lb (82.6 kg)   Body mass index is 23.69 kg/m.  Wt Readings from Last 3 Encounters:  07/15/18 182 lb (82.6 kg)  07/15/18 182 lb (82.6 kg)  07/12/17 187 lb (84.8 kg)     Physical Exam Constitutional: He appears well-developed and well-nourished. No distress.  HENT:  Head: Normocephalic and atraumatic.  Right Ear: External ear normal.  Left Ear: External ear normal.  Mouth/Throat: Oropharynx is clear and moist.  Normal ear canals and TM b/l  Eyes: Conjunctivae and EOM are normal.  Neck: Neck supple. No tracheal deviation present. No thyromegaly present.  No carotid bruit  Cardiovascular: Normal rate, regular rhythm, normal heart sounds and intact distal pulses.   No murmur heard. Pulmonary/Chest: Effort normal and breath sounds normal. No respiratory distress. He has no wheezes. He has no rales.  Abdominal: Soft. He exhibits no distension. There is no tenderness.  Genitourinary: deferred  Musculoskeletal: He exhibits no edema.  Lymphadenopathy:   He has no cervical adenopathy.  Skin: Skin is warm and dry. He is not diaphoretic.  Psychiatric: He has a normal mood and affect. His behavior is normal.         Assessment & Plan:     Physical exam: Screening blood work   ordered Immunizations  Had shingrix, flu this fall, others up to date Colonoscopy - no longer needed due to age Eye exams    Up to date  EKG    Last done 2014 Exercise  Walks 3-4 times a week  Weight   Normal BMI Skin     No concerns Substance abuse  none  See Problem List for Assessment and Plan of chronic medical problems.    FU in one year

## 2018-07-14 NOTE — Patient Instructions (Addendum)
Test(s) ordered today. Your results will be released to [place] (or called to you) after review, usually within 72hours after test completion. If any changes need to be made, you will be notified at that same time.  All other Health Maintenance issues reviewed.   All recommended immunizations and age-appropriate screenings are up-to-date or discussed.  No immunizations administered today.   Medications reviewed and updated.  Changes include increasing losartan to 50 mg daily.   Your prescription(s) have been submitted to your pharmacy. Please take as directed and contact our office if you believe you are having problem(s) with the medication(s).   Please followup in one year    Health Maintenance, Male A healthy lifestyle and preventive care is important for your health and wellness. Ask your health care provider about what schedule of regular examinations is right for you. What should I know about weight and diet? Eat a Healthy Diet  Eat plenty of vegetables, fruits, whole grains, low-fat dairy products, and lean protein.  Do not eat a lot of foods high in solid fats, added sugars, or salt.  Maintain a Healthy Weight Regular exercise can help you achieve or maintain a healthy weight. You should:  Do at least 150 minutes of exercise each week. The exercise should increase your heart rate and make you sweat (moderate-intensity exercise).  Do strength-training exercises at least twice a week.  Watch Your Levels of Cholesterol and Blood Lipids  Have your blood tested for lipids and cholesterol every 5 years starting at 80 years of age. If you are at high risk for heart disease, you should start having your blood tested when you are 80 years old. You may need to have your cholesterol levels checked more often if: ? Your lipid or cholesterol levels are high. ? You are older than 80 years of age. ? You are at high risk for heart disease.  What should I know about cancer  screening? Many types of cancers can be detected early and may often be prevented. Lung Cancer  You should be screened every year for lung cancer if: ? You are a current smoker who has smoked for at least 30 years. ? You are a former smoker who has quit within the past 15 years.  Talk to your health care provider about your screening options, when you should start screening, and how often you should be screened.  Colorectal Cancer  Routine colorectal cancer screening usually begins at 80 years of age and should be repeated every 5-10 years until you are 80 years old. You may need to be screened more often if early forms of precancerous polyps or small growths are found. Your health care provider may recommend screening at an earlier age if you have risk factors for colon cancer.  Your health care provider may recommend using home test kits to check for hidden blood in the stool.  A small camera at the end of a tube can be used to examine your colon (sigmoidoscopy or colonoscopy). This checks for the earliest forms of colorectal cancer.  Prostate and Testicular Cancer  Depending on your age and overall health, your health care provider may do certain tests to screen for prostate and testicular cancer.  Talk to your health care provider about any symptoms or concerns you have about testicular or prostate cancer.  Skin Cancer  Check your skin from head to toe regularly.  Tell your health care provider about any new moles or changes in moles, especially if: ?  There is a change in a mole's size, shape, or color. ? You have a mole that is larger than a pencil eraser.  Always use sunscreen. Apply sunscreen liberally and repeat throughout the day.  Protect yourself by wearing long sleeves, pants, a wide-brimmed hat, and sunglasses when outside.  What should I know about heart disease, diabetes, and high blood pressure?  If you are 60-10 years of age, have your blood pressure checked  every 3-5 years. If you are 87 years of age or older, have your blood pressure checked every year. You should have your blood pressure measured twice-once when you are at a hospital or clinic, and once when you are not at a hospital or clinic. Record the average of the two measurements. To check your blood pressure when you are not at a hospital or clinic, you can use: ? An automated blood pressure machine at a pharmacy. ? A home blood pressure monitor.  Talk to your health care provider about your target blood pressure.  If you are between 67-62 years old, ask your health care provider if you should take aspirin to prevent heart disease.  Have regular diabetes screenings by checking your fasting blood sugar level. ? If you are at a normal weight and have a low risk for diabetes, have this test once every three years after the age of 33. ? If you are overweight and have a high risk for diabetes, consider being tested at a younger age or more often.  A one-time screening for abdominal aortic aneurysm (AAA) by ultrasound is recommended for men aged 28-75 years who are current or former smokers. What should I know about preventing infection? Hepatitis B If you have a higher risk for hepatitis B, you should be screened for this virus. Talk with your health care provider to find out if you are at risk for hepatitis B infection. Hepatitis C Blood testing is recommended for:  Everyone born from [birth year] through 1963.  Anyone with known risk factors for hepatitis C.  Sexually Transmitted Diseases (STDs)  You should be screened each year for STDs including gonorrhea and chlamydia if: ? You are sexually active and are younger than 80 years of age. ? You are older than 80 years of age and your health care provider tells you that you are at risk for this type of infection. ? Your sexual activity has changed since you were last screened and you are at an increased risk for chlamydia or gonorrhea. Ask your  health care provider if you are at risk.  Talk with your health care provider about whether you are at high risk of being infected with HIV. Your health care provider may recommend a prescription medicine to help prevent HIV infection.  What else can I do?  Schedule regular health, dental, and eye exams.  Stay current with your vaccines (immunizations).  Do not use any tobacco products, such as cigarettes, chewing tobacco, and e-cigarettes. If you need help quitting, ask your health care provider.  Limit alcohol intake to no more than 2 drinks per day. One drink equals 12 ounces of beer, 5 ounces of wine, or 1 ounces of hard liquor.  Do not use street drugs.  Do not share needles.  Ask your health care provider for help if you need support or information about quitting drugs.  Tell your health care provider if you often feel depressed.  Tell your health care provider if you have ever been abused or do not feel  safe at home. This information is not intended to replace advice given to you by your health care provider. Make sure you discuss any questions you have with your health care provider. Document Released: 05/11/2008 Document Revised: 07/12/2016 Document Reviewed: 08/17/2015 Elsevier Interactive Patient Education  [employer].

## 2018-07-15 ENCOUNTER — Other Ambulatory Visit (INDEPENDENT_AMBULATORY_CARE_PROVIDER_SITE_OTHER): Payer: Medicare HMO

## 2018-07-15 ENCOUNTER — Encounter: Payer: Self-pay | Admitting: Internal Medicine

## 2018-07-15 ENCOUNTER — Ambulatory Visit (INDEPENDENT_AMBULATORY_CARE_PROVIDER_SITE_OTHER): Payer: Medicare HMO | Admitting: Internal Medicine

## 2018-07-15 ENCOUNTER — Ambulatory Visit (INDEPENDENT_AMBULATORY_CARE_PROVIDER_SITE_OTHER): Payer: Medicare HMO | Admitting: *Deleted

## 2018-07-15 ENCOUNTER — Encounter: Payer: Medicare HMO | Admitting: Internal Medicine

## 2018-07-15 VITALS — BP 164/74 | HR 70 | Temp 98.0°F | Resp 16 | Ht 73.5 in | Wt 182.0 lb

## 2018-07-15 VITALS — BP 164/74 | HR 70 | Resp 18 | Ht 74.0 in | Wt 182.0 lb

## 2018-07-15 DIAGNOSIS — I10 Essential (primary) hypertension: Secondary | ICD-10-CM

## 2018-07-15 DIAGNOSIS — Z0001 Encounter for general adult medical examination with abnormal findings: Secondary | ICD-10-CM

## 2018-07-15 DIAGNOSIS — N400 Enlarged prostate without lower urinary tract symptoms: Secondary | ICD-10-CM

## 2018-07-15 DIAGNOSIS — K219 Gastro-esophageal reflux disease without esophagitis: Secondary | ICD-10-CM

## 2018-07-15 DIAGNOSIS — E7800 Pure hypercholesterolemia, unspecified: Secondary | ICD-10-CM | POA: Diagnosis not present

## 2018-07-15 DIAGNOSIS — Z0000 Encounter for general adult medical examination without abnormal findings: Secondary | ICD-10-CM

## 2018-07-15 LAB — LIPID PANEL
CHOLESTEROL: 103 mg/dL (ref 0–200)
HDL: 38.3 mg/dL — AB (ref >39.00)
LDL Cholesterol: 47 mg/dL (ref 0–99)
NONHDL: 64.36
Total CHOL/HDL Ratio: 3
Triglycerides: 89 mg/dL (ref 0.0–149.0)
VLDL: 17.8 mg/dL (ref 0.0–40.0)

## 2018-07-15 LAB — CBC WITH DIFFERENTIAL/PLATELET
Basophils Absolute: 0 10*3/uL (ref 0.0–0.1)
Basophils Relative: 0.2 % (ref 0.0–3.0)
EOS PCT: 0.9 % (ref 0.0–5.0)
Eosinophils Absolute: 0.1 10*3/uL (ref 0.0–0.7)
HCT: 39.8 % (ref 39.0–52.0)
Hemoglobin: 13.6 g/dL (ref 13.0–17.0)
LYMPHS PCT: 28.4 % (ref 12.0–46.0)
Lymphs Abs: 1.7 10*3/uL (ref 0.7–4.0)
MCHC: 34.3 g/dL (ref 30.0–36.0)
MCV: 90.7 fl (ref 78.0–100.0)
MONOS PCT: 8.9 % (ref 3.0–12.0)
Monocytes Absolute: 0.5 10*3/uL (ref 0.1–1.0)
Neutro Abs: 3.7 10*3/uL (ref 1.4–7.7)
Neutrophils Relative %: 61.6 % (ref 43.0–77.0)
Platelets: 120 10*3/uL — ABNORMAL LOW (ref 150.0–400.0)
RBC: 4.39 Mil/uL (ref 4.22–5.81)
RDW: 12.9 % (ref 11.5–15.5)
WBC: 6 10*3/uL (ref 4.0–10.5)

## 2018-07-15 LAB — COMPREHENSIVE METABOLIC PANEL
ALBUMIN: 4.3 g/dL (ref 3.5–5.2)
ALK PHOS: 107 U/L (ref 39–117)
ALT: 21 U/L (ref 0–53)
AST: 21 U/L (ref 0–37)
BUN: 21 mg/dL (ref 6–23)
CALCIUM: 9.9 mg/dL (ref 8.4–10.5)
CO2: 30 mEq/L (ref 19–32)
Chloride: 105 mEq/L (ref 96–112)
Creatinine, Ser: 1.26 mg/dL (ref 0.40–1.50)
GFR: 70.77 mL/min (ref >60.00)
Glucose, Bld: 100 mg/dL — ABNORMAL HIGH (ref 70–99)
POTASSIUM: 4.6 meq/L (ref 3.5–5.1)
Sodium: 140 mEq/L (ref 135–145)
TOTAL PROTEIN: 7 g/dL (ref 6.0–8.3)
Total Bilirubin: 0.6 mg/dL (ref 0.2–1.2)

## 2018-07-15 LAB — TSH: TSH: 1.54 u[IU]/mL (ref 0.35–4.50)

## 2018-07-15 MED ORDER — LOSARTAN POTASSIUM 50 MG PO TABS: 50.0000 mg | ORAL_TABLET | Freq: Every day | ORAL | 3 refills | Status: DC

## 2018-07-15 MED ORDER — DOXAZOSIN MESYLATE 8 MG PO TABS: 8.0000 mg | ORAL_TABLET | Freq: Every day | ORAL | 3 refills | Status: DC

## 2018-07-15 NOTE — Patient Instructions (Addendum)
Continue doing brain stimulating activities (puzzles, reading, adult coloring books, staying active) to keep memory sharp.   Continue to eat heart healthy diet (full of fruits, vegetables, whole grains, lean protein, water--limit salt, fat, and sugar intake) and increase physical activity as tolerated.   Alvin Ingram , Thank you for taking time to come for your Medicare Wellness Visit. I appreciate your ongoing commitment to your health goals. Please review the following plan we discussed and let me know if I can assist you in the future.   These are the goals we discussed: Goals    . Patient Stated     Continue to treat people nicely, help people, stay active in the community watch, fight hunger.        This is a list of the screening recommended for you and due dates:  Health Maintenance  Topic Date Due  . Flu Shot  06/27/2018  . Tetanus Vaccine  06/10/2023  . Pneumonia vaccines  Completed     Health Maintenance, Male A healthy lifestyle and preventive care is important for your health and wellness. Ask your health care provider about what schedule of regular examinations is right for you. What should I know about weight and diet? Eat a Healthy Diet  Eat plenty of vegetables, fruits, whole grains, low-fat dairy products, and lean protein.  Do not eat a lot of foods high in solid fats, added sugars, or salt.  Maintain a Healthy Weight Regular exercise can help you achieve or maintain a healthy weight. You should:  Do at least 150 minutes of exercise each week. The exercise should increase your heart rate and make you sweat (moderate-intensity exercise).  Do strength-training exercises at least twice a week.  Watch Your Levels of Cholesterol and Blood Lipids  Have your blood tested for lipids and cholesterol every 5 years starting at 80 years of age. If you are at high risk for heart disease, you should start having your blood tested when you are 80 years old. You may need to  have your cholesterol levels checked more often if: ? Your lipid or cholesterol levels are high. ? You are older than 80 years of age. ? You are at high risk for heart disease.  What should I know about cancer screening? Many types of cancers can be detected early and may often be prevented. Lung Cancer  You should be screened every year for lung cancer if: ? You are a current smoker who has smoked for at least 30 years. ? You are a former smoker who has quit within the past 15 years.  Talk to your health care provider about your screening options, when you should start screening, and how often you should be screened.  Colorectal Cancer  Routine colorectal cancer screening usually begins at 80 years of age and should be repeated every 5-10 years until you are 80 years old. You may need to be screened more often if early forms of precancerous polyps or small growths are found. Your health care provider may recommend screening at an earlier age if you have risk factors for colon cancer.  Your health care provider may recommend using home test kits to check for hidden blood in the stool.  A small camera at the end of a tube can be used to examine your colon (sigmoidoscopy or colonoscopy). This checks for the earliest forms of colorectal cancer.  Prostate and Testicular Cancer  Depending on your age and overall health, your health care provider may  do certain tests to screen for prostate and testicular cancer.  Talk to your health care provider about any symptoms or concerns you have about testicular or prostate cancer.  Skin Cancer  Check your skin from head to toe regularly.  Tell your health care provider about any new moles or changes in moles, especially if: ? There is a change in a mole's size, shape, or color. ? You have a mole that is larger than a pencil eraser.  Always use sunscreen. Apply sunscreen liberally and repeat throughout the day.  Protect yourself by wearing  long sleeves, pants, a wide-brimmed hat, and sunglasses when outside.  What should I know about heart disease, diabetes, and high blood pressure?  If you are 10-37 years of age, have your blood pressure checked every 3-5 years. If you are 28 years of age or older, have your blood pressure checked every year. You should have your blood pressure measured twice-once when you are at a hospital or clinic, and once when you are not at a hospital or clinic. Record the average of the two measurements. To check your blood pressure when you are not at a hospital or clinic, you can use: ? An automated blood pressure machine at a pharmacy. ? A home blood pressure monitor.  Talk to your health care provider about your target blood pressure.  If you are between 50-45 years old, ask your health care provider if you should take aspirin to prevent heart disease.  Have regular diabetes screenings by checking your fasting blood sugar level. ? If you are at a normal weight and have a low risk for diabetes, have this test once every three years after the age of 69. ? If you are overweight and have a high risk for diabetes, consider being tested at a younger age or more often.  A one-time screening for abdominal aortic aneurysm (AAA) by ultrasound is recommended for men aged 41-75 years who are current or former smokers. What should I know about preventing infection? Hepatitis B If you have a higher risk for hepatitis B, you should be screened for this virus. Talk with your health care provider to find out if you are at risk for hepatitis B infection. Hepatitis C Blood testing is recommended for:  Everyone born from [birth year] through 1965.  Anyone with known risk factors for hepatitis C.  Sexually Transmitted Diseases (STDs)  You should be screened each year for STDs including gonorrhea and chlamydia if: ? You are sexually active and are younger than 80 years of age. ? You are older than 80 years of age and your  health care provider tells you that you are at risk for this type of infection. ? Your sexual activity has changed since you were last screened and you are at an increased risk for chlamydia or gonorrhea. Ask your health care provider if you are at risk.  Talk with your health care provider about whether you are at high risk of being infected with HIV. Your health care provider may recommend a prescription medicine to help prevent HIV infection.  What else can I do?  Schedule regular health, dental, and eye exams.  Stay current with your vaccines (immunizations).  Do not use any tobacco products, such as cigarettes, chewing tobacco, and e-cigarettes. If you need help quitting, ask your health care provider.  Limit alcohol intake to no more than 2 drinks per day. One drink equals 12 ounces of beer, 5 ounces of Kwana Ringel, or 1 ounces  of hard liquor.  Do not use street drugs.  Do not share needles.  Ask your health care provider for help if you need support or information about quitting drugs.  Tell your health care provider if you often feel depressed.  Tell your health care provider if you have ever been abused or do not feel safe at home. This information is not intended to replace advice given to you by your health care provider. Make sure you discuss any questions you have with your health care provider. Document Released: 05/11/2008 Document Revised: 07/12/2016 Document Reviewed: 08/17/2015 Elsevier Interactive Patient Education  [employer].

## 2018-07-15 NOTE — Assessment & Plan Note (Signed)
Follows with urology

## 2018-07-15 NOTE — Assessment & Plan Note (Signed)
GERD controlled Continue daily medication  

## 2018-07-15 NOTE — Assessment & Plan Note (Signed)
Not controlled Increase losartan to 50 mg daily Continue metoprolol at current dose Also on doxazosin cmp

## 2018-07-15 NOTE — Assessment & Plan Note (Signed)
Check lipid panel  Continue daily statin Regular exercise and healthy diet encouraged  

## 2018-07-16 ENCOUNTER — Other Ambulatory Visit: Payer: Self-pay | Admitting: Internal Medicine

## 2018-08-19 ENCOUNTER — Ambulatory Visit (INDEPENDENT_AMBULATORY_CARE_PROVIDER_SITE_OTHER): Payer: Medicare HMO

## 2018-08-19 DIAGNOSIS — Z23 Encounter for immunization: Secondary | ICD-10-CM

## 2018-08-21 DIAGNOSIS — R351 Nocturia: Secondary | ICD-10-CM | POA: Diagnosis not present

## 2018-08-21 DIAGNOSIS — N5201 Erectile dysfunction due to arterial insufficiency: Secondary | ICD-10-CM | POA: Diagnosis not present

## 2018-08-21 DIAGNOSIS — N401 Enlarged prostate with lower urinary tract symptoms: Secondary | ICD-10-CM | POA: Diagnosis not present

## 2018-08-27 ENCOUNTER — Other Ambulatory Visit: Payer: Self-pay | Admitting: Internal Medicine

## 2018-10-20 ENCOUNTER — Other Ambulatory Visit: Payer: Self-pay | Admitting: Internal Medicine

## 2018-11-25 ENCOUNTER — Other Ambulatory Visit: Payer: Self-pay | Admitting: Internal Medicine

## 2019-02-22 ENCOUNTER — Other Ambulatory Visit: Payer: Self-pay | Admitting: Internal Medicine

## 2019-07-08 ENCOUNTER — Other Ambulatory Visit: Payer: Self-pay | Admitting: Internal Medicine

## 2019-07-31 ENCOUNTER — Other Ambulatory Visit: Payer: Self-pay | Admitting: Internal Medicine

## 2019-08-08 ENCOUNTER — Other Ambulatory Visit: Payer: Self-pay

## 2019-08-08 ENCOUNTER — Ambulatory Visit (INDEPENDENT_AMBULATORY_CARE_PROVIDER_SITE_OTHER): Payer: Medicare HMO

## 2019-08-08 DIAGNOSIS — Z23 Encounter for immunization: Secondary | ICD-10-CM

## 2019-08-11 ENCOUNTER — Other Ambulatory Visit: Payer: Self-pay | Admitting: Internal Medicine

## 2019-08-18 DIAGNOSIS — R739 Hyperglycemia, unspecified: Secondary | ICD-10-CM | POA: Insufficient documentation

## 2019-08-18 NOTE — Progress Notes (Signed)
Subjective:    Patient ID: Alvin Ingram, male    DOB: July 27, 1936, 83 y.o.   MRN: [id number]  HPI He is here for a physical exam.   He has cut back on how much he has been eating and has lost weight.  He is not exercising regularly.  His primary goal for losing weight was to get his blood pressure down and it is much better.  He has no other concerns.  Medications and allergies reviewed with patient and updated if appropriate.  Patient Active Problem List   Diagnosis Date Noted  . Hyperglycemia 08/18/2019  . BPH (benign prostatic hyperplasia) 05/31/2010  . THROMBOCYTOPENIA, CHRONIC 05/24/2009  . PREMATURE ATRIAL CONTRACTIONS 05/24/2009  . GERD 05/21/2008  . Nocturia 05/21/2008  . Hyperlipidemia 04/23/2007  . Essential hypertension 04/23/2007  . History of colonic polyps 03/15/2007    Current Outpatient Medications on File Prior to Visit  Medication Sig Dispense Refill  . amitriptyline (ELAVIL) 25 MG tablet Take 25 mg by mouth at bedtime.    Marland Kitchen aspirin 81 MG tablet Take 81 mg by mouth daily.      Marland Kitchen doxazosin (CARDURA) 8 MG tablet Take 1 tablet (8 mg total) by mouth daily. 90 tablet 3  . losartan (COZAAR) 50 MG tablet Take 1 tablet (50 mg total) by mouth daily. Needs to follow-up with PCP for additional refills. 30 tablet 0  . metoprolol succinate (TOPROL-XL) 100 MG 24 hr tablet TAKE 1 TABLET BY MOUTH ONCE DAILY OR  IMMEDIATELY  FOLLOWING  MEAL.  --NEED  OFFICE  VISIT  FOR  MORE  REFILLS 30 tablet 0  . Multiple Vitamins-Minerals (CENTRUM SILVER PO) Take by mouth daily.      Marland Kitchen omeprazole (PRILOSEC) 20 MG capsule Take 1 capsule by mouth once daily 90 capsule 1  . simvastatin (ZOCOR) 20 MG tablet TAKE 1 TABLET BY MOUTH AT BEDTIME 90 tablet 3   No current facility-administered medications on file prior to visit.     Past Medical History:  Diagnosis Date  . Cardiac murmur    Aortic  . Diverticulosis 2006  . GERD (gastroesophageal reflux disease)   . History of colonic polyps  2003,2011   Dr Earlean Shawl  . History of syncope 1966   ? stress related  . Hyperlipidemia   . Hypertension    hypertensive response @ stress test    Past Surgical History:  Procedure Laterality Date  . CATARACT EXTRACTION  2003 & 2011   Dr Charise Killian  . COLONOSCOPY  04/2005   tics, no polyps. Dr.Medoff  . COLONOSCOPY W/ POLYPECTOMY  2003, 2011   neg 2006; due 2016. Dr Earlean Shawl    Social History   Socioeconomic History  . Marital status: Single    Spouse name: Not on file  . Number of children: Not on file  . Years of education: Not on file  . Highest education level: Not on file  Occupational History  . Not on file  Social Needs  . Financial resource strain: Not hard at all  . Food insecurity    Worry: Never true    Inability: Never true  . Transportation needs    Medical: No    Non-medical: No  Tobacco Use  . Smoking status: Former Smoker    Quit date: 11/28/1967    Years since quitting: 51.7  . Smokeless tobacco: Never Used  . Tobacco comment: smoked 1955-1969 , up to 1 ppd  Substance and Sexual Activity  . Alcohol use: Yes  Comment: RARELY  . Drug use: No  . Sexual activity: Not Currently  Lifestyle  . Physical activity    Days per week: 3 days    Minutes per session: 40 min  . Stress: Not at all  Relationships  . Social connections    Talks on phone: More than three times a week    Gets together: More than three times a week    Attends religious service: More than 4 times per year    Active member of club or organization: Yes    Attends meetings of clubs or organizations: More than 4 times per year    Relationship status: Not on file  Other Topics Concern  . Not on file  Social History Narrative  . Not on file    Family History  Problem Relation Age of Onset  . Heart attack Mother 60  . Colon polyps Brother   . Pneumonia Brother   . Heart attack Brother 18  . Lung cancer Brother   . Other Father        Benign Brain Tumor  . Hypertension Brother   .  Tuberculosis Paternal Uncle   . Other Maternal Grandmother   . Diabetes Neg Hx   . Stroke Neg Hx     Review of Systems  Constitutional: Negative for chills and fever.  Eyes: Negative for visual disturbance.  Respiratory: Negative for cough, shortness of breath and wheezing.   Cardiovascular: Positive for palpitations (rare). Negative for chest pain and leg swelling.  Gastrointestinal: Positive for abdominal pain and constipation. Negative for blood in stool, diarrhea and nausea.       GERD controlled  Genitourinary: Negative for dysuria and hematuria.  Musculoskeletal: Positive for arthralgias.  Skin: Negative for color change and rash.  Neurological: Negative for dizziness, light-headedness and headaches.  Psychiatric/Behavioral: The patient is not nervous/anxious.        Objective:   Vitals:   08/19/19 1357  BP: 122/78  Pulse: 79  Resp: 16  Temp: 98.3 F (36.8 C)  SpO2: 98%   Filed Weights   08/19/19 1357  Weight: 167 lb (75.8 kg)   Body mass index is 21.44 kg/m.  BP Readings from Last 3 Encounters:  08/19/19 122/78  07/15/18 (!) 164/74  07/15/18 (!) 164/74    Wt Readings from Last 3 Encounters:  08/19/19 167 lb (75.8 kg)  07/15/18 182 lb (82.6 kg)  07/15/18 182 lb (82.6 kg)     Physical Exam Constitutional: He appears well-developed and well-nourished. No distress.  HENT:  Head: Normocephalic and atraumatic.  Right Ear: External ear normal.  Left Ear: External ear normal.  Mouth/Throat: Oropharynx is clear and moist.  Normal ear canals and TM b/l  Eyes: Conjunctivae and EOM are normal.  Neck: Neck supple. No tracheal deviation present. No thyromegaly present.  No carotid bruit  Cardiovascular: Normal rate, regular rhythm, normal heart sounds and intact distal pulses.   No murmur heard. Pulmonary/Chest: Effort normal and breath sounds normal. No respiratory distress. He has no wheezes. He has no rales.  Abdominal: Soft. He exhibits no distension.  There is no tenderness.  Genitourinary: deferred  Musculoskeletal: He exhibits no edema.  Lymphadenopathy:   He has no cervical adenopathy.  Skin: Skin is warm and dry. He is not diaphoretic.  Psychiatric: He has a normal mood and affect. His behavior is normal.         Assessment & Plan:   Physical exam: Screening blood work  ordered Immunizations  Up to date, discussed shingrix Colonoscopy   - no longer needed due to age Eye exams     Up to date  Exercise   Leg exercises, occasional walking Weight   Normal BMI, has lost weight Substance abuse   none  See Problem List for Assessment and Plan of chronic medical problems.    FU in one year

## 2019-08-18 NOTE — Patient Instructions (Addendum)
Tests ordered today. Your results will be released to MyChart (or called to you) after review.  If any changes need to be made, you will be notified at that same time.  All other Health Maintenance issues reviewed.   All recommended immunizations and age-appropriate screenings are up-to-date or discussed.  No immunization administered today.   Medications reviewed and updated.  Changes include :   none    Please followup in 1 year    Health Maintenance, Male Adopting a healthy lifestyle and getting preventive care are important in promoting health and wellness. Ask your health care provider about:  The right schedule for you to have regular tests and exams.  Things you can do on your own to prevent diseases and keep yourself healthy. What should I know about diet, weight, and exercise? Eat a healthy diet   Eat a diet that includes plenty of vegetables, fruits, low-fat dairy products, and lean protein.  Do not eat a lot of foods that are high in solid fats, added sugars, or sodium. Maintain a healthy weight Body mass index (BMI) is a measurement that can be used to identify possible weight problems. It estimates body fat based on height and weight. Your health care provider can help determine your BMI and help you achieve or maintain a healthy weight. Get regular exercise Get regular exercise. This is one of the most important things you can do for your health. Most adults should:  Exercise for at least 150 minutes each week. The exercise should increase your heart rate and make you sweat (moderate-intensity exercise).  Do strengthening exercises at least twice a week. This is in addition to the moderate-intensity exercise.  Spend less time sitting. Even light physical activity can be beneficial. Watch cholesterol and blood lipids Have your blood tested for lipids and cholesterol at 81 years of age, then have this test every 5 years. You may need to have your cholesterol  levels checked more often if:  Your lipid or cholesterol levels are high.  You are older than 81 years of age.  You are at high risk for heart disease. What should I know about cancer screening? Many types of cancers can be detected early and may often be prevented. Depending on your health history and family history, you may need to have cancer screening at various ages. This may include screening for:  Colorectal cancer.  Prostate cancer.  Skin cancer.  Lung cancer. What should I know about heart disease, diabetes, and high blood pressure? Blood pressure and heart disease  High blood pressure causes heart disease and increases the risk of stroke. This is more likely to develop in people who have high blood pressure readings, are of African descent, or are overweight.  Talk with your health care provider about your target blood pressure readings.  Have your blood pressure checked: ? Every 3-5 years if you are 18-39 years of age. ? Every year if you are 40 years old or older.  If you are between the ages of 65 and 75 and are a current or former smoker, ask your health care provider if you should have a one-time screening for abdominal aortic aneurysm (AAA). Diabetes Have regular diabetes screenings. This checks your fasting blood sugar level. Have the screening done:  Once every three years after age 45 if you are at a normal weight and have a low risk for diabetes.  More often and at a younger age if you are overweight or have a high   risk for diabetes. What should I know about preventing infection? Hepatitis B If you have a higher risk for hepatitis B, you should be screened for this virus. Talk with your health care provider to find out if you are at risk for hepatitis B infection. Hepatitis C Blood testing is recommended for:  Everyone born from 1945 through 1965.  Anyone with known risk factors for hepatitis C. Sexually transmitted infections (STIs)  You should be  screened each year for STIs, including gonorrhea and chlamydia, if: ? You are sexually active and are younger than 81 years of age. ? You are older than 81 years of age and your health care provider tells you that you are at risk for this type of infection. ? Your sexual activity has changed since you were last screened, and you are at increased risk for chlamydia or gonorrhea. Ask your health care provider if you are at risk.  Ask your health care provider about whether you are at high risk for HIV. Your health care provider may recommend a prescription medicine to help prevent HIV infection. If you choose to take medicine to prevent HIV, you should first get tested for HIV. You should then be tested every 3 months for as long as you are taking the medicine. Follow these instructions at home: Lifestyle  Do not use any products that contain nicotine or tobacco, such as cigarettes, e-cigarettes, and chewing tobacco. If you need help quitting, ask your health care provider.  Do not use street drugs.  Do not share needles.  Ask your health care provider for help if you need support or information about quitting drugs. Alcohol use  Do not drink alcohol if your health care provider tells you not to drink.  If you drink alcohol: ? Limit how much you have to 0-2 drinks a day. ? Be aware of how much alcohol is in your drink. In the [place] one drink equals one 12 oz bottle of beer (355 mL), one 5 oz glass of wine (148 mL), or one 1 oz glass of hard liquor (44 mL). General instructions  Schedule regular health, dental, and eye exams.  Stay current with your vaccines.  Tell your health care provider if: ? You often feel depressed. ? You have ever been abused or do not feel safe at home. Summary  Adopting a healthy lifestyle and getting preventive care are important in promoting health and wellness.  Follow your health care provider's instructions about healthy diet, exercising, and getting  tested or screened for diseases.  Follow your health care provider's instructions on monitoring your cholesterol and blood pressure. This information is not intended to replace advice given to you by your health care provider. Make sure you discuss any questions you have with your health care provider. Document Released: 05/11/2008 Document Revised: 11/06/2018 Document Reviewed: 11/06/2018 Elsevier Patient Education  2020 [employer]  

## 2019-08-19 ENCOUNTER — Encounter: Payer: Self-pay | Admitting: Internal Medicine

## 2019-08-19 ENCOUNTER — Other Ambulatory Visit (INDEPENDENT_AMBULATORY_CARE_PROVIDER_SITE_OTHER): Payer: Medicare HMO

## 2019-08-19 ENCOUNTER — Ambulatory Visit (INDEPENDENT_AMBULATORY_CARE_PROVIDER_SITE_OTHER): Payer: Medicare HMO | Admitting: Internal Medicine

## 2019-08-19 ENCOUNTER — Other Ambulatory Visit: Payer: Self-pay

## 2019-08-19 VITALS — BP 122/78 | HR 79 | Temp 98.3°F | Resp 16 | Ht 74.0 in | Wt 167.0 lb

## 2019-08-19 DIAGNOSIS — I10 Essential (primary) hypertension: Secondary | ICD-10-CM | POA: Diagnosis not present

## 2019-08-19 DIAGNOSIS — R739 Hyperglycemia, unspecified: Secondary | ICD-10-CM

## 2019-08-19 DIAGNOSIS — D696 Thrombocytopenia, unspecified: Secondary | ICD-10-CM

## 2019-08-19 DIAGNOSIS — N400 Enlarged prostate without lower urinary tract symptoms: Secondary | ICD-10-CM

## 2019-08-19 DIAGNOSIS — K219 Gastro-esophageal reflux disease without esophagitis: Secondary | ICD-10-CM

## 2019-08-19 DIAGNOSIS — E782 Mixed hyperlipidemia: Secondary | ICD-10-CM

## 2019-08-19 DIAGNOSIS — Z0000 Encounter for general adult medical examination without abnormal findings: Secondary | ICD-10-CM

## 2019-08-19 LAB — LIPID PANEL
Cholesterol: 125 mg/dL (ref 0–200)
HDL: 43.2 mg/dL (ref >39.00)
LDL Cholesterol: 64 mg/dL (ref 0–99)
NonHDL: 81.9
Total CHOL/HDL Ratio: 3
Triglycerides: 88 mg/dL (ref 0.0–149.0)
VLDL: 17.6 mg/dL (ref 0.0–40.0)

## 2019-08-19 LAB — TSH: TSH: 2.18 u[IU]/mL (ref 0.35–4.50)

## 2019-08-19 LAB — CBC WITH DIFFERENTIAL/PLATELET
Basophils Absolute: 0 10*3/uL (ref 0.0–0.1)
Basophils Relative: 0.4 % (ref 0.0–3.0)
Eosinophils Absolute: 0.2 10*3/uL (ref 0.0–0.7)
Eosinophils Relative: 2.4 % (ref 0.0–5.0)
HCT: 35.3 % — ABNORMAL LOW (ref 39.0–52.0)
Hemoglobin: 12.2 g/dL — ABNORMAL LOW (ref 13.0–17.0)
Lymphocytes Relative: 20.2 % (ref 12.0–46.0)
Lymphs Abs: 1.4 10*3/uL (ref 0.7–4.0)
MCHC: 34.4 g/dL (ref 30.0–36.0)
MCV: 92.2 fl (ref 78.0–100.0)
Monocytes Absolute: 0.5 10*3/uL (ref 0.1–1.0)
Monocytes Relative: 7.7 % (ref 3.0–12.0)
Neutro Abs: 4.8 10*3/uL (ref 1.4–7.7)
Neutrophils Relative %: 69.3 % (ref 43.0–77.0)
Platelets: 129 10*3/uL — ABNORMAL LOW (ref 150.0–400.0)
RBC: 3.83 Mil/uL — ABNORMAL LOW (ref 4.22–5.81)
RDW: 12.9 % (ref 11.5–15.5)
WBC: 6.9 10*3/uL (ref 4.0–10.5)

## 2019-08-19 LAB — COMPREHENSIVE METABOLIC PANEL
ALT: 23 U/L (ref 0–53)
AST: 23 U/L (ref 0–37)
Albumin: 3.4 g/dL — ABNORMAL LOW (ref 3.5–5.2)
Alkaline Phosphatase: 109 U/L (ref 39–117)
BUN: 27 mg/dL — ABNORMAL HIGH (ref 6–23)
CO2: 32 mEq/L (ref 19–32)
Calcium: 9.4 mg/dL (ref 8.4–10.5)
Chloride: 104 mEq/L (ref 96–112)
Creatinine, Ser: 1.44 mg/dL (ref 0.40–1.50)
GFR: 56.92 mL/min — ABNORMAL LOW (ref >60.00)
Glucose, Bld: 108 mg/dL — ABNORMAL HIGH (ref 70–99)
Potassium: 4.7 mEq/L (ref 3.5–5.1)
Sodium: 141 mEq/L (ref 135–145)
Total Bilirubin: 0.4 mg/dL (ref 0.2–1.2)
Total Protein: 6.1 g/dL (ref 6.0–8.3)

## 2019-08-19 LAB — HEMOGLOBIN A1C: Hgb A1c MFr Bld: 5.7 % (ref 4.6–6.5)

## 2019-08-19 NOTE — Assessment & Plan Note (Signed)
Check a1c Low sugar / carb diet Stressed regular exercise   

## 2019-08-19 NOTE — Assessment & Plan Note (Signed)
GERD controlled Continue daily medication  

## 2019-08-19 NOTE — Assessment & Plan Note (Signed)
BP well controlled after weight loss Current regimen effective and well tolerated Continue current medications at current doses cmp

## 2019-08-19 NOTE — Assessment & Plan Note (Signed)
cbc

## 2019-08-19 NOTE — Assessment & Plan Note (Signed)
Check lipid panel,cmp ,tsh Continue daily statin Regular exercise and healthy diet encouraged  

## 2019-08-19 NOTE — Assessment & Plan Note (Signed)
Following with urology

## 2019-08-22 ENCOUNTER — Other Ambulatory Visit: Payer: Self-pay | Admitting: Internal Medicine

## 2019-08-26 ENCOUNTER — Other Ambulatory Visit: Payer: Self-pay

## 2019-08-26 MED ORDER — LOSARTAN POTASSIUM 50 MG PO TABS: 50.0000 mg | ORAL_TABLET | Freq: Every day | ORAL | 1 refills | Status: DC

## 2019-09-07 ENCOUNTER — Other Ambulatory Visit: Payer: Self-pay | Admitting: Internal Medicine

## 2019-09-15 ENCOUNTER — Other Ambulatory Visit: Payer: Self-pay | Admitting: Internal Medicine

## 2019-09-19 DIAGNOSIS — N3281 Overactive bladder: Secondary | ICD-10-CM | POA: Diagnosis not present

## 2019-09-19 DIAGNOSIS — N401 Enlarged prostate with lower urinary tract symptoms: Secondary | ICD-10-CM | POA: Diagnosis not present

## 2019-09-19 DIAGNOSIS — R3915 Urgency of urination: Secondary | ICD-10-CM | POA: Diagnosis not present

## 2019-09-19 DIAGNOSIS — N5201 Erectile dysfunction due to arterial insufficiency: Secondary | ICD-10-CM | POA: Diagnosis not present

## 2019-10-24 ENCOUNTER — Other Ambulatory Visit: Payer: Self-pay | Admitting: Internal Medicine

## 2019-11-14 DIAGNOSIS — Z0100 Encounter for examination of eyes and vision without abnormal findings: Secondary | ICD-10-CM | POA: Diagnosis not present

## 2019-11-14 DIAGNOSIS — H52209 Unspecified astigmatism, unspecified eye: Secondary | ICD-10-CM | POA: Diagnosis not present

## 2019-11-14 DIAGNOSIS — E7800 Pure hypercholesterolemia, unspecified: Secondary | ICD-10-CM | POA: Diagnosis not present

## 2019-11-14 DIAGNOSIS — Z135 Encounter for screening for eye and ear disorders: Secondary | ICD-10-CM | POA: Diagnosis not present

## 2019-12-16 ENCOUNTER — Emergency Department (HOSPITAL_COMMUNITY): Payer: Medicare HMO

## 2019-12-16 ENCOUNTER — Inpatient Hospital Stay (HOSPITAL_COMMUNITY)
Admission: EM | Admit: 2019-12-16 | Discharge: 2019-12-19 | DRG: 684 | Disposition: A | Discharge status: Home / Self Care | Payer: Medicare HMO | Attending: Internal Medicine | Admitting: Internal Medicine

## 2019-12-16 ENCOUNTER — Encounter (HOSPITAL_COMMUNITY): Payer: Self-pay

## 2019-12-16 DIAGNOSIS — R55 Syncope and collapse: Secondary | ICD-10-CM | POA: Diagnosis not present

## 2019-12-16 DIAGNOSIS — N179 Acute kidney failure, unspecified: Principal | ICD-10-CM | POA: Diagnosis present

## 2019-12-16 DIAGNOSIS — I10 Essential (primary) hypertension: Secondary | ICD-10-CM | POA: Diagnosis present

## 2019-12-16 DIAGNOSIS — Z20822 Contact with and (suspected) exposure to covid-19: Secondary | ICD-10-CM | POA: Diagnosis not present

## 2019-12-16 DIAGNOSIS — Z8249 Family history of ischemic heart disease and other diseases of the circulatory system: Secondary | ICD-10-CM | POA: Diagnosis not present

## 2019-12-16 DIAGNOSIS — E785 Hyperlipidemia, unspecified: Secondary | ICD-10-CM | POA: Diagnosis present

## 2019-12-16 DIAGNOSIS — I959 Hypotension, unspecified: Secondary | ICD-10-CM | POA: Diagnosis not present

## 2019-12-16 DIAGNOSIS — R52 Pain, unspecified: Secondary | ICD-10-CM | POA: Diagnosis not present

## 2019-12-16 DIAGNOSIS — K5900 Constipation, unspecified: Secondary | ICD-10-CM | POA: Diagnosis present

## 2019-12-16 DIAGNOSIS — R338 Other retention of urine: Secondary | ICD-10-CM | POA: Diagnosis present

## 2019-12-16 DIAGNOSIS — N401 Enlarged prostate with lower urinary tract symptoms: Secondary | ICD-10-CM | POA: Diagnosis present

## 2019-12-16 DIAGNOSIS — K219 Gastro-esophageal reflux disease without esophagitis: Secondary | ICD-10-CM | POA: Diagnosis present

## 2019-12-16 DIAGNOSIS — Z86010 Personal history of colonic polyps: Secondary | ICD-10-CM | POA: Diagnosis not present

## 2019-12-16 DIAGNOSIS — Z87891 Personal history of nicotine dependence: Secondary | ICD-10-CM

## 2019-12-16 DIAGNOSIS — R296 Repeated falls: Secondary | ICD-10-CM | POA: Diagnosis present

## 2019-12-16 DIAGNOSIS — Z79899 Other long term (current) drug therapy: Secondary | ICD-10-CM | POA: Diagnosis not present

## 2019-12-16 DIAGNOSIS — E860 Dehydration: Secondary | ICD-10-CM | POA: Diagnosis present

## 2019-12-16 DIAGNOSIS — R001 Bradycardia, unspecified: Secondary | ICD-10-CM | POA: Diagnosis present

## 2019-12-16 DIAGNOSIS — R079 Chest pain, unspecified: Secondary | ICD-10-CM | POA: Diagnosis not present

## 2019-12-16 DIAGNOSIS — Z6821 Body mass index (BMI) 21.0-21.9, adult: Secondary | ICD-10-CM

## 2019-12-16 DIAGNOSIS — Z8371 Family history of colonic polyps: Secondary | ICD-10-CM

## 2019-12-16 DIAGNOSIS — R42 Dizziness and giddiness: Secondary | ICD-10-CM | POA: Diagnosis not present

## 2019-12-16 DIAGNOSIS — R630 Anorexia: Secondary | ICD-10-CM | POA: Diagnosis present

## 2019-12-16 DIAGNOSIS — R9431 Abnormal electrocardiogram [ECG] [EKG]: Secondary | ICD-10-CM | POA: Diagnosis not present

## 2019-12-16 LAB — CBC WITH DIFFERENTIAL/PLATELET
Abs Immature Granulocytes: 0 10*3/uL (ref 0.00–0.07)
Basophils Absolute: 0 10*3/uL (ref 0.0–0.1)
Basophils Relative: 0 %
Eosinophils Absolute: 0 10*3/uL (ref 0.0–0.5)
Eosinophils Relative: 1 %
HCT: 32.7 % — ABNORMAL LOW (ref 39.0–52.0)
Hemoglobin: 10.7 g/dL — ABNORMAL LOW (ref 13.0–17.0)
Immature Granulocytes: 0 %
Lymphocytes Relative: 17 %
Lymphs Abs: 1 10*3/uL (ref 0.7–4.0)
MCH: 31.5 pg (ref 26.0–34.0)
MCHC: 32.7 g/dL (ref 30.0–36.0)
MCV: 96.2 fL (ref 80.0–100.0)
Monocytes Absolute: 0.5 10*3/uL (ref 0.1–1.0)
Monocytes Relative: 8 %
Neutro Abs: 4.4 10*3/uL (ref 1.7–7.7)
Neutrophils Relative %: 74 %
Platelets: 122 10*3/uL — ABNORMAL LOW (ref 150–400)
RBC: 3.4 MIL/uL — ABNORMAL LOW (ref 4.22–5.81)
RDW: 12.9 % (ref 11.5–15.5)
WBC: 5.9 10*3/uL (ref 4.0–10.5)
nRBC: 0 % (ref 0.0–0.2)

## 2019-12-16 LAB — BASIC METABOLIC PANEL
Anion gap: 8 (ref 5–15)
BUN: 48 mg/dL — ABNORMAL HIGH (ref 8–23)
CO2: 26 mmol/L (ref 22–32)
Calcium: 8.5 mg/dL — ABNORMAL LOW (ref 8.9–10.3)
Chloride: 105 mmol/L (ref 98–111)
Creatinine, Ser: 3.07 mg/dL — ABNORMAL HIGH (ref 0.61–1.24)
GFR calc Af Amer: 21 mL/min — ABNORMAL LOW (ref >60)
GFR calc non Af Amer: 18 mL/min — ABNORMAL LOW (ref >60)
Glucose, Bld: 98 mg/dL (ref 70–99)
Potassium: 4.8 mmol/L (ref 3.5–5.1)
Sodium: 139 mmol/L (ref 135–145)

## 2019-12-16 LAB — TROPONIN I (HIGH SENSITIVITY)
Troponin I (High Sensitivity): 12 ng/L (ref <18)
Troponin I (High Sensitivity): 11 ng/L (ref <18)

## 2019-12-16 MED ORDER — SODIUM CHLORIDE 0.9 % IV SOLN: INTRAVENOUS | Status: DC

## 2019-12-16 MED ORDER — SODIUM CHLORIDE 0.9 % IV BOLUS
1000.0000 mL | Freq: Once | INTRAVENOUS | Status: AC
  Administered 2019-12-16: 21:00:00 1000 mL via INTRAVENOUS

## 2019-12-16 NOTE — H&P (Signed)
History and Physical    Alvin Ingram [id number] DOB: Jan 28, 1936 DOA: 12/16/2019  PCP: Binnie Rail, MD  Patient coming from: Home  Chief Complaint: Syncope  HPI: Alvin Ingram is a 84 y.o. male with medical history significant of GERD, hypertension, hyperlipidemia comes in after having a syncopal episode waiting in the line for an hour and a half to get his Covid vaccine.  Patient reports he has been in normal state of health.  He has not been eating and drinking as well as normal.  He denies any new medications or any over-the-counter NSAID usage.  Denies any fevers nausea vomiting diarrhea or cough.  He denies any shortness of breath.  Today he suddenly passed out after standing for an hour and a half.  This happened to him once over a week ago it was the same he suddenly passed out came to very quickly without any urinary continence or bowel incontinence.  He denies any chest pain.  Patient be referred for admission for acute kidney injury in the setting of syncopal episode.  When EMS arrived today his blood pressure systolics were in the 0000000.  He was given 700 cc of normal saline and blood pressure normalized.  Review of Systems: As per HPI otherwise 10 point review of systems negative.   Past Medical History:  Diagnosis Date  . Cardiac murmur    Aortic  . Diverticulosis 2006  . GERD (gastroesophageal reflux disease)   . History of colonic polyps 2003,2011   Dr Earlean Shawl  . History of syncope 1966   ? stress related  . Hyperlipidemia   . Hypertension    hypertensive response @ stress test    Past Surgical History:  Procedure Laterality Date  . CATARACT EXTRACTION  2003 & 2011   Dr Charise Killian  . COLONOSCOPY  04/2005   tics, no polyps. Dr.Medoff  . COLONOSCOPY W/ POLYPECTOMY  2003, 2011   neg 2006; due 2016. Dr Earlean Shawl     reports that he quit smoking about 52 years ago. He has never used smokeless tobacco. He reports current alcohol use. He reports that he does not use drugs.  No  Known Allergies  Family History  Problem Relation Age of Onset  . Heart attack Mother 23  . Colon polyps Brother   . Pneumonia Brother   . Heart attack Brother 65  . Lung cancer Brother   . Other Father        Benign Brain Tumor  . Hypertension Brother   . Tuberculosis Paternal Uncle   . Other Maternal Grandmother   . Diabetes Neg Hx   . Stroke Neg Hx     Prior to Admission medications   Medication Sig Start Date End Date Taking? Authorizing Provider  amitriptyline (ELAVIL) 25 MG tablet Take 25 mg by mouth at bedtime.   Yes [provider]  aspirin 81 MG tablet Take 81 mg by mouth daily.     Yes [provider]  doxazosin (CARDURA) 8 MG tablet Take 1 tablet by mouth once daily Patient taking differently: Take 8 mg by mouth daily.  09/16/19  Yes Burns, Claudina Lick, MD  losartan (COZAAR) 50 MG tablet Take 1 tablet (50 mg total) by mouth daily. 08/26/19  Yes Burns, Claudina Lick, MD  metoprolol succinate (TOPROL-XL) 100 MG 24 hr tablet TAKE 1 TABLET BY MOUTH ONCE DAILY OR  IMMEDIATELY  FOLLOWING  MEAL. Patient taking differently: Take 100 mg by mouth daily.  09/08/19  Yes Burns, Claudina Lick,  MD  Multiple Vitamins-Minerals (CENTRUM SILVER PO) Take 1 tablet by mouth daily.    Yes [provider]  omeprazole (PRILOSEC) 20 MG capsule Take 1 capsule by mouth once daily Patient taking differently: Take 20 mg by mouth daily.  08/25/19  Yes Burns, Claudina Lick, MD  simvastatin (ZOCOR) 20 MG tablet TAKE 1 TABLET BY MOUTH AT BEDTIME Patient taking differently: Take 20 mg by mouth at bedtime.  10/27/19  Yes Binnie Rail, MD    Physical Exam: Vitals:   12/16/19 1803 12/16/19 1808 12/16/19 1809  BP:   112/76  Pulse:   60  Resp:   18  Temp:   97.6 F (36.4 C)  TempSrc:   Oral  SpO2: 100%  100%  Height:  6' 1.5" (1.867 m)       Constitutional: NAD, calm, comfortable Vitals:   12/16/19 1803 12/16/19 1808 12/16/19 1809  BP:   112/76  Pulse:   60  Resp:   18  Temp:   97.6 F  (36.4 C)  TempSrc:   Oral  SpO2: 100%  100%  Height:  6' 1.5" (1.867 m)    Eyes: PERRL, lids and conjunctivae normal ENMT: Mucous membranes are moist. Posterior pharynx clear of any exudate or lesions.Normal dentition.  Neck: normal, supple, no masses, no thyromegaly Respiratory: clear to auscultation bilaterally, no wheezing, no crackles. Normal respiratory effort. No accessory muscle use.  Cardiovascular: Regular rate and rhythm, no murmurs / rubs / gallops. No extremity edema. 2+ pedal pulses. No carotid bruits.  Abdomen: no tenderness, no masses palpated. No hepatosplenomegaly. Bowel sounds positive.  Musculoskeletal: no clubbing / cyanosis. No joint deformity upper and lower extremities. Good ROM, no contractures. Normal muscle tone.  Skin: no rashes, lesions, ulcers. No induration Neurologic: CN 2-12 grossly intact. Sensation intact, DTR normal. Strength 5/5 in all 4.  Psychiatric: Normal judgment and insight. Alert and oriented x 3. Normal mood.    Labs on Admission: I have personally reviewed following labs and imaging studies  CBC: Recent Labs  Lab 12/16/19 1912  WBC 5.9  NEUTROABS 4.4  HGB 10.7*  HCT 32.7*  MCV 96.2  PLT 123XX123*   Basic Metabolic Panel: Recent Labs  Lab 12/16/19 1912  NA 139  K 4.8  CL 105  CO2 26  GLUCOSE 98  BUN 48*  CREATININE 3.07*  CALCIUM 8.5*   GFR: CrCl cannot be calculated (Unknown ideal weight.). Liver Function Tests: No results for input(s): AST, ALT, ALKPHOS, BILITOT, PROT, ALBUMIN in the last 168 hours. No results for input(s): LIPASE, AMYLASE in the last 168 hours. No results for input(s): AMMONIA in the last 168 hours. Coagulation Profile: No results for input(s): INR, PROTIME in the last 168 hours. Cardiac Enzymes: No results for input(s): CKTOTAL, CKMB, CKMBINDEX, TROPONINI in the last 168 hours. BNP (last 3 results) No results for input(s): PROBNP in the last 8760 hours. HbA1C: No results for input(s): HGBA1C in the  last 72 hours. CBG: No results for input(s): GLUCAP in the last 168 hours. Lipid Profile: No results for input(s): CHOL, HDL, LDLCALC, TRIG, CHOLHDL, LDLDIRECT in the last 72 hours. Thyroid Function Tests: No results for input(s): TSH, T4TOTAL, FREET4, T3FREE, THYROIDAB in the last 72 hours. Anemia Panel: No results for input(s): VITAMINB12, FOLATE, FERRITIN, TIBC, IRON, RETICCTPCT in the last 72 hours. Urine analysis: No results found for: COLORURINE, APPEARANCEUR, LABSPEC, PHURINE, GLUCOSEU, HGBUR, BILIRUBINUR, KETONESUR, PROTEINUR, UROBILINOGEN, NITRITE, LEUKOCYTESUR Sepsis Labs: !!!!!!!!!!!!!!!!!!!!!!!!!!!!!!!!!!!!!!!!!!!! @LABRCNTIP (procalcitonin:4,lacticidven:4) )No results found for this or any  previous visit (from the past 240 hour(s)).   Radiological Exams on Admission: DG Chest 2 View  Result Date: 12/16/2019 CLINICAL DATA:  Syncopal episode. EXAM: CHEST - 2 VIEW COMPARISON:  December 20, 2012 FINDINGS: The lungs are mildly hyperinflated. There is no evidence of acute infiltrate, pleural effusion or pneumothorax. The heart size and mediastinal contours are within normal limits. The visualized skeletal structures are unremarkable. IMPRESSION: No active cardiopulmonary disease. Electronically Signed   By: Virgina Norfolk M.D.   On: 12/16/2019 19:03    EKG: Independently reviewed.  Normal sinus rhythm no acute changes Chest x-ray reviewed no edema or infiltrate Old chart reviewed Case discussed with EDP  Assessment/Plan 84 year old male with syncopal episode and acute kidney injury Principal Problem:   Syncope-check orthostatics.  Give a liter of normal saline and then 125 cc an hour overnight.  Check orthostatics again in the morning.  Continue cardiac monitoring.  We will also check cardiac echo.  Has a history of an aortic valvular problem.  Active Problems:   AKI (acute kidney injury) (HCC)-creatinine bump up to 3 from 1.4 baseline a couple months ago.  Urinating fine.   Urinalysis negative.  IV fluids overnight.  Likely mostly due to dehydration.    Hyperlipidemia-noted    Essential hypertension-hold blood pressure meds at this time      DVT prophylaxis: SCDs Code Status: Full Family Communication: None Disposition Plan: 1 to 3 days Consults called: None Admission status: Admission   Mirranda Monrroy A MD [facility]  If 7PM-7AM, please contact night-coverage [email or web address] Password [facility]  12/16/2019, 8:44 PM

## 2019-12-16 NOTE — ED Triage Notes (Signed)
Per EMS, Pt was standing in line to receive his covid vaccine (first dose). Pt then had a syncopal episode and fell down. Denies hitting head, fell on his left side. No signs of trauma, or injury. Complains of pain in left forearm. Pressure on scene was intially 67/42, following 700 mls of NS pts pressure was 117/72.

## 2019-12-16 NOTE — ED Provider Notes (Signed)
[facility] DEPT Provider Note   CSN: [id number] Arrival date & time: 12/16/19  1753     History Chief Complaint  Patient presents with  . Loss of Consciousness    Alvin Ingram is a 84 y.o. male.  84 year old male who presents after having a syncopal event.  Patient states that he had been standing in line for about an hour and a half to receive his Covid vaccine when he became dizzy and fell to the ground.  No injury from this.  States that he also fell 3 days ago at home.  Denies any recent illnesses.  No chest pain or shortness of breath a few days ago or today.  No recent changes to his medications.  EMS was called and patient's blood pressure initially was 60.  Was given 700 cc of saline and blood pressure improved to 117/72.  He did receive his Covid vaccine.        Past Medical History:  Diagnosis Date  . Cardiac murmur    Aortic  . Diverticulosis 2006  . GERD (gastroesophageal reflux disease)   . History of colonic polyps 2003,2011   Dr Earlean Shawl  . History of syncope 1966   ? stress related  . Hyperlipidemia   . Hypertension    hypertensive response @ stress test    Patient Active Problem List   Diagnosis Date Noted  . Hyperglycemia 08/18/2019  . BPH (benign prostatic hyperplasia) 05/31/2010  . THROMBOCYTOPENIA, CHRONIC 05/24/2009  . PREMATURE ATRIAL CONTRACTIONS 05/24/2009  . GERD 05/21/2008  . Nocturia 05/21/2008  . Hyperlipidemia 04/23/2007  . Essential hypertension 04/23/2007  . History of colonic polyps 03/15/2007    Past Surgical History:  Procedure Laterality Date  . CATARACT EXTRACTION  2003 & 2011   Dr Charise Killian  . COLONOSCOPY  04/2005   tics, no polyps. Dr.Medoff  . COLONOSCOPY W/ POLYPECTOMY  2003, 2011   neg 2006; due 2016. Dr Earlean Shawl       Family History  Problem Relation Age of Onset  . Heart attack Mother 58  . Colon polyps Brother   . Pneumonia Brother   . Heart attack Brother 34  . Lung cancer Brother    . Other Father        Benign Brain Tumor  . Hypertension Brother   . Tuberculosis Paternal Uncle   . Other Maternal Grandmother   . Diabetes Neg Hx   . Stroke Neg Hx     Social History   Tobacco Use  . Smoking status: Former Smoker    Quit date: 11/28/1967    Years since quitting: 52.0  . Smokeless tobacco: Never Used  . Tobacco comment: smoked 1955-1969 , up to 1 ppd  Substance Use Topics  . Alcohol use: Yes    Comment: RARELY  . Drug use: No    Home Medications Prior to Admission medications   Medication Sig Start Date End Date Taking? Authorizing Provider  amitriptyline (ELAVIL) 25 MG tablet Take 25 mg by mouth at bedtime.    [provider]  aspirin 81 MG tablet Take 81 mg by mouth daily.      [provider]  doxazosin (CARDURA) 8 MG tablet Take 1 tablet by mouth once daily 09/16/19   Binnie Rail, MD  losartan (COZAAR) 50 MG tablet Take 1 tablet (50 mg total) by mouth daily. 08/26/19   Binnie Rail, MD  metoprolol succinate (TOPROL-XL) 100 MG 24 hr tablet TAKE 1 TABLET BY MOUTH ONCE  DAILY OR  IMMEDIATELY  FOLLOWING  MEAL. 09/08/19   Binnie Rail, MD  Multiple Vitamins-Minerals (CENTRUM SILVER PO) Take by mouth daily.      [provider]  omeprazole (PRILOSEC) 20 MG capsule Take 1 capsule by mouth once daily 08/25/19   Binnie Rail, MD  simvastatin (ZOCOR) 20 MG tablet TAKE 1 TABLET BY MOUTH AT BEDTIME 10/27/19   Binnie Rail, MD    Allergies    Patient has no known allergies.  Review of Systems   Review of Systems  All other systems reviewed and are negative.   Physical Exam Updated Vital Signs SpO2 100%   Physical Exam Vitals and nursing note reviewed.  Constitutional:      General: He is not in acute distress.    Appearance: Normal appearance. He is well-developed. He is not toxic-appearing.  HENT:     Head: Normocephalic and atraumatic.  Eyes:     General: Lids are normal.     Conjunctiva/sclera: Conjunctivae  normal.     Pupils: Pupils are equal, round, and reactive to light.  Neck:     Thyroid: No thyroid mass.     Trachea: No tracheal deviation.  Cardiovascular:     Rate and Rhythm: Normal rate and regular rhythm.     Heart sounds: Normal heart sounds. No murmur. No gallop.   Pulmonary:     Effort: Pulmonary effort is normal. No respiratory distress.     Breath sounds: Normal breath sounds. No stridor. No decreased breath sounds, wheezing, rhonchi or rales.  Abdominal:     General: Bowel sounds are normal. There is no distension.     Palpations: Abdomen is soft.     Tenderness: There is no abdominal tenderness. There is no rebound.  Musculoskeletal:        General: No tenderness. Normal range of motion.     Cervical back: Normal range of motion and neck supple.  Skin:    General: Skin is warm and dry.     Findings: No abrasion or rash.  Neurological:     Mental Status: He is alert and oriented to person, place, and time.     GCS: GCS eye subscore is 4. GCS verbal subscore is 5. GCS motor subscore is 6.     Cranial Nerves: No cranial nerve deficit.     Sensory: No sensory deficit.  Psychiatric:        Speech: Speech normal.        Behavior: Behavior normal.     ED Results / Procedures / Treatments   Labs (all labs ordered are listed, but only abnormal results are displayed) Labs Reviewed  CBC WITH DIFFERENTIAL/PLATELET  BASIC METABOLIC PANEL  TROPONIN I (HIGH SENSITIVITY)    EKG EKG Interpretation  Date/Time:  Tuesday December 16 2019 18:08:08 EST Ventricular Rate:  60 PR Interval:    QRS Duration: 84 QT Interval:  438 QTC Calculation: 438 R Axis:   19 Text Interpretation: Sinus or ectopic atrial rhythm Low voltage, extremity leads No significant change since last tracing Confirmed by Lacretia Leigh ([id number]) on 12/16/2019 7:50:16 PM   Radiology No results found.  Procedures Procedures (including critical care time)  Medications Ordered in ED Medications  0.9 %   sodium chloride infusion (has no administration in time range)    ED Course  I have reviewed the triage vital signs and the nursing notes.  Pertinent labs & imaging results that were available during my care of the patient  were reviewed by me and considered in my medical decision making (see chart for details).    MDM Rules/Calculators/A&P                     Patient has evidence of acute kidney injury with creatinine up to 3.  We will continue IV hydrate here and admit to the hospital  Final Clinical Impression(s) / ED Diagnoses Final diagnoses:  Chest pain    Rx / DC Orders ED Discharge Orders    None       Lacretia Leigh, MD 12/16/19 2021

## 2019-12-17 ENCOUNTER — Inpatient Hospital Stay (HOSPITAL_COMMUNITY): Payer: Medicare HMO

## 2019-12-17 ENCOUNTER — Encounter (HOSPITAL_COMMUNITY): Payer: Self-pay | Admitting: Internal Medicine

## 2019-12-17 ENCOUNTER — Other Ambulatory Visit: Payer: Self-pay

## 2019-12-17 DIAGNOSIS — N179 Acute kidney failure, unspecified: Principal | ICD-10-CM

## 2019-12-17 DIAGNOSIS — R55 Syncope and collapse: Secondary | ICD-10-CM

## 2019-12-17 DIAGNOSIS — R9431 Abnormal electrocardiogram [ECG] [EKG]: Secondary | ICD-10-CM

## 2019-12-17 LAB — CBC
HCT: 30 % — ABNORMAL LOW (ref 39.0–52.0)
Hemoglobin: 10.1 g/dL — ABNORMAL LOW (ref 13.0–17.0)
MCH: 32 pg (ref 26.0–34.0)
MCHC: 33.7 g/dL (ref 30.0–36.0)
MCV: 94.9 fL (ref 80.0–100.0)
Platelets: 110 10*3/uL — ABNORMAL LOW (ref 150–400)
RBC: 3.16 MIL/uL — ABNORMAL LOW (ref 4.22–5.81)
RDW: 12.8 % (ref 11.5–15.5)
WBC: 6.1 10*3/uL (ref 4.0–10.5)
nRBC: 0 % (ref 0.0–0.2)

## 2019-12-17 LAB — BASIC METABOLIC PANEL
Anion gap: 8 (ref 5–15)
BUN: 49 mg/dL — ABNORMAL HIGH (ref 8–23)
CO2: 24 mmol/L (ref 22–32)
Calcium: 8.3 mg/dL — ABNORMAL LOW (ref 8.9–10.3)
Chloride: 106 mmol/L (ref 98–111)
Creatinine, Ser: 2.77 mg/dL — ABNORMAL HIGH (ref 0.61–1.24)
GFR calc Af Amer: 24 mL/min — ABNORMAL LOW (ref >60)
GFR calc non Af Amer: 21 mL/min — ABNORMAL LOW (ref >60)
Glucose, Bld: 84 mg/dL (ref 70–99)
Potassium: 4.3 mmol/L (ref 3.5–5.1)
Sodium: 138 mmol/L (ref 135–145)

## 2019-12-17 LAB — HEPATIC FUNCTION PANEL
ALT: 26 U/L (ref 0–44)
AST: 29 U/L (ref 15–41)
Albumin: 2.3 g/dL — ABNORMAL LOW (ref 3.5–5.0)
Alkaline Phosphatase: 86 U/L (ref 38–126)
Bilirubin, Direct: 0.1 mg/dL (ref 0.0–0.2)
Indirect Bilirubin: 1 mg/dL — ABNORMAL HIGH (ref 0.3–0.9)
Total Bilirubin: 1.1 mg/dL (ref 0.3–1.2)
Total Protein: 4.5 g/dL — ABNORMAL LOW (ref 6.5–8.1)

## 2019-12-17 LAB — SARS CORONAVIRUS 2 (TAT 6-24 HRS): SARS Coronavirus 2: NEGATIVE

## 2019-12-17 LAB — VITAMIN B12: Vitamin B-12: 1753 pg/mL — ABNORMAL HIGH (ref 180–914)

## 2019-12-17 LAB — ECHOCARDIOGRAM COMPLETE: Height: 73.5 in

## 2019-12-17 MED ORDER — HEPARIN SODIUM (PORCINE) 5000 UNIT/ML IJ SOLN
5000.0000 [IU] | Freq: Three times a day (TID) | INTRAMUSCULAR | Status: DC
  Administered 2019-12-17 – 2019-12-19 (×7): 5000 [IU] via SUBCUTANEOUS
  Filled 2019-12-17 (×6): qty 1

## 2019-12-17 MED ORDER — SENNA 8.6 MG PO TABS
1.0000 | ORAL_TABLET | Freq: Every day | ORAL | Status: DC
  Administered 2019-12-17 – 2019-12-19 (×3): 8.6 mg via ORAL
  Filled 2019-12-17 (×3): qty 1

## 2019-12-17 MED ORDER — SIMVASTATIN 20 MG PO TABS
20.0000 mg | ORAL_TABLET | Freq: Every day | ORAL | Status: DC
  Administered 2019-12-17 – 2019-12-18 (×2): 20 mg via ORAL
  Filled 2019-12-17 (×3): qty 1

## 2019-12-17 MED ORDER — ASPIRIN EC 81 MG PO TBEC
81.0000 mg | DELAYED_RELEASE_TABLET | Freq: Every day | ORAL | Status: DC
  Administered 2019-12-17 – 2019-12-19 (×3): 81 mg via ORAL
  Filled 2019-12-17 (×3): qty 1

## 2019-12-17 MED ORDER — POLYETHYLENE GLYCOL 3350 17 G PO PACK
17.0000 g | PACK | Freq: Every day | ORAL | Status: DC
  Administered 2019-12-17 – 2019-12-19 (×3): 17 g via ORAL
  Filled 2019-12-17 (×3): qty 1

## 2019-12-17 MED ORDER — PANTOPRAZOLE SODIUM 40 MG PO TBEC
40.0000 mg | DELAYED_RELEASE_TABLET | Freq: Every day | ORAL | Status: DC
  Administered 2019-12-17 – 2019-12-19 (×3): 40 mg via ORAL
  Filled 2019-12-17 (×3): qty 1

## 2019-12-17 NOTE — Progress Notes (Signed)
PROGRESS NOTE    Alvin Ingram  [id number] DOB: [birth year] DOA: 12/16/2019 PCP: Binnie Rail, MD    Brief Narrative:  84 year old male with PMH of GERD, hypertension, hyperlipidemia, BPH who presented to the ED for a syncopal episode that occurred while he was waiting in queue to get his Covid vaccine.  He was found to have AKI with creatinine of 3.07, orthostatics positive.  Received 2 L IV fluid bolus in the ED and started on maintenance IV fluids.  Admitted for syncope work-up   Assessment & Plan:   Principal Problem:   Syncope Active Problems:   Hyperlipidemia   Essential hypertension   AKI (acute kidney injury) ([place])   Syncope Orthostatic vitals positive in the ED.  EKG QTC 438, low voltage in all leads. Echocardiogram ordered showed no significant valvular disease and preserved LVEF.  Has grade 1 diastolic dysfunction.  COVID-19 PCR negative. Positive orthostatic vitals likely in setting of dehydration due to poor p.o. intake, ?  Medication induced on alpha-blocker Terazosin. On amitriptyline for more than 5 years. ?  Arrhythmia.  On telemetry Continue IV fluids for now.  Terazosin held. Bradycardic on admission which has since resolved.  Metoprolol on hold.  Acute kidney injury Baseline creatinine ~1.1. Creatinine elevated to 3.07 on admission which has improved with fluids.  Thought to be prerenal in setting of poor p.o. intake. Renal ultrasound showed no hydronephrosis. S/p 2 L IV fluid bolus in the ED.  Continue IV fluids  Hypertension Normotensive currently with blood pressure dropping to systolic in the 0000000 while standing. Hold losartan, metoprolol  Poor appetite Poor historian.  Former smoker.  Unclear timeline of poor appetite but appears to be several months.  His major complaints appear to be constipation and difficulty urinating. Last colonoscopy on chart appears to be in 2006 with a due in 2016.  Unclear if he had one. No recent PSA on chart. Will need  age-appropriate cancer screening as outpatient  BPH On Terazosin at home.  Health as it may be contributing to orthostatic hypotension.  DVT prophylaxis: Heparin SQ Code Status: Full code  Family Communication:  Disposition Plan: Pending medical stability, likely home   Consultants:   None  Procedures:  None  Antimicrobials:   None   Subjective: Poor historian.  Difficulty urinating and constipation appear to be bothering him the most.  Notes prior episode of syncope.  Objective: Vitals:   12/17/19 0530 12/17/19 0600 12/17/19 0700 12/17/19 0730  BP: 119/63 (!) 107/54 113/61 (!) 104/55  Pulse: 65 61 65 62  Resp: 17  18   Temp:      TempSrc:      SpO2: 100% 100% 100% 100%  Height:        Intake/Output Summary (Last 24 hours) at 12/17/2019 0830 Last data filed at 12/16/2019 2258 Gross per 24 hour  Intake 1000 ml  Output --  Net 1000 ml   Filed Weights   12/17/19 1412  Weight: 75.3 kg    Examination:  General exam: Appears calm and comfortable thin elderly male Respiratory system: Clear to auscultation. Respiratory effort normal. Cardiovascular system: S1 & S2 heard, RRR. No JVD, murmurs. No pedal edema. Gastrointestinal system: Abdomen is nondistended, soft and nontender. No organomegaly or masses felt. Normal bowel sounds heard. Central nervous system: Alert and oriented. No focal neurological deficits. Extremities: Symmetric 5 x 5 power. Skin: No rashes, lesions or ulcers Psychiatry: Judgement and insight appear normal. Mood & affect appropriate.     Data  Reviewed: I have personally reviewed following labs and imaging studies  CBC: Recent Labs  Lab 12/16/19 1912 12/17/19 0330  WBC 5.9 6.1  NEUTROABS 4.4  --   HGB 10.7* 10.1*  HCT 32.7* 30.0*  MCV 96.2 94.9  PLT 122* [id number]*   Basic Metabolic Panel: Recent Labs  Lab 12/16/19 1912 12/17/19 0330  NA 139 138  K 4.8 4.3  CL 105 106  CO2 26 24  GLUCOSE 98 84  BUN 48* 49*  CREATININE 3.07*  2.77*  CALCIUM 8.5* 8.3*   GFR: CrCl cannot be calculated (Unknown ideal weight.). Liver Function Tests: No results for input(s): AST, ALT, ALKPHOS, BILITOT, PROT, ALBUMIN in the last 168 hours. No results for input(s): LIPASE, AMYLASE in the last 168 hours. No results for input(s): AMMONIA in the last 168 hours. Coagulation Profile: No results for input(s): INR, PROTIME in the last 168 hours. Cardiac Enzymes: No results for input(s): CKTOTAL, CKMB, CKMBINDEX, TROPONINI in the last 168 hours. BNP (last 3 results) No results for input(s): PROBNP in the last 8760 hours. HbA1C: No results for input(s): HGBA1C in the last 72 hours. CBG: No results for input(s): GLUCAP in the last 168 hours. Lipid Profile: No results for input(s): CHOL, HDL, LDLCALC, TRIG, CHOLHDL, LDLDIRECT in the last 72 hours. Thyroid Function Tests: No results for input(s): TSH, T4TOTAL, FREET4, T3FREE, THYROIDAB in the last 72 hours. Anemia Panel: No results for input(s): VITAMINB12, FOLATE, FERRITIN, TIBC, IRON, RETICCTPCT in the last 72 hours. Sepsis Labs: No results for input(s): PROCALCITON, LATICACIDVEN in the last 168 hours.  Recent Results (from the past 240 hour(s))  SARS CORONAVIRUS 2 (TAT 6-24 HRS) Nasopharyngeal Nasopharyngeal Swab     Status: None   Collection Time: 12/16/19  8:54 PM   Specimen: Nasopharyngeal Swab  Result Value Ref Range Status   SARS Coronavirus 2 NEGATIVE NEGATIVE Final    Comment: (NOTE) SARS-CoV-2 target nucleic acids are NOT DETECTED. The SARS-CoV-2 RNA is generally detectable in upper and lower respiratory specimens during the acute phase of infection. Negative results do not preclude SARS-CoV-2 infection, do not rule out co-infections with other pathogens, and should not be used as the sole basis for treatment or other patient management decisions. Negative results must be combined with clinical observations, patient history, and epidemiological information. The  expected result is Negative. Fact Sheet for Patients: SugarRoll.be Fact Sheet for Healthcare Providers: https://www.woods-mathews.com/ This test is not yet approved or cleared by the [place] FDA and  has been authorized for detection and/or diagnosis of SARS-CoV-2 by FDA under an Emergency Use Authorization (EUA). This EUA will remain  in effect (meaning this test can be used) for the duration of the COVID-19 declaration under Section 56 4(b)(1) of the Act, 21 U.S.C. section 360bbb-3(b)(1), unless the authorization is terminated or revoked sooner. Performed at [facility], [place] [address], [place], [place] [address]          Radiology Studies: DG Chest 2 View  Result Date: 12/16/2019 CLINICAL DATA:  Syncopal episode. EXAM: CHEST - 2 VIEW COMPARISON:  December 20, 2012 FINDINGS: The lungs are mildly hyperinflated. There is no evidence of acute infiltrate, pleural effusion or pneumothorax. The heart size and mediastinal contours are within normal limits. The visualized skeletal structures are unremarkable. IMPRESSION: No active cardiopulmonary disease. Electronically Signed   By: Virgina Norfolk M.D.   On: 12/16/2019 19:03     Scheduled Meds: Continuous Infusions: . sodium chloride 125 mL/hr at 12/17/19 0345  . sodium  chloride 125 mL/hr at 12/17/19 0345     LOS: 1 day    Time spent: Spent more than 30 minutes in coordinating care for this patient including bedside patient care.   Lucky Cowboy, MD [facility] If 7PM-7AM, please contact night-coverage 12/17/2019, 8:30 AM

## 2019-12-17 NOTE — Plan of Care (Signed)
  Problem: Education: Goal: Knowledge of condition and prescribed therapy will improve Outcome: Progressing   Problem: Cardiac: Goal: Will achieve and/or maintain adequate cardiac output Outcome: Progressing   Problem: Physical Regulation: Goal: Complications related to the disease process, condition or treatment will be avoided or minimized Outcome: Progressing   

## 2019-12-17 NOTE — Progress Notes (Signed)
  Echocardiogram 2D Echocardiogram has been performed.  Jannett Celestine 12/17/2019, 9:15 AM

## 2019-12-17 NOTE — ED Triage Notes (Signed)
ED TO INPATIENT HANDOFF REPORT  ED Nurse Name and Phone #:   S Name/Age/Gender Alvin Ingram 84 y.o. male Room/Bed: WA23/WA23  Code Status   Code Status: Full Code  Home/SNF/Other Home Patient oriented to: self, place, time and situation Is this baseline? Yes   Triage Complete: Triage complete  Chief Complaint Syncope [R55]  Triage Note Per EMS, Pt was standing in line to receive his covid vaccine (first dose). Pt then had a syncopal episode and fell down. Denies hitting head, fell on his left side. No signs of trauma, or injury. Complains of pain in left forearm. Pressure on scene was intially 67/42, following 700 mls of NS pts pressure was 117/72.     Allergies No Known Allergies  Level of Care/Admitting Diagnosis ED Disposition    ED Disposition Condition Comment   Admit  Hospital Area: [facility] [[id number]]  Level of Care: Telemetry [5]  Admit to tele based on following criteria: Other see comments  Comments: syncope, look for arrhythmias  Covid Evaluation: Confirmed COVID Negative  Date Laboratory Confirmed COVID Negative: 12/16/2019  Diagnosis: Syncope [206001]  Admitting Physician: Lucky Cowboy [id number]  Attending Physician: Lucky Cowboy [id number]  Estimated length of stay: 3 - 4 days  Certification:: I certify this patient will need inpatient services for at least 2 midnights       B Medical/Surgery History Past Medical History:  Diagnosis Date  . Cardiac murmur    Aortic  . Diverticulosis 2006  . GERD (gastroesophageal reflux disease)   . History of colonic polyps 2003,2011   Dr Earlean Shawl  . History of syncope 1966   ? stress related  . Hyperlipidemia   . Hypertension    hypertensive response @ stress test   Past Surgical History:  Procedure Laterality Date  . CATARACT EXTRACTION  2003 & 2011   Dr Charise Killian  . COLONOSCOPY  04/2005   tics, no polyps. Dr.Medoff  . COLONOSCOPY W/ POLYPECTOMY  2003, 2011   neg 2006; due 2016.  Dr Earlean Shawl     A IV Location/Drains/Wounds Patient Lines/Drains/Airways Status   Active Line/Drains/Airways    Name:   Placement date:   Placement time:   Site:   Days:   Peripheral IV 12/16/19 Anterior;Left Forearm   12/16/19    1803    Forearm   1          Intake/Output Last 24 hours  Intake/Output Summary (Last 24 hours) at 12/17/2019 1319 Last data filed at 12/16/2019 2258 Gross per 24 hour  Intake 1000 ml  Output --  Net 1000 ml    Labs/Imaging Results for orders placed or performed during the hospital encounter of 12/16/19 (from the past 48 hour(s))  CBC with Differential/Platelet     Status: Abnormal   Collection Time: 12/16/19  7:12 PM  Result Value Ref Range   WBC 5.9 4.0 - 10.5 K/uL   RBC 3.40 (L) 4.22 - 5.81 MIL/uL   Hemoglobin 10.7 (L) 13.0 - 17.0 g/dL   HCT 32.7 (L) 39.0 - 52.0 %   MCV 96.2 80.0 - 100.0 fL   MCH 31.5 26.0 - 34.0 pg   MCHC 32.7 30.0 - 36.0 g/dL   RDW 12.9 11.5 - 15.5 %   Platelets 122 (L) 150 - 400 K/uL   nRBC 0.0 0.0 - 0.2 %   Neutrophils Relative % 74 %   Neutro Abs 4.4 1.7 - 7.7 K/uL   Lymphocytes Relative 17 %   Lymphs Abs  1.0 0.7 - 4.0 K/uL   Monocytes Relative 8 %   Monocytes Absolute 0.5 0.1 - 1.0 K/uL   Eosinophils Relative 1 %   Eosinophils Absolute 0.0 0.0 - 0.5 K/uL   Basophils Relative 0 %   Basophils Absolute 0.0 0.0 - 0.1 K/uL   Immature Granulocytes 0 %   Abs Immature Granulocytes 0.00 0.00 - 0.07 K/uL    Comment: Performed at [facility], [place] [address], [place], [place] [id number]  Basic metabolic panel     Status: Abnormal   Collection Time: 12/16/19  7:12 PM  Result Value Ref Range   Sodium 139 135 - 145 mmol/L   Potassium 4.8 3.5 - 5.1 mmol/L   Chloride 105 98 - 111 mmol/L   CO2 26 22 - 32 mmol/L   Glucose, Bld 98 70 - 99 mg/dL   BUN 48 (H) 8 - 23 mg/dL   Creatinine, Ser 3.07 (H) 0.61 - 1.24 mg/dL   Calcium 8.5 (L) 8.9 - 10.3 mg/dL   GFR calc non Af Amer 18 (L) >60 mL/min   GFR calc Af  Amer 21 (L) >60 mL/min   Anion gap 8 5 - 15    Comment: Performed at [place] [address], [place], [place] [address]  Troponin I (High Sensitivity)     Status: None   Collection Time: 12/16/19  7:12 PM  Result Value Ref Range   Troponin I (High Sensitivity) 12 <18 ng/L    Comment: (NOTE) Elevated high sensitivity troponin I (hsTnI) values and significant  changes across serial measurements may suggest ACS but many other  chronic and acute conditions are known to elevate hsTnI results.  Refer to the "Links" section for chest pain algorithms and additional  guidance. Performed at [facility], [place] [address], [place], [place] [address]   Troponin I (High Sensitivity)     Status: None   Collection Time: 12/16/19  8:54 PM  Result Value Ref Range   Troponin I (High Sensitivity) 11 <18 ng/L    Comment: (NOTE) Elevated high sensitivity troponin I (hsTnI) values and significant  changes across serial measurements may suggest ACS but many other  chronic and acute conditions are known to elevate hsTnI results.  Refer to the "Links" section for chest pain algorithms and additional  guidance. Performed at [facility], [place] [address], [place], [place] [address]   SARS CORONAVIRUS 2 (TAT 6-24 HRS) Nasopharyngeal Nasopharyngeal Swab     Status: None   Collection Time: 12/16/19  8:54 PM   Specimen: Nasopharyngeal Swab  Result Value Ref Range   SARS Coronavirus 2 NEGATIVE NEGATIVE    Comment: (NOTE) SARS-CoV-2 target nucleic acids are NOT DETECTED. The SARS-CoV-2 RNA is generally detectable in upper and lower respiratory specimens during the acute phase of infection. Negative results do not preclude SARS-CoV-2 infection, do not rule out co-infections with other pathogens, and should not be used as the sole basis for treatment or other patient management decisions. Negative results must be combined with clinical  observations, patient history, and epidemiological information. The expected result is Negative. Fact Sheet for Patients: SugarRoll.be Fact Sheet for Healthcare Providers: https://www.woods-mathews.com/ This test is not yet approved or cleared by the [place] FDA and  has been authorized for detection and/or diagnosis of SARS-CoV-2 by FDA under an Emergency Use Authorization (EUA). This EUA will remain  in effect (meaning this test can be used) for the duration of the COVID-19 declaration  under Section 56 4(b)(1) of the Act, 21 U.S.C. section 360bbb-3(b)(1), unless the authorization is terminated or revoked sooner. Performed at [facility], [place] [address] [place], [place] [id number]   Basic metabolic panel     Status: Abnormal   Collection Time: 12/17/19  3:30 AM  Result Value Ref Range   Sodium 138 135 - 145 mmol/L   Potassium 4.3 3.5 - 5.1 mmol/L   Chloride 106 98 - 111 mmol/L   CO2 24 22 - 32 mmol/L   Glucose, Bld 84 70 - 99 mg/dL   BUN 49 (H) 8 - 23 mg/dL   Creatinine, Ser 2.77 (H) 0.61 - 1.24 mg/dL   Calcium 8.3 (L) 8.9 - 10.3 mg/dL   GFR calc non Af Amer 21 (L) >60 mL/min   GFR calc Af Amer 24 (L) >60 mL/min   Anion gap 8 5 - 15    Comment: Performed at [place] [place] [address], [place], [place] [address]  CBC     Status: Abnormal   Collection Time: 12/17/19  3:30 AM  Result Value Ref Range   WBC 6.1 4.0 - 10.5 K/uL   RBC 3.16 (L) 4.22 - 5.81 MIL/uL   Hemoglobin 10.1 (L) 13.0 - 17.0 g/dL   HCT 30.0 (L) 39.0 - 52.0 %   MCV 94.9 80.0 - 100.0 fL   MCH 32.0 26.0 - 34.0 pg   MCHC 33.7 30.0 - 36.0 g/dL   RDW 12.8 11.5 - 15.5 %   Platelets 110 (L) 150 - 400 K/uL    Comment: REPEATED TO VERIFY Immature Platelet Fraction may be clinically indicated, consider ordering this additional test [id number]    nRBC 0.0 0.0 - 0.2 %    Comment: Performed at [facility], [place] [address] [place], [place] [address]  Hepatic function panel     Status: Abnormal   Collection Time: 12/17/19  3:30 AM  Result Value Ref Range   Total Protein 4.5 (L) 6.5 - 8.1 g/dL   Albumin 2.3 (L) 3.5 - 5.0 g/dL   AST 29 15 - 41 U/L   ALT 26 0 - 44 U/L   Alkaline Phosphatase 86 38 - 126 U/L   Total Bilirubin 1.1 0.3 - 1.2 mg/dL   Bilirubin, Direct 0.1 0.0 - 0.2 mg/dL   Indirect Bilirubin 1.0 (H) 0.3 - 0.9 mg/dL    Comment: Performed at [facility], [place] [address] [place],  [address]   DG Chest 2 View  Result Date: 12/16/2019 CLINICAL DATA:  Syncopal episode. EXAM: CHEST - 2 VIEW COMPARISON:  December 20, 2012 FINDINGS: The lungs are mildly hyperinflated. There is no evidence of acute infiltrate, pleural effusion or pneumothorax. The heart size and mediastinal contours are within normal limits. The visualized skeletal structures are unremarkable. IMPRESSION: No active cardiopulmonary disease. Electronically Signed   By: Virgina Norfolk M.D.   On: 12/16/2019 19:03   US RENAL  Result Date: 12/17/2019 CLINICAL DATA:  Acute kidney injury. EXAM: RENAL / URINARY TRACT ULTRASOUND COMPLETE COMPARISON:  05/02/2007 abdominal ultrasound. FINDINGS: Right Kidney: Renal measurements: 10.4 x 4.2 x 4.7 cm = volume: 106 mL. Mildly increased renal echogenicity. Normal cortical thickness. Renal cysts, including at maximally 3.0 x 2.0 x 2.9 cm. Left Kidney: Renal measurements: 11.2 x 6.3 x 5.7 cm = volume: 211 mL. Mildly increased renal echogenicity. Normal cortical thickness. Interpolar left renal cyst of 2.9 cm. Bladder: Within normal limits. Incidental note is made of  moderate prostatomegaly. Other: None. IMPRESSION: 1.  No hydronephrosis. 2. Mildly increased renal echogenicity, suggesting medical renal disease. Electronically Signed   By: Abigail Miyamoto M.D.   On: 12/17/2019 11:02   ECHOCARDIOGRAM COMPLETE  Result Date: 12/17/2019   ECHOCARDIOGRAM REPORT   Patient Name:   Alvin Ingram Date  of Exam: 12/17/2019 Medical Rec #:  [id number]    Height:       73.5 in Accession #:    [id number]   Weight:       167.0 lb Date of Birth:  Apr 01, 1936     BSA:          2.00 m Patient Age:    39 years     BP:           144/69 mmHg Patient Gender: M            HR:           85 bpm. Exam Location:  Inpatient Procedure: 2D Echo Indications:    abnormal ECG  History:        Patient has no prior history of Echocardiogram examinations.                 Risk Factors:Hypertension and Diabetes.  Sonographer:    Jannett Celestine RDCS (AE) Referring Phys: 82 RACHAL A DAVID  Sonographer Comments: No parasternal window and suboptimal apical window. challenging windows IMPRESSIONS  1. Left ventricular ejection fraction, by visual estimation, is 65 to 70%. The left ventricle has hyperdynamic function. There is no left ventricular hypertrophy.  2. Left ventricular diastolic parameters are consistent with Grade I diastolic dysfunction (impaired relaxation).  3. The left ventricle has no regional wall motion abnormalities.  4. Global right ventricle has normal systolic function.The right ventricular size is normal. No increase in right ventricular wall thickness.  5. Left atrial size was normal.  6. Right atrial size was normal.  7. The mitral valve is normal in structure. No evidence of mitral valve regurgitation. No evidence of mitral stenosis.  8. The tricuspid valve is normal in structure.  9. The tricuspid valve is normal in structure. Tricuspid valve regurgitation is trivial. 10. The aortic valve is normal in structure. Aortic valve regurgitation is not visualized. No evidence of aortic valve sclerosis or stenosis. 11. The pulmonic valve was normal in structure. Pulmonic valve regurgitation is not visualized. 12. The inferior vena cava is normal in size with greater than 50% respiratory variability, suggesting right atrial pressure of 3 mmHg. FINDINGS  Left Ventricle: Left ventricular ejection fraction, by visual estimation, is 65  to 70%. The left ventricle has hyperdynamic function. The left ventricle has no regional wall motion abnormalities. There is no left ventricular hypertrophy. Left ventricular diastolic parameters are consistent with Grade I diastolic dysfunction (impaired relaxation). Normal left atrial pressure. Right Ventricle: The right ventricular size is normal. No increase in right ventricular wall thickness. Global RV systolic function is has normal systolic function. Left Atrium: Left atrial size was normal in size. Right Atrium: Right atrial size was normal in size Pericardium: There is no evidence of pericardial effusion. Mitral Valve: The mitral valve is normal in structure. No evidence of mitral valve regurgitation. No evidence of mitral valve stenosis by observation. Tricuspid Valve: The tricuspid valve is normal in structure. Tricuspid valve regurgitation is trivial. Aortic Valve: The aortic valve is normal in structure. Aortic valve regurgitation is not visualized. The aortic valve is structurally normal, with no evidence of sclerosis or stenosis. Pulmonic Valve: The  pulmonic valve was normal in structure. Pulmonic valve regurgitation is not visualized. Pulmonic regurgitation is not visualized. Aorta: The aortic root, ascending aorta and aortic arch are all structurally normal, with no evidence of dilitation or obstruction. Venous: The inferior vena cava is normal in size with greater than 50% respiratory variability, suggesting right atrial pressure of 3 mmHg. IAS/Shunts: No atrial level shunt detected by color flow Doppler. There is no evidence of a patent foramen ovale. No ventricular septal defect is seen or detected. There is no evidence of an atrial septal defect.   Diastology LV e' lateral:   6.85 cm/s LV E/e' lateral: 8.8 LV e' medial:    6.53 cm/s LV E/e' medial:  9.2  RIGHT VENTRICLE RV S prime:     14.90 cm/s TAPSE (M-mode): 2.1 cm RIGHT ATRIUM           Index RA Area:     18.60 cm RA Volume:   55.00 ml   27.46 ml/m  AORTIC VALVE LVOT Vmax:   80.90 cm/s LVOT Vmean:  61.700 cm/s LVOT VTI:    0.125 m MITRAL VALVE MV Area (PHT): 3.60 cm             SHUNTS MV PHT:        61.19 msec           Systemic VTI: 0.12 m MV Decel Time: 211 msec MV E velocity: 60.40 cm/s 103 cm/s MV A velocity: 74.60 cm/s 70.3 cm/s MV E/A ratio:  0.81       1.5  Candee Furbish MD Electronically signed by Candee Furbish MD Signature Date/Time: 12/17/2019/1:01:57 PM    Final     Pending Labs Unresulted Labs (From admission, onward)   None      Vitals/Pain Today's Vitals   12/17/19 0730 12/17/19 1040 12/17/19 1100 12/17/19 1200  BP: (!) 104/55 116/63 126/67 (!) 108/50  Pulse: 62 74 80 74  Resp:  18 (!) 22 20  Temp:  98.4 F (36.9 C)    TempSrc:  Oral    SpO2: 100% 100% 100% 100%  Height:      PainSc:  0-No pain      Isolation Precautions No active isolations  Medications Medications  0.9 %  sodium chloride infusion ( Intravenous Rate/Dose Change 12/17/19 1044)  aspirin EC tablet 81 mg (81 mg Oral Given 12/17/19 1046)  pantoprazole (PROTONIX) EC tablet 40 mg (40 mg Oral Given 12/17/19 1045)  simvastatin (ZOCOR) tablet 20 mg (has no administration in time range)  heparin injection 5,000 Units (5,000 Units Subcutaneous Given 12/17/19 1045)  polyethylene glycol (MIRALAX / GLYCOLAX) packet 17 g (17 g Oral Given 12/17/19 1046)  senna (SENOKOT) tablet 8.6 mg (8.6 mg Oral Given 12/17/19 1045)  sodium chloride 0.9 % bolus 1,000 mL (0 mLs Intravenous Stopped 12/16/19 2258)    Mobility walks Low fall risk   Focused Assessments   R Recommendations: See Admitting Provider Note  Report given to:   Additional Notes:

## 2019-12-17 NOTE — Evaluation (Signed)
Physical Therapy Evaluation Patient Details Name: Alvin Ingram MRN: [id number] DOB: 04-25-1936 Today's Date: 12/17/2019   History of Present Illness  Alvin Ingram is a 84 y.o. male with medical history significant of GERD, hypertension, hyperlipidemia comes in after having a syncopal episode waiting in the line for an hour and a half to get his Covid vaccine.  Clinical Impression  Pt admitted with above diagnosis.  Pt currently with functional limitations due to the deficits listed below (see PT Problem List). Pt will benefit from skilled PT to increase their independence and safety with mobility to allow discharge to the venue listed below.  Pt moving well with bed mobility and sit > stand.  He is limited by orthostatic hypotension and therefore did not ambulate. Anticipate that once BP is stable he will be ok to d/c home at prior level.  Orthostatics: Supine 113/59 HR 79 Sitting 94/58 HR 82 Standing 73/44 HR 89 Standing after 3 mins 71/47 HR 98 Pt sat and then wanted to use urinal. After using BP 58/35 HR 83 When left, supine with HOB elevated 98/57 HR 83 Pt was asymptomatic with all mobility with no c/o dizziness or feeling faint.     Follow Up Recommendations No PT follow up    Equipment Recommendations  Other (comment)(to be determined)    Recommendations for Other Services       Precautions / Restrictions Precautions Precautions: Fall;Other (comment) Precaution Comments: orthostatic      Mobility  Bed Mobility Overal bed mobility: Modified Independent             General bed mobility comments: came to EOB with bed flat without A  Transfers Overall transfer level: Needs assistance   Transfers: Sit to/from Stand Sit to Stand: Supervision         General transfer comment: S for safety and lines  Ambulation/Gait             General Gait Details: deferred secondary to hypotension  Stairs            Wheelchair Mobility    Modified Rankin  (Stroke Patients Only)       Balance Overall balance assessment: History of Falls;No apparent balance deficits (not formally assessed)(due to hypotension)                                           Pertinent Vitals/Pain Pain Assessment: Faces Faces Pain Scale: Hurts a little bit Pain Location: bottom from laying in the same position Pain Descriptors / Indicators: Sore Pain Intervention(s): Repositioned    Home Living Family/patient expects to be discharged to:: Private residence Living Arrangements: Alone   Type of Home: House Home Access: Stairs to enter Entrance Stairs-Rails: Can reach both Entrance Stairs-Number of Steps: 2 Home Layout: One level Home Equipment: None      Prior Function Level of Independence: Independent               Hand Dominance        Extremity/Trunk Assessment   Upper Extremity Assessment Upper Extremity Assessment: Overall WFL for tasks assessed    Lower Extremity Assessment Lower Extremity Assessment: Overall WFL for tasks assessed       Communication   Communication: No difficulties  Cognition Arousal/Alertness: Awake/alert Behavior During Therapy: WFL for tasks assessed/performed Overall Cognitive Status: Within Functional Limits for tasks assessed  General Comments General comments (skin integrity, edema, etc.): Pt able to stand without UE support. Able to use urinal in standing.    Exercises     Assessment/Plan    PT Assessment Patient needs continued PT services  PT Problem List Decreased activity tolerance;Cardiopulmonary status limiting activity;Decreased balance;Decreased mobility       PT Treatment Interventions DME instruction;Gait training;Functional mobility training;Therapeutic activities;Stair training;Balance training;Therapeutic exercise    PT Goals (Current goals can be found in the Care Plan section)  Acute Rehab PT  Goals Patient Stated Goal: go home PT Goal Formulation: With patient Time For Goal Achievement: 12/31/19 Potential to Achieve Goals: Good    Frequency Min 3X/week   Barriers to discharge        Co-evaluation               AM-PAC PT "6 Clicks" Mobility  Outcome Measure Help needed turning from your back to your side while in a flat bed without using bedrails?: None Help needed moving from lying on your back to sitting on the side of a flat bed without using bedrails?: None Help needed moving to and from a bed to a chair (including a wheelchair)?: None Help needed standing up from a chair using your arms (e.g., wheelchair or bedside chair)?: None Help needed to walk in hospital room?: Total(due to hypotension) Help needed climbing 3-5 steps with a railing? : Total(due to hypotension) 6 Click Score: 18    End of Session Equipment Utilized During Treatment: Gait belt Activity Tolerance: Treatment limited secondary to medical complications (Comment)(orthostatic hypotension) Patient left: in bed;with call bell/phone within reach;Other (comment)(with MD) Nurse Communication: Mobility status PT Visit Diagnosis: Difficulty in walking, not elsewhere classified (R26.2);History of falling (Z91.81)    Time: [id number] PT Time Calculation (min) (ACUTE ONLY): 32 min   Charges:   PT Evaluation $PT Eval Low Complexity: 1 Low PT Treatments $Therapeutic Activity: 8-22 mins        Kinlee Garrison L. Tamala Julian, [place] Pager [phone] 12/17/2019   [name] 12/17/2019, 10:07 AM

## 2019-12-18 LAB — PSA: Prostatic Specific Antigen: 6.56 ng/mL — ABNORMAL HIGH (ref 0.00–4.00)

## 2019-12-18 LAB — BASIC METABOLIC PANEL
Anion gap: 6 (ref 5–15)
BUN: 39 mg/dL — ABNORMAL HIGH (ref 8–23)
CO2: 25 mmol/L (ref 22–32)
Calcium: 8.2 mg/dL — ABNORMAL LOW (ref 8.9–10.3)
Chloride: 111 mmol/L (ref 98–111)
Creatinine, Ser: 2.22 mg/dL — ABNORMAL HIGH (ref 0.61–1.24)
GFR calc Af Amer: 31 mL/min — ABNORMAL LOW (ref >60)
GFR calc non Af Amer: 27 mL/min — ABNORMAL LOW (ref >60)
Glucose, Bld: 94 mg/dL (ref 70–99)
Potassium: 4 mmol/L (ref 3.5–5.1)
Sodium: 142 mmol/L (ref 135–145)

## 2019-12-18 MED ORDER — LIP MEDEX EX OINT: TOPICAL_OINTMENT | CUTANEOUS | Status: DC | PRN

## 2019-12-18 NOTE — Progress Notes (Signed)
Patient has been voiding small amounts today. Second bladder scan completed 604 cc. Provider contacted V.O received to straight cath. I&O cath completed 700 cc.

## 2019-12-18 NOTE — TOC Progression Note (Signed)
Transition of Care [facility]) - Progression Note    Patient Details  Name: Alvin Ingram MRN: [id number] Date of Birth: 1936/06/07  [facility]) CM/SW Contact  Purcell Mouton, RN Phone Number: 12/18/2019, 4:01 PM  Clinical Narrative:    TOC will follow for discharge needs.   Expected Discharge Plan: Home/Self Care Barriers to Discharge: No Barriers Identified  Expected Discharge Plan and Services Expected Discharge Plan: Home/Self Care   Discharge Planning Services: CM Consult   Living arrangements for the past 2 months: Single Family Home                                       Social Determinants of Health (SDOH) Interventions    Readmission Risk Interventions No flowsheet data found.

## 2019-12-18 NOTE — Plan of Care (Signed)
  Problem: Education: Goal: Knowledge of condition and prescribed therapy will improve Outcome: Progressing   Problem: Cardiac: Goal: Will achieve and/or maintain adequate cardiac output Outcome: Progressing   Problem: Physical Regulation: Goal: Complications related to the disease process, condition or treatment will be avoided or minimized Outcome: Progressing   

## 2019-12-18 NOTE — Progress Notes (Signed)
PROGRESS NOTE    Alvin Ingram  [id number] DOB: 1936/11/20 DOA: 12/16/2019 PCP: Binnie Rail, MD    Brief Narrative:  84 year old male with PMH of GERD, hypertension, hyperlipidemia, BPH who presented to the ED for a syncopal episode that occurred while he was waiting in queue to get his Covid vaccine.  He was found to have AKI with creatinine of 3.07, orthostatics positive.  Received 2 L IV fluid bolus in the ED and started on maintenance IV fluids.  Admitted for syncope work-up  Assessment & Plan:   Principal Problem:   Syncope Active Problems:   Hyperlipidemia   Essential hypertension   AKI (acute kidney injury) ([place])   Syncope Orthostatic vitals positive in the ED.  EKG QTC 438, low voltage in all leads. Echocardiogram ordered showed no significant valvular disease and preserved LVEF.  Has grade 1 diastolic dysfunction.  COVID-19 PCR negative. Positive orthostatic vitals likely in setting of dehydration due to poor p.o. intake, ?  Medication induced on alpha-blocker Terazosin. On amitriptyline for more than 5 years. ?  Arrhythmia.  On telemetry Continue IV fluids for now.  Terazosin held. Bradycardic on admission which has since resolved.  Metoprolol on hold. Vitamin B12 normal He remains orthostatic positive today but does not get dizzy on standing up.  Acute kidney injury Baseline creatinine ~1.1. Creatinine elevated to 3.07 on admission which has improved with fluids.  Thought to be prerenal in setting of poor p.o. intake. Improving Renal ultrasound showed no hydronephrosis. Bladder scan to ensure he does not have urinary retention given history of BPH S/p 2 L IV fluid bolus in the ED.  Continue IV fluids at 100 cc/hr   Hypertension Normotensive currently with blood pressure dropping to systolic in the 0000000 while standing. Hold losartan, metoprolol  Poor appetite Poor historian.  Former smoker.  Unclear timeline of poor appetite but appears to be several months.   His major complaints appear to be constipation and difficulty urinating. Last colonoscopy on chart appears to be in 2006 with a due in 2016.  Unclear if he had one. No recent PSA on chart. Will need age-appropriate cancer screening as outpatient  BPH On Terazosin at home.  Health as it may be contributing to orthostatic hypotension.  DVT prophylaxis: Heparin SQ Code Status: Full code  Family Communication: Updated patient's niece Marquita per patient's request at [id number] Disposition Plan: Needs inpatient stay for AKI. Tentative plan for discharge home tomorrow if renal function improves.   Consultants:   None  Procedures:  None  Antimicrobials:   None   Subjective: Poor historian. Says he feels better. Worked with physical therapy. Did not get dizzy while walking. Ate well for breakfast.  Objective: Vitals:   12/18/19 0903 12/18/19 0906 12/18/19 0908 12/18/19 0911  BP: 130/66 123/60 (!) 85/55 111/65  Pulse: 80 86 93 (!) 105  Resp: 16     Temp:      TempSrc:      SpO2:      Weight:      Height:        Intake/Output Summary (Last 24 hours) at 12/18/2019 1358 Last data filed at 12/18/2019 0600 Gross per 24 hour  Intake 2694.17 ml  Output --  Net 2694.17 ml   Filed Weights   12/17/19 1412  Weight: 75.3 kg    Examination:  General exam: Appears calm and comfortable thin elderly male Respiratory system: Clear to auscultation. Respiratory effort normal. Cardiovascular system: S1 & S2 heard, RRR. No JVD,  murmurs. No pedal edema. Gastrointestinal system: Abdomen is nondistended, soft and nontender. No organomegaly or masses felt. Normal bowel sounds heard. Central nervous system: Alert and oriented. No focal neurological deficits. Extremities: Symmetric 5 x 5 power. Skin: No rashes, lesions or ulcers Psychiatry: Judgement and insight appear normal. Mood & affect appropriate.    Data Reviewed: I have personally reviewed following labs and imaging  studies  CBC: Recent Labs  Lab 12/16/19 1912 12/17/19 0330  WBC 5.9 6.1  NEUTROABS 4.4  --   HGB 10.7* 10.1*  HCT 32.7* 30.0*  MCV 96.2 94.9  PLT 122* [id number]*   Basic Metabolic Panel: Recent Labs  Lab 12/16/19 1912 12/17/19 0330 12/18/19 0839  NA 139 138 142  K 4.8 4.3 4.0  CL 105 106 111  CO2 26 24 25   GLUCOSE 98 84 94  BUN 48* 49* 39*  CREATININE 3.07* 2.77* 2.22*  CALCIUM 8.5* 8.3* 8.2*   GFR: Estimated Creatinine Clearance: 27.8 mL/min (A) (by C-G formula based on SCr of 2.22 mg/dL (H)). Liver Function Tests: Recent Labs  Lab 12/17/19 0330  AST 29  ALT 26  ALKPHOS 86  BILITOT 1.1  PROT 4.5*  ALBUMIN 2.3*   No results for input(s): LIPASE, AMYLASE in the last 168 hours. No results for input(s): AMMONIA in the last 168 hours. Coagulation Profile: No results for input(s): INR, PROTIME in the last 168 hours. Cardiac Enzymes: No results for input(s): CKTOTAL, CKMB, CKMBINDEX, TROPONINI in the last 168 hours. BNP (last 3 results) No results for input(s): PROBNP in the last 8760 hours. HbA1C: No results for input(s): HGBA1C in the last 72 hours. CBG: No results for input(s): GLUCAP in the last 168 hours. Lipid Profile: No results for input(s): CHOL, HDL, LDLCALC, TRIG, CHOLHDL, LDLDIRECT in the last 72 hours. Thyroid Function Tests: No results for input(s): TSH, T4TOTAL, FREET4, T3FREE, THYROIDAB in the last 72 hours. Anemia Panel: Recent Labs    12/17/19 1656  VITAMINB12 1,753*   Sepsis Labs: No results for input(s): PROCALCITON, LATICACIDVEN in the last 168 hours.  Recent Results (from the past 240 hour(s))  SARS CORONAVIRUS 2 (TAT 6-24 HRS) Nasopharyngeal Nasopharyngeal Swab     Status: None   Collection Time: 12/16/19  8:54 PM   Specimen: Nasopharyngeal Swab  Result Value Ref Range Status   SARS Coronavirus 2 NEGATIVE NEGATIVE Final    Comment: (NOTE) SARS-CoV-2 target nucleic acids are NOT DETECTED. The SARS-CoV-2 RNA is generally detectable  in upper and lower respiratory specimens during the acute phase of infection. Negative results do not preclude SARS-CoV-2 infection, do not rule out co-infections with other pathogens, and should not be used as the sole basis for treatment or other patient management decisions. Negative results must be combined with clinical observations, patient history, and epidemiological information. The expected result is Negative. Fact Sheet for Patients: SugarRoll.be Fact Sheet for Healthcare Providers: https://www.woods-mathews.com/ This test is not yet approved or cleared by the [place] FDA and  has been authorized for detection and/or diagnosis of SARS-CoV-2 by FDA under an Emergency Use Authorization (EUA). This EUA will remain  in effect (meaning this test can be used) for the duration of the COVID-19 declaration under Section 56 4(b)(1) of the Act, 21 U.S.C. section 360bbb-3(b)(1), unless the authorization is terminated or revoked sooner. Performed at [facility], [place] [address], [place], [place] [address]          Radiology Studies: DG Chest 2 View  Result Date: 12/16/2019 CLINICAL DATA:  Syncopal episode. EXAM: CHEST - 2 VIEW COMPARISON:  December 20, 2012 FINDINGS: The lungs are mildly hyperinflated. There is no evidence of acute infiltrate, pleural effusion or pneumothorax. The heart size and mediastinal contours are within normal limits. The visualized skeletal structures are unremarkable. IMPRESSION: No active cardiopulmonary disease. Electronically Signed   By: Virgina Norfolk M.D.   On: 12/16/2019 19:03   US RENAL  Result Date: 12/17/2019 CLINICAL DATA:  Acute kidney injury. EXAM: RENAL / URINARY TRACT ULTRASOUND COMPLETE COMPARISON:  05/02/2007 abdominal ultrasound. FINDINGS: Right Kidney: Renal measurements: 10.4 x 4.2 x 4.7 cm = volume: 106 mL. Mildly increased renal echogenicity. Normal cortical thickness. Renal cysts,  including at maximally 3.0 x 2.0 x 2.9 cm. Left Kidney: Renal measurements: 11.2 x 6.3 x 5.7 cm = volume: 211 mL. Mildly increased renal echogenicity. Normal cortical thickness. Interpolar left renal cyst of 2.9 cm. Bladder: Within normal limits. Incidental note is made of moderate prostatomegaly. Other: None. IMPRESSION: 1.  No hydronephrosis. 2. Mildly increased renal echogenicity, suggesting medical renal disease. Electronically Signed   By: Abigail Miyamoto M.D.   On: 12/17/2019 11:02   ECHOCARDIOGRAM COMPLETE  Result Date: 12/17/2019   ECHOCARDIOGRAM REPORT   Patient Name:   [facility] Para Date of Exam: 12/17/2019 Medical Rec #:  [id number]    Height:       73.5 in Accession #:    [id number]   Weight:       167.0 lb Date of Birth:  12-13-1935     BSA:          2.00 m Patient Age:    64 years     BP:           144/69 mmHg Patient Gender: M            HR:           85 bpm. Exam Location:  Inpatient Procedure: 2D Echo Indications:    abnormal ECG  History:        Patient has no prior history of Echocardiogram examinations.                 Risk Factors:Hypertension and Diabetes.  Sonographer:    Jannett Celestine RDCS (AE) Referring Phys: 16 RACHAL A DAVID  Sonographer Comments: No parasternal window and suboptimal apical window. challenging windows IMPRESSIONS  1. Left ventricular ejection fraction, by visual estimation, is 65 to 70%. The left ventricle has hyperdynamic function. There is no left ventricular hypertrophy.  2. Left ventricular diastolic parameters are consistent with Grade I diastolic dysfunction (impaired relaxation).  3. The left ventricle has no regional wall motion abnormalities.  4. Global right ventricle has normal systolic function.The right ventricular size is normal. No increase in right ventricular wall thickness.  5. Left atrial size was normal.  6. Right atrial size was normal.  7. The mitral valve is normal in structure. No evidence of mitral valve regurgitation. No evidence of mitral  stenosis.  8. The tricuspid valve is normal in structure.  9. The tricuspid valve is normal in structure. Tricuspid valve regurgitation is trivial. 10. The aortic valve is normal in structure. Aortic valve regurgitation is not visualized. No evidence of aortic valve sclerosis or stenosis. 11. The pulmonic valve was normal in structure. Pulmonic valve regurgitation is not visualized. 12. The inferior vena cava is normal in size with greater than 50% respiratory variability, suggesting right atrial pressure of 3 mmHg. FINDINGS  Left Ventricle: Left ventricular ejection fraction, by visual estimation,  is 65 to 70%. The left ventricle has hyperdynamic function. The left ventricle has no regional wall motion abnormalities. There is no left ventricular hypertrophy. Left ventricular diastolic parameters are consistent with Grade I diastolic dysfunction (impaired relaxation). Normal left atrial pressure. Right Ventricle: The right ventricular size is normal. No increase in right ventricular wall thickness. Global RV systolic function is has normal systolic function. Left Atrium: Left atrial size was normal in size. Right Atrium: Right atrial size was normal in size Pericardium: There is no evidence of pericardial effusion. Mitral Valve: The mitral valve is normal in structure. No evidence of mitral valve regurgitation. No evidence of mitral valve stenosis by observation. Tricuspid Valve: The tricuspid valve is normal in structure. Tricuspid valve regurgitation is trivial. Aortic Valve: The aortic valve is normal in structure. Aortic valve regurgitation is not visualized. The aortic valve is structurally normal, with no evidence of sclerosis or stenosis. Pulmonic Valve: The pulmonic valve was normal in structure. Pulmonic valve regurgitation is not visualized. Pulmonic regurgitation is not visualized. Aorta: The aortic root, ascending aorta and aortic arch are all structurally normal, with no evidence of dilitation or  obstruction. Venous: The inferior vena cava is normal in size with greater than 50% respiratory variability, suggesting right atrial pressure of 3 mmHg. IAS/Shunts: No atrial level shunt detected by color flow Doppler. There is no evidence of a patent foramen ovale. No ventricular septal defect is seen or detected. There is no evidence of an atrial septal defect.   Diastology LV e' lateral:   6.85 cm/s LV E/e' lateral: 8.8 LV e' medial:    6.53 cm/s LV E/e' medial:  9.2  RIGHT VENTRICLE RV S prime:     14.90 cm/s TAPSE (M-mode): 2.1 cm RIGHT ATRIUM           Index RA Area:     18.60 cm RA Volume:   55.00 ml  27.46 ml/m  AORTIC VALVE LVOT Vmax:   80.90 cm/s LVOT Vmean:  61.700 cm/s LVOT VTI:    0.125 m MITRAL VALVE MV Area (PHT): 3.60 cm             SHUNTS MV PHT:        61.19 msec           Systemic VTI: 0.12 m MV Decel Time: 211 msec MV E velocity: 60.40 cm/s 103 cm/s MV A velocity: 74.60 cm/s 70.3 cm/s MV E/A ratio:  0.81       1.5  Candee Furbish MD Electronically signed by Candee Furbish MD Signature Date/Time: 12/17/2019/1:01:57 PM    Final      Scheduled Meds: . aspirin EC  81 mg Oral Daily  . heparin injection (subcutaneous)  5,000 Units Subcutaneous Q8H  . pantoprazole  40 mg Oral Daily  . polyethylene glycol  17 g Oral Daily  . senna  1 tablet Oral Daily  . simvastatin  20 mg Oral QHS   Continuous Infusions: . sodium chloride 100 mL/hr at 12/18/19 0600     LOS: 2 days    Time spent: Spent more than 30 minutes in coordinating care for this patient including bedside patient care.   Lucky Cowboy, MD [facility] If 7PM-7AM, please contact night-coverage 12/18/2019, 1:58 PM

## 2019-12-18 NOTE — Progress Notes (Signed)
Physical Therapy Treatment Patient Details Name: Alvin Ingram MRN: [id number] DOB: Apr 12, 1936 Today's Date: 12/18/2019    History of Present Illness Alvin Ingram is a 84 y.o. male with medical history significant of GERD, hypertension, hyperlipidemia comes in after having a syncopal episode waiting in the line for an hour and a half to get his Covid vaccine.    PT Comments    Pt able to ambulate entire lap of unit with IV pole 50% of the time and no UE support the other 50% with MIN/guard.  Will continue to follow acutely, but do not feel PT is needed after d/c.   Follow Up Recommendations  No PT follow up     Equipment Recommendations  None recommended by PT    Recommendations for Other Services       Precautions / Restrictions Precautions Precautions: Fall;Other (comment) Precaution Comments: orthostatic    Mobility  Bed Mobility Overal bed mobility: Modified Independent                Transfers Overall transfer level: Modified independent   Transfers: Sit to/from Stand Sit to Stand: Modified independent (Device/Increase time)         General transfer comment: no A needed  Ambulation/Gait Ambulation/Gait assistance: Min guard Gait Distance (Feet): 350 Feet Assistive device: IV Pole;None Gait Pattern/deviations: Step-through pattern Gait velocity: WFL   General Gait Details: used IV pole for half of gait and no A for other half. Pt did c/o callous on his foot that makes his feet hurt at times. He said he is going to see a doctor about it.  Pt with no c/o dizziness or lightheadedness.   Stairs             Wheelchair Mobility    Modified Rankin (Stroke Patients Only)       Balance                                            Cognition Arousal/Alertness: Awake/alert Behavior During Therapy: WFL for tasks assessed/performed Overall Cognitive Status: Within Functional Limits for tasks assessed                                         Exercises      General Comments        Pertinent Vitals/Pain Pain Assessment: Faces Faces Pain Scale: Hurts a little bit Pain Location: feet at end of gait Pain Descriptors / Indicators: Sore Pain Intervention(s): Limited activity within patient's tolerance    Home Living                      Prior Function            PT Goals (current goals can now be found in the care plan section) Acute Rehab PT Goals Potential to Achieve Goals: Good Progress towards PT goals: Progressing toward goals    Frequency    Min 3X/week      PT Plan Current plan remains appropriate    Co-evaluation              AM-PAC PT "6 Clicks" Mobility   Outcome Measure  Help needed turning from your back to your side while in a flat bed without using bedrails?: None Help needed moving from  lying on your back to sitting on the side of a flat bed without using bedrails?: None Help needed moving to and from a bed to a chair (including a wheelchair)?: None Help needed standing up from a chair using your arms (e.g., wheelchair or bedside chair)?: None Help needed to walk in hospital room?: A Little Help needed climbing 3-5 steps with a railing? : A Little 6 Click Score: 22    End of Session Equipment Utilized During Treatment: Gait belt Activity Tolerance: Patient tolerated treatment well Patient left: in chair;with call bell/phone within reach;with chair alarm set Nurse Communication: Mobility status PT Visit Diagnosis: Difficulty in walking, not elsewhere classified (R26.2);History of falling (Z91.81)     Time: [id number] PT Time Calculation (min) (ACUTE ONLY): 21 min  Charges:  $Gait Training: 8-22 mins                     [name] L. Tamala Julian, [place] Pager [phone] 12/18/2019    [name] 12/18/2019, 12:33 PM

## 2019-12-19 LAB — BASIC METABOLIC PANEL
Anion gap: 6 (ref 5–15)
BUN: 33 mg/dL — ABNORMAL HIGH (ref 8–23)
CO2: 23 mmol/L (ref 22–32)
Calcium: 8.1 mg/dL — ABNORMAL LOW (ref 8.9–10.3)
Chloride: 111 mmol/L (ref 98–111)
Creatinine, Ser: 1.88 mg/dL — ABNORMAL HIGH (ref 0.61–1.24)
GFR calc Af Amer: 38 mL/min — ABNORMAL LOW (ref >60)
GFR calc non Af Amer: 33 mL/min — ABNORMAL LOW (ref >60)
Glucose, Bld: 84 mg/dL (ref 70–99)
Potassium: 4.7 mmol/L (ref 3.5–5.1)
Sodium: 140 mmol/L (ref 135–145)

## 2019-12-19 MED ORDER — POLYETHYLENE GLYCOL 3350 17 G PO PACK: 17.0000 g | PACK | Freq: Every day | ORAL | 0 refills | Status: AC

## 2019-12-19 MED ORDER — SENNA 8.6 MG PO TABS: 1.0000 | ORAL_TABLET | Freq: Every day | ORAL | 0 refills | Status: AC

## 2019-12-19 MED ORDER — MAGNESIUM CITRATE PO SOLN
1.0000 | Freq: Once | ORAL | Status: AC
  Administered 2019-12-19: 1 via ORAL
  Filled 2019-12-19: qty 296

## 2019-12-19 NOTE — Progress Notes (Addendum)
Bladder scan and In and Out cath recently completed 600 cc out. Will have to allow time to place coude catheter. Hospitalist provider updated.

## 2019-12-19 NOTE — Progress Notes (Addendum)
No change from am assessment. Pt A&Ox4,out of bed self. Pt now has foley catheter in place. Discharge instructions reviewed. Questions concerns  Denied at this time. Pt encouraged to call for follow ups with Primary and Urologist provider. Foley education provided as well as supplies.

## 2019-12-19 NOTE — Progress Notes (Signed)
Physical Therapy Treatment Patient Details Name: Alvin Ingram MRN: [id number] DOB: 11/12/1936 Today's Date: 12/19/2019    History of Present Illness Alvin Ingram is a 84 y.o. male with medical history significant of GERD, hypertension, hyperlipidemia comes in after having a syncopal episode waiting in the line for an hour and a half to get his Covid vaccine.    PT Comments    Assisted pt OOB.  General transfer comment: good safety cognition and use of hands to steady self.  No AD needed.  Good static standing balnce to use urinal and wash hands at sink.  Pt stated, "Edwyna Perfect they told me not to stand still with my knees locked".  Pt self corrected.General Gait Details: Not holding to IV pole this time, pt amb a great distance with good alternating gait and no LOB.Stairs: (pt feels stairs "will not be a problem") Pt plans to return home and resume prior Indep.  Follow Up Recommendations  No PT follow up     Equipment Recommendations  None recommended by PT    Recommendations for Other Services       Precautions / Restrictions Precautions Precautions: Fall Precaution Comments: orthostatic    Mobility  Bed Mobility Overal bed mobility: Modified Independent             General bed mobility comments: self able  Transfers Overall transfer level: Modified independent   Transfers: Sit to/from [employer] transfer comment: good safety cognition and use of hands to steady self.  No AD needed.  Good static standing balnce to use urinal and wash hands at sink.  Pt stated, "Edwyna Perfect they told me not to stand still with my knees locked".  Pt self corrected.  Ambulation/Gait Ambulation/Gait assistance: Supervision Gait Distance (Feet): 450 Feet Assistive device: None Gait Pattern/deviations: Step-through pattern     General Gait Details: Not holding to IV pole this time, pt amb a great distance with good alternating gait and no  LOB.   Stairs Stairs: (pt feels stairs "will not be a problem")           Wheelchair Mobility    Modified Rankin (Stroke Patients Only)       Balance                                            Cognition Arousal/Alertness: Awake/alert Behavior During Therapy: WFL for tasks assessed/performed Overall Cognitive Status: Within Functional Limits for tasks assessed                                 General Comments: AxO x 4 motivated      Exercises      General Comments        Pertinent Vitals/Pain      Home Living                      Prior Function            PT Goals (current goals can now be found in the care plan section) Progress towards PT goals: Progressing toward goals    Frequency    Min 3X/week      PT Plan Current plan remains appropriate    Co-evaluation  AM-PAC PT "6 Clicks" Mobility   Outcome Measure  Help needed turning from your back to your side while in a flat bed without using bedrails?: None Help needed moving from lying on your back to sitting on the side of a flat bed without using bedrails?: None Help needed moving to and from a bed to a chair (including a wheelchair)?: None Help needed standing up from a chair using your arms (e.g., wheelchair or bedside chair)?: None Help needed to walk in hospital room?: None Help needed climbing 3-5 steps with a railing? : A Little 6 Click Score: 23    End of Session Equipment Utilized During Treatment: Gait belt Activity Tolerance: Patient tolerated treatment well Patient left: in bed;with call bell/phone within reach;with bed alarm set Nurse Communication: Mobility status PT Visit Diagnosis: Difficulty in walking, not elsewhere classified (R26.2);History of falling (Z91.81)     Time: [id number] PT Time Calculation (min) (ACUTE ONLY): 28 min  Charges:  $Gait Training: 8-22 mins $Therapeutic Activity: 8-22 mins                      Rica Koyanagi  PTA Acute  Rehabilitation Services Pager      [phone] Office      [phone]

## 2019-12-19 NOTE — Discharge Instructions (Signed)
Indwelling Urinary Catheter Care, Adult An indwelling urinary catheter is a thin tube that is put into your bladder. The tube helps to drain pee (urine) out of your body. The tube goes in through your urethra. Your urethra is where pee comes out of your body. Your pee will come out through the catheter, then it will go into a bag (drainage bag). Take good care of your catheter so it will work well. How to wear your catheter and bag Supplies needed  Sticky tape (adhesive tape) or a leg strap.  Alcohol wipe or soap and water (if you use tape).  A clean towel (if you use tape).  Large overnight bag.  Smaller bag (leg bag). Wearing your catheter Attach your catheter to your leg with tape or a leg strap.  Make sure the catheter is not pulled tight.  If a leg strap gets wet, take it off and put on a dry strap.  If you use tape to hold the bag on your leg: 1. Use an alcohol wipe or soap and water to wash your skin where the tape made it sticky before. 2. Use a clean towel to pat-dry that skin. 3. Use new tape to make the bag stay on your leg. Wearing your bags You should have been given a large overnight bag.  You may wear the overnight bag in the day or night.  Always have the overnight bag lower than your bladder.  Do not let the bag touch the floor.  Before you go to sleep, put a clean plastic bag in a wastebasket. Then hang the overnight bag inside the wastebasket. You should also have a smaller leg bag that fits under your clothes.  Always wear the leg bag below your knee.  Do not wear your leg bag at night. How to care for your skin and catheter Supplies needed  A clean washcloth.  Water and mild soap.  A clean towel. Caring for your skin and catheter      Clean the skin around your catheter every day: 1. Wash your hands with soap and water. 2. Wet a clean washcloth in warm water and mild soap. 3. Clean the skin around your urethra.  If you are  male:  Gently spread the folds of skin around your vagina (labia).  With the washcloth in your other hand, wipe the inner side of your labia on each side. Wipe from front to back.  If you are male:  Pull back any skin that covers the end of your penis (foreskin).  With the washcloth in your other hand, wipe your penis in small circles. Start wiping at the tip of your penis, then move away from the catheter.  Move the foreskin back in place, if needed. 4. With your free hand, hold the catheter close to where it goes into your body.  Keep holding the catheter during cleaning so it does not get pulled out. 5. With the washcloth in your other hand, clean the catheter.  Only wipe downward on the catheter.  Do not wipe upward toward your body. Doing this may push germs into your urethra and cause infection. 6. Use a clean towel to pat-dry the catheter and the skin around it. Make sure to wipe off all soap. 7. Wash your hands with soap and water.  Shower every day. Do not take baths.  Do not use cream, ointment, or lotion on the area where the catheter goes into your body, unless your doctor tells you   to.  Do not use powders, sprays, or lotions on your genital area.  Check your skin around the catheter every day for signs of infection. Check for: ? Redness, swelling, or pain. ? Fluid or blood. ? Warmth. ? Pus or a bad smell. How to empty the bag Supplies needed  Rubbing alcohol.  Gauze pad or cotton ball.  Tape or a leg strap. Emptying the bag Pour the pee out of your bag when it is ?- full, or at least 2-3 times a day. Do this for your overnight bag and your leg bag. 1. Wash your hands with soap and water. 2. Separate (detach) the bag from your leg. 3. Hold the bag over the toilet or a clean pail. Keep the bag lower than your hips and bladder. This is so the pee (urine) does not go back into the tube. 4. Open the pour spout. It is at the bottom of the bag. 5. Empty the  pee into the toilet or pail. Do not let the pour spout touch any surface. 6. Put rubbing alcohol on a gauze pad or cotton ball. 7. Use the gauze pad or cotton ball to clean the pour spout. 8. Close the pour spout. 9. Attach the bag to your leg with tape or a leg strap. 10. Wash your hands with soap and water. Follow instructions for cleaning the drainage bag:  From the product maker.  As told by your doctor. How to change the bag Supplies needed  Alcohol wipes.  A clean bag.  Tape or a leg strap. Changing the bag Replace your bag when it starts to leak, smell bad, or look dirty. 1. Wash your hands with soap and water. 2. Separate the dirty bag from your leg. 3. Pinch the catheter with your fingers so that pee does not spill out. 4. Separate the catheter tube from the bag tube where these tubes connect (at the connection valve). Do not let the tubes touch any surface. 5. Clean the end of the catheter tube with an alcohol wipe. Use a different alcohol wipe to clean the end of the bag tube. 6. Connect the catheter tube to the tube of the clean bag. 7. Attach the clean bag to your leg with tape or a leg strap. Do not make the bag tight on your leg. 8. Wash your hands with soap and water. General rules   Never pull on your catheter. Never try to take it out. Doing that can hurt you.  Always wash your hands before and after you touch your catheter or bag. Use a mild, fragrance-free soap. If you do not have soap and water, use hand sanitizer.  Always make sure there are no twists or bends (kinks) in the catheter tube.  Always make sure there are no leaks in the catheter or bag.  Drink enough fluid to keep your pee pale yellow.  Do not take baths, swim, or use a hot tub.  If you are male, wipe from front to back after you poop (have a bowel movement). Contact a doctor if:  Your pee is cloudy.  Your pee smells worse than usual.  Your catheter gets clogged.  Your catheter  leaks.  Your bladder feels full. Get help right away if:  You have redness, swelling, or pain where the catheter goes into your body.  You have fluid, blood, pus, or a bad smell coming from the area where the catheter goes into your body.  Your skin feels warm where   the catheter goes into your body.  You have a fever.  You have pain in your: ? Belly (abdomen). ? Legs. ? Lower back. ? Bladder.  You see blood in the catheter.  Your pee is pink or red.  You feel sick to your stomach (nauseous).  You throw up (vomit).  You have chills.  Your pee is not draining into the bag.  Your catheter gets pulled out. Summary  An indwelling urinary catheter is a thin tube that is placed into the bladder to help drain pee (urine) out of the body.  The catheter is placed into the part of the body that drains pee from the bladder (urethra).  Taking good care of your catheter will keep it working properly and help prevent problems.  Always wash your hands before and after touching your catheter or bag.  Never pull on your catheter or try to take it out. This information is not intended to replace advice given to you by your health care provider. Make sure you discuss any questions you have with your health care provider. Document Revised: 03/07/2019 Document Reviewed: 06/29/2017 [employer] Patient Education  [place]. Indwelling Urinary Catheter Insertion, Care After This sheet gives you information about how to care for yourself after your procedure. Your health care provider may also give you more specific instructions. If you have problems or questions, contact your health care provider. What can I expect after the procedure? After the procedure, it is common to have:  Slight discomfort around your urethra where the catheter enters your body. Follow these instructions at home:   Keep the drainage bag at or below the level of your bladder. Doing this ensures that urine  can only drain out, not back into your body.  Secure the catheter tubing and drainage bag to your leg or thigh to keep it from moving.  Check the catheter tubing regularly to make sure there are no kinks or blockages.  Take showers daily to keep the catheter clean. Do not take a bath.  Do not pull on your catheter or try to remove it.  Disconnect the tubing and drainage bag as little as possible.  Empty the drainage bag every 2-4 hours, or more often if needed. Do not let the bag get completely full.  Wash your hands with soap and water before and after touching the catheter, tubing, or drainage bag.  Do not let the drainage bag or catheter tubing touch the floor.  Drink enough fluids to keep your urine clear or pale yellow, or as told by your health care provider. Contact a health care provider if:  Urine stops flowing into the drainage bag.  You feel pain or pressure in the bladder area.  You have back pain.  Your catheter gets clogged.  Your catheter starts to leak.  Your urine looks cloudy.  Your drainage bag or tubing looks dirty.  You notice a bad smell when emptying your drainage bag. Get help right away if:  You have a fever or chills.  You have severe pain in your back or your lower abdomen.  You have warmth, redness, swelling, or pain in the urethra area.  You notice blood in your urine.  Your catheter gets pulled out. Summary  Do not pull on your catheter or try to remove it.  Keep the drainage bag at or below the level of your bladder, but do not let the drainage bag or catheter tubing touch the floor.  Wash your hands  with soap and water before and after touching the catheter, tubing, or drainage bag.  Contact your health care provider if you have a fever, chills, or any other signs of infection. This information is not intended to replace advice given to you by your health care provider. Make sure you discuss any questions you have with your  health care provider. Document Revised: 03/07/2019 Document Reviewed: 12/23/2016 Else[employer] Patient Education  2020 [place] are being discharged with a urinary catheter in place for urinary retention. You should follow up with your urologist within 1 week of discharge.  - Your blood pressure and prostate medications have been discontinued on discharge as it may have contributed to your passing out.  - You must follow up with your primary care doctor for blood pressure and heart rate monitoring. You also should follow up with your gastroenterologist sooner than later.  - Drink plenty of water to stay hydrated.

## 2019-12-19 NOTE — Discharge Summary (Signed)
Physician Discharge Summary  Alvin Ingram [id number] DOB: 1936/01/21 DOA: 12/16/2019  PCP: Alvin Rail, MD  Admit date: 12/16/2019 Discharge date: 12/19/2019  Admitted From: Home Disposition:  home  Recommendations for Outpatient Follow-up:  1. Discharged with Foley catheter given 2 episodes of urinary retention as noted on bladder scan requiring in and out cath.  Advised close urology follow-up. 2. Losartan, metoprolol, Terazosin were discontinued during this hospitalization for positive orthostatics and bradycardia on admission.  Recommend follow-up orthostatic vitals, heart rate to restart as deemed appropriate. 3. He noted weight loss, poor appetite for the last several months. His major complaints with difficulty urinating and constipation. Recommend age-appropriate cancer screening.  Constipation is a major issue, consider referral for colonoscopy which per the patient is due this year.   Home Health: No Equipment/Devices: None  Discharge Condition: Stable CODE STATUS: Full code Diet recommendation: Regular diet  Brief/Interim Summary: 84 year old African-American male with PMH of GERD, hypertension, hyperlipidemia, BPH who presented to the ED for syncopal episode that occurred while he was waiting in queue to get his Covid vaccine.  Orthostatics positive.  AKI with a creatinine of 3.07 on admission which improved with IV fluids.  Blood pressure medications losartan, metoprolol were held.  Terazosin held.  He remained orthostatic positive following hydration and discontinuation of above-mentioned medications, but did not have any dizziness on standing and was able to ambulate without assistance with no issues.  Vitamin B12 normal.  Not noted to have any obvious arrhythmias on telemetry.  EKG with QTC 438.  Echo showed no significant valvular heart disease, preserved LVEF.  Renal ultrasound showed no hydronephrosis.  He was bladder scanned twice and noted to have urinary retention of  about 700 cc requiring in and out caths.  Foley catheter was therefore placed with close follow-up with urology as outpatient.  Discharge Diagnoses:  Principal Problem:   Syncope Active Problems:   Hyperlipidemia   Essential hypertension   AKI (acute kidney injury) [facility])    Discharge Instructions:  Discharge Instructions    Diet - low sodium heart healthy   Complete by: As directed    Increase activity slowly   Complete by: As directed      Allergies as of 12/19/2019   No Known Allergies     Medication List    STOP taking these medications   doxazosin 8 MG tablet Commonly known as: CARDURA   losartan 50 MG tablet Commonly known as: COZAAR   metoprolol succinate 100 MG 24 hr tablet Commonly known as: TOPROL-XL     TAKE these medications   amitriptyline 25 MG tablet Commonly known as: ELAVIL Take 25 mg by mouth at bedtime.   aspirin 81 MG tablet Take 81 mg by mouth daily.   CENTRUM SILVER PO Take 1 tablet by mouth daily.   omeprazole 20 MG capsule Commonly known as: PRILOSEC Take 1 capsule by mouth once daily   polyethylene glycol 17 g packet Commonly known as: MIRALAX / GLYCOLAX Take 17 g by mouth daily. Start taking on: December 20, 2019   senna 8.6 MG Tabs tablet Commonly known as: SENOKOT Take 1 tablet (8.6 mg total) by mouth daily. Start taking on: December 20, 2019   simvastatin 20 MG tablet Commonly known as: ZOCOR TAKE 1 TABLET BY MOUTH AT BEDTIME       No Known Allergies  Consultations:  None   Procedures/Studies: DG Chest 2 View  Result Date: 12/16/2019 CLINICAL DATA:  Syncopal episode. EXAM: CHEST - 2 VIEW  COMPARISON:  December 20, 2012 FINDINGS: The lungs are mildly hyperinflated. There is no evidence of acute infiltrate, pleural effusion or pneumothorax. The heart size and mediastinal contours are within normal limits. The visualized skeletal structures are unremarkable. IMPRESSION: No active cardiopulmonary disease.  Electronically Signed   By: Alvin Ingram M.D.   On: 12/16/2019 19:03   US RENAL  Result Date: 12/17/2019 CLINICAL DATA:  Acute kidney injury. EXAM: RENAL / URINARY TRACT ULTRASOUND COMPLETE COMPARISON:  05/02/2007 abdominal ultrasound. FINDINGS: Right Kidney: Renal measurements: 10.4 x 4.2 x 4.7 cm = volume: 106 mL. Mildly increased renal echogenicity. Normal cortical thickness. Renal cysts, including at maximally 3.0 x 2.0 x 2.9 cm. Left Kidney: Renal measurements: 11.2 x 6.3 x 5.7 cm = volume: 211 mL. Mildly increased renal echogenicity. Normal cortical thickness. Interpolar left renal cyst of 2.9 cm. Bladder: Within normal limits. Incidental note is made of moderate prostatomegaly. Other: None. IMPRESSION: 1.  No hydronephrosis. 2. Mildly increased renal echogenicity, suggesting medical renal disease. Electronically Signed   By: Alvin Ingram M.D.   On: 12/17/2019 11:02   ECHOCARDIOGRAM COMPLETE  Result Date: 12/17/2019   ECHOCARDIOGRAM REPORT   Patient Name:   Alvin Ingram Date of Exam: 12/17/2019 Medical Rec #:  [id number]    Height:       73.5 in Accession #:    [id number]   Weight:       167.0 lb Date of Birth:  September 18, 1936     BSA:          2.00 m Patient Age:    84 years     BP:           144/69 mmHg Patient Gender: M            HR:           85 bpm. Exam Location:  Inpatient Procedure: 2D Echo Indications:    abnormal ECG  History:        Patient has no prior history of Echocardiogram examinations.                 Risk Factors:Hypertension and Diabetes.  Sonographer:    Jannett Celestine RDCS (AE) Referring Phys: 65 Alvin Ingram  Sonographer Comments: No parasternal window and suboptimal apical window. challenging windows IMPRESSIONS  1. Left ventricular ejection fraction, by visual estimation, is 65 to 70%. The left ventricle has hyperdynamic function. There is no left ventricular hypertrophy.  2. Left ventricular diastolic parameters are consistent with Grade I diastolic dysfunction (impaired  relaxation).  3. The left ventricle has no regional wall motion abnormalities.  4. Global right ventricle has normal systolic function.The right ventricular size is normal. No increase in right ventricular wall thickness.  5. Left atrial size was normal.  6. Right atrial size was normal.  7. The mitral valve is normal in structure. No evidence of mitral valve regurgitation. No evidence of mitral stenosis.  8. The tricuspid valve is normal in structure.  9. The tricuspid valve is normal in structure. Tricuspid valve regurgitation is trivial. 10. The aortic valve is normal in structure. Aortic valve regurgitation is not visualized. No evidence of aortic valve sclerosis or stenosis. 11. The pulmonic valve was normal in structure. Pulmonic valve regurgitation is not visualized. 12. The inferior vena cava is normal in size with greater than 50% respiratory variability, suggesting right atrial pressure of 3 mmHg. FINDINGS  Left Ventricle: Left ventricular ejection fraction, by visual estimation, is 65 to 70%. The left ventricle  has hyperdynamic function. The left ventricle has no regional wall motion abnormalities. There is no left ventricular hypertrophy. Left ventricular diastolic parameters are consistent with Grade I diastolic dysfunction (impaired relaxation). Normal left atrial pressure. Right Ventricle: The right ventricular size is normal. No increase in right ventricular wall thickness. Global RV systolic function is has normal systolic function. Left Atrium: Left atrial size was normal in size. Right Atrium: Right atrial size was normal in size Pericardium: There is no evidence of pericardial effusion. Mitral Valve: The mitral valve is normal in structure. No evidence of mitral valve regurgitation. No evidence of mitral valve stenosis by observation. Tricuspid Valve: The tricuspid valve is normal in structure. Tricuspid valve regurgitation is trivial. Aortic Valve: The aortic valve is normal in structure. Aortic  valve regurgitation is not visualized. The aortic valve is structurally normal, with no evidence of sclerosis or stenosis. Pulmonic Valve: The pulmonic valve was normal in structure. Pulmonic valve regurgitation is not visualized. Pulmonic regurgitation is not visualized. Aorta: The aortic root, ascending aorta and aortic arch are all structurally normal, with no evidence of dilitation or obstruction. Venous: The inferior vena cava is normal in size with greater than 50% respiratory variability, suggesting right atrial pressure of 3 mmHg. IAS/Shunts: No atrial level shunt detected by color flow Doppler. There is no evidence of a patent foramen ovale. No ventricular septal defect is seen or detected. There is no evidence of an atrial septal defect.   Diastology LV e' lateral:   6.85 cm/s LV E/e' lateral: 8.8 LV e' medial:    6.53 cm/s LV E/e' medial:  9.2  RIGHT VENTRICLE RV S prime:     14.90 cm/s TAPSE (M-mode): 2.1 cm RIGHT ATRIUM           Index RA Area:     18.60 cm RA Volume:   55.00 ml  27.46 ml/m  AORTIC VALVE LVOT Vmax:   80.90 cm/s LVOT Vmean:  61.700 cm/s LVOT VTI:    0.125 m MITRAL VALVE MV Area (PHT): 3.60 cm             SHUNTS MV PHT:        61.19 msec           Systemic VTI: 0.12 m MV Decel Time: 211 msec MV E velocity: 60.40 cm/s 103 cm/s MV A velocity: 74.60 cm/s 70.3 cm/s MV E/A ratio:  0.81       1.5  Candee Furbish MD Electronically signed by Candee Furbish MD Signature Date/Time: 12/17/2019/1:01:57 PM    Final      Subjective: Reports feeling fine.  Wants to go home.  Worked well with PT.  Discharge Exam: Vitals:   12/18/19 2022 12/19/19 0447  BP: 140/71 (!) 142/76  Pulse: 78 65  Resp: 18 18  Temp: 98.4 F (36.9 C) 98.7 F (37.1 C)  SpO2: 100% 97%   Vitals:   12/18/19 0911 12/18/19 1358 12/18/19 2022 12/19/19 0447  BP: 111/65 127/72 140/71 (!) 142/76  Pulse: (!) 105 68 78 65  Resp:  16 18 18   Temp:  97.6 F (36.4 C) 98.4 F (36.9 C) 98.7 F (37.1 C)  TempSrc:    Oral   SpO2:  100% 100% 97%  Weight:      Height:        General: Pt is alert, awake, not in acute distress Cardiovascular: RRR, S1/S2 +, no rubs, no gallops Respiratory: CTA bilaterally, no wheezing, no rhonchi Abdominal: Soft, NT, ND, bowel sounds +  Extremities: no edema, no cyanosis   The results of significant diagnostics from this hospitalization (including imaging, microbiology, ancillary and laboratory) are listed below for reference.     Microbiology: Recent Results (from the past 240 hour(s))  SARS CORONAVIRUS 2 (TAT 6-24 HRS) Nasopharyngeal Nasopharyngeal Swab     Status: None   Collection Time: 12/16/19  8:54 PM   Specimen: Nasopharyngeal Swab  Result Value Ref Range Status   SARS Coronavirus 2 NEGATIVE NEGATIVE Final    Comment: (NOTE) SARS-CoV-2 target nucleic acids are NOT DETECTED. The SARS-CoV-2 RNA is generally detectable in upper and lower respiratory specimens during the acute phase of infection. Negative results do not preclude SARS-CoV-2 infection, do not rule out co-infections with other pathogens, and should not be used as the sole basis for treatment or other patient management decisions. Negative results must be combined with clinical observations, patient history, and epidemiological information. The expected result is Negative. Fact Sheet for Patients: SugarRoll.be Fact Sheet for Healthcare Providers: https://www.woods-mathews.com/ This test is not yet approved or cleared by the [place] FDA and  has been authorized for detection and/or diagnosis of SARS-CoV-2 by FDA under an Emergency Use Authorization (EUA). This EUA will remain  in effect (meaning this test can be used) for the duration of the COVID-19 declaration under Section 56 4(b)(1) of the Act, 21 U.S.C. section 360bbb-3(b)(1), unless the authorization is terminated or revoked sooner. Performed at [facility], [place] [address],  [place], [place] [address]      Labs: BNP (last 3 results) No results for input(s): BNP in the last 8760 hours. Basic Metabolic Panel: Recent Labs  Lab 12/16/19 1912 12/17/19 0330 12/18/19 0839 12/19/19 0239  NA 139 138 142 140  K 4.8 4.3 4.0 4.7  CL 105 106 111 111  CO2 26 24 25 23   GLUCOSE 98 84 94 84  BUN 48* 49* 39* 33*  CREATININE 3.07* 2.77* 2.22* 1.88*  CALCIUM 8.5* 8.3* 8.2* 8.1*   Liver Function Tests: Recent Labs  Lab 12/17/19 0330  AST 29  ALT 26  ALKPHOS 86  BILITOT 1.1  PROT 4.5*  ALBUMIN 2.3*   No results for input(s): LIPASE, AMYLASE in the last 168 hours. No results for input(s): AMMONIA in the last 168 hours. CBC: Recent Labs  Lab 12/16/19 1912 12/17/19 0330  WBC 5.9 6.1  NEUTROABS 4.4  --   HGB 10.7* 10.1*  HCT 32.7* 30.0*  MCV 96.2 94.9  PLT 122* 110*   Cardiac Enzymes: No results for input(s): CKTOTAL, CKMB, CKMBINDEX, TROPONINI in the last 168 hours. BNP: Invalid input(s): POCBNP CBG: No results for input(s): GLUCAP in the last 168 hours. D-Dimer No results for input(s): DDIMER in the last 72 hours. Hgb A1c No results for input(s): HGBA1C in the last 72 hours. Lipid Profile No results for input(s): CHOL, HDL, LDLCALC, TRIG, CHOLHDL, LDLDIRECT in the last 72 hours. Thyroid function studies No results for input(s): TSH, T4TOTAL, T3FREE, THYROIDAB in the last 72 hours.  Invalid input(s): FREET3 Anemia work up [employer]    12/17/19 1656  VITAMINB12 1,753*   Urinalysis No results found for: COLORURINE, APPEARANCEUR, [place], [place], [place], [place], [place], [place], PROTEINUR, UROBILINOGEN, NITRITE, LEUKOCYTESUR Sepsis Labs Invalid input(s): PROCALCITONIN,  WBC,  LACTICIDVEN Microbiology Recent Results (from the past 240 hour(s))  SARS CORONAVIRUS 2 (TAT 6-24 HRS) Nasopharyngeal Nasopharyngeal Swab     Status: None   Collection Time: 12/16/19  8:54 PM   Specimen: Nasopharyngeal Swab  Result Value Ref  Range Status    SARS Coronavirus 2 NEGATIVE NEGATIVE Final    Comment: (NOTE) SARS-CoV-2 target nucleic acids are NOT DETECTED. The SARS-CoV-2 RNA is generally detectable in upper and lower respiratory specimens during the acute phase of infection. Negative results do not preclude SARS-CoV-2 infection, do not rule out co-infections with other pathogens, and should not be used as the sole basis for treatment or other patient management decisions. Negative results must be combined with clinical observations, patient history, and epidemiological information. The expected result is Negative. Fact Sheet for Patients: SugarRoll.be Fact Sheet for Healthcare Providers: https://www.woods-mathews.com/ This test is not yet approved or cleared by the [place] FDA and  has been authorized for detection and/or diagnosis of SARS-CoV-2 by FDA under an Emergency Use Authorization (EUA). This EUA will remain  in effect (meaning this test can be used) for the duration of the COVID-19 declaration under Section 56 4(b)(1) of the Act, 21 U.S.C. section 360bbb-3(b)(1), unless the authorization is terminated or revoked sooner. Performed at [facility], [place] [address] [place], [place] [address]      Time coordinating discharge: Over 30 minutes  SIGNED:   Lucky Cowboy, MD  [facility] 12/19/2019, 12:53 PM  If 7PM-7AM, please contact night-coverage

## 2019-12-24 DIAGNOSIS — R338 Other retention of urine: Secondary | ICD-10-CM | POA: Diagnosis not present

## 2019-12-26 ENCOUNTER — Encounter: Payer: Self-pay | Admitting: *Deleted

## 2019-12-26 ENCOUNTER — Other Ambulatory Visit: Payer: Self-pay | Admitting: *Deleted

## 2019-12-26 NOTE — Patient Outreach (Signed)
Telephone outreach for RED FLAG on EMMI CALL post hospitalization.  Alvin Ingram consented to [facility] interview. Advised of purpose of call and of [facility] services.  Asked about follow up appt with primary care and he does not have one. He reports however, he did see Dr. Rosana Hoes, urology. His foley catheter was removed and he is voiding freely. He does report urgency. Tamsulosin 0.4 mg was started.  Pt denies dizziness. He reports significant LE edema. He does raise his legs to reduce this.  He lives alone but has a brother and niece close by that can help him if necessary. He is independent for ADLs and IADLs.  Patient was recently discharged from hospital and all medications have been reviewed. Outpatient Encounter Medications as of 12/26/2019  Medication Sig  . amitriptyline (ELAVIL) 25 MG tablet Take 25 mg by mouth at bedtime.  Marland Kitchen aspirin 81 MG tablet Take 81 mg by mouth daily.    . Multiple Vitamins-Minerals (CENTRUM SILVER PO) Take 1 tablet by mouth daily.   Marland Kitchen omeprazole (PRILOSEC) 20 MG capsule Take 1 capsule by mouth once daily (Patient taking differently: Take 20 mg by mouth daily. )  . polyethylene glycol (MIRALAX / GLYCOLAX) 17 g packet Take 17 g by mouth daily.  Marland Kitchen senna (SENOKOT) 8.6 MG TABS tablet Take 1 tablet (8.6 mg total) by mouth daily.  . simvastatin (ZOCOR) 20 MG tablet TAKE 1 TABLET BY MOUTH AT BEDTIME (Patient taking differently: Take 20 mg by mouth at bedtime. )  . tamsulosin (FLOMAX) 0.4 MG CAPS capsule Take 0.4 mg by mouth.   No facility-administered encounter medications on file as of 12/26/2019.   Fall Risk  12/26/2019 08/19/2019 07/15/2018 07/12/2017 06/14/2016  Falls in the past year? 1 1 No No No  Number falls in past yr: 1 0 - - -  Injury with Fall? 1 0 - - -  Risk for fall due to : Medication side effect - - - -  Follow up Falls evaluation completed - - - -   Depression screen [facility] 2/9 12/26/2019 08/19/2019 07/15/2018 07/12/2017 06/14/2016  Decreased Interest 1 0 2 0 0  Down,  Depressed, Hopeless 1 0 2 0 0  PHQ - 2 Score 2 0 4 0 0  Altered sleeping 0 - 1 - -  Tired, decreased energy 1 - 1 - -  Change in appetite 0 - 0 - -  Feeling bad or failure about yourself  1 - 0 - -  Trouble concentrating 0 - 0 - -  Moving slowly or fidgety/restless 0 - 0 - -  Suicidal thoughts 0 - 0 - -  PHQ-9 Score 4 - 6 - -  Difficult doing work/chores Not difficult at all - - - [facility] CM Care Plan Problem One     Most Recent Value  Care Plan Problem One  Knowledge deficit regarding low sodium diet.  Role Documenting the Problem One  Care Management [place] for Problem One  Active  [facility] Long Term Goal   Pt will report less peripheral edema and stable weight within 3 pounds by the end of 60 days.  [facility] Long Term Goal Start Date  12/26/19  Interventions for Problem One Long Term Goal  Discussed need for low salt diet and basics: avoid added salt, avoid canned and processed foods, try to use fresh fruit and vegetables, fresh low fat meats.  [facility] CM Short Term Goal #1   Pt will be able to discuss  and share low sodium diet changes by the end of 30 days.  [facility] CM Short Term Goal #1 Start Date  12/26/19  Interventions for Short Term Goal #1  Sending educational information.  [facility] CM Short Term Goal #2   Pt will purchase scale and start daily wts and recording before the end of 30 days.  [facility] CM Short Term Goal #2 Start Date  12/26/19  Interventions for Short Term Goal #2  Advised to purchase a scale and start weighing daily and writing it down.    [facility] CM Care Plan Problem Two     Most Recent Value  Care Plan Problem Two  No follow up appt with primary care MD.  Role Documenting the Problem Two  Care Management [facility] for Problem Two  Active  [facility] CM Short Term Goal #1   Pt to call primary office and request post hospital evaluation ASAP and schedule within the next 7 days.  Interventions for Short Term Goal #2   Advised seeing primary care MD is shown to reduce  chances of readmission.     I will call again in one week.  Eulah Pont. Myrtie Neither, MSN, [facility] Gerontological Nurse Practitioner [facility] Care Management [phone]

## 2019-12-28 ENCOUNTER — Ambulatory Visit: Payer: Medicare HMO

## 2019-12-28 NOTE — Progress Notes (Signed)
Subjective:    Patient ID: Alvin Ingram, male    DOB: 06/23/1936, 84 y.o.   MRN: [id number]  HPI The patient is here for follow up from the hospital.  Admitted 12/16/19 - 12/19/19  Recommendations for Outpatient Follow-up:  1. Discharged with Foley catheter given 2 episodes of urinary retention as noted on bladder scan requiring in and out cath.  Advised close urology follow-up. 2. Losartan, metoprolol, Terazosin were discontinued during this hospitalization for positive orthostatics and bradycardia on admission.  Recommend follow-up orthostatic vitals, heart rate to restart as deemed appropriate. 3. He noted weight loss, poor appetite for the last several months. His major complaints with difficulty urinating and constipation. Recommend age-appropriate cancer screening.  Constipation is a major issue, consider referral for colonoscopy which per the patient is due this year.   He went to the ED after syncope while waiting for his COVID vaccine.  His orthostatics were positive.  He had AKI with Cr 3.07, which improved with IVF.  Losartan, metoprolol were held.  Terazosin held.  He remained orthostatic positive, but had no dizziness.  Telemetry wnl.  EKG, Echo done.  Renal US showed no hydronephrosis.  Bladder scan showed urinary retention of 700 cc and he required in and out caths.  Foley left in place on D/c and removed by urology as an outpatient.      Echocardiogram done 12/17/2019 showed EF 65-70%, hyperdynamic LV function, no LVH, grade 1 diastolic dysfunction, no regional wall motion abnormalities, right ventricle function normal, no significant valvular disease  Since leaving the hospital he is not restarted the blood pressure medications.  He states he did leave the hospital with leg swelling, but it has gotten worse.  The swelling goes all the way up his upper posterior legs.  He has chronic dyspnea on exertion that he states has not changed.  He denies headaches or lightheadedness.  He  states he is urinating a lot at night, but denies any difficulty urinating.  He states some abdominal pain that is intermittent, but is not concerned about it.  He states sometimes it comes when he is hungry or sometimes it comes after he eats.  His eating has improved, but not quite back to normal.  He did have a TV dinner for dinner, but thinks he does not overdo the salt.   Medications and allergies reviewed with patient and updated if appropriate.  Patient Active Problem List   Diagnosis Date Noted  . Syncope 12/16/2019  . AKI (acute kidney injury) ([place]) 12/16/2019  . Hyperglycemia 08/18/2019  . BPH (benign prostatic hyperplasia) 05/31/2010  . THROMBOCYTOPENIA, CHRONIC 05/24/2009  . PREMATURE ATRIAL CONTRACTIONS 05/24/2009  . GERD 05/21/2008  . Nocturia 05/21/2008  . Hyperlipidemia 04/23/2007  . Essential hypertension 04/23/2007  . History of colonic polyps 03/15/2007    Current Outpatient Medications on File Prior to Visit  Medication Sig Dispense Refill  . amitriptyline (ELAVIL) 25 MG tablet Take 25 mg by mouth at bedtime.    Marland Kitchen aspirin 81 MG tablet Take 81 mg by mouth daily.      . Multiple Vitamins-Minerals (CENTRUM SILVER PO) Take 1 tablet by mouth daily.     Marland Kitchen omeprazole (PRILOSEC) 20 MG capsule Take 1 capsule by mouth once daily (Patient taking differently: Take 20 mg by mouth daily. ) 90 capsule 3  . polyethylene glycol (MIRALAX / GLYCOLAX) 17 g packet Take 17 g by mouth daily. 14 each 0  . senna (SENOKOT) 8.6 MG TABS tablet Take  1 tablet (8.6 mg total) by mouth daily. 120 tablet 0  . simvastatin (ZOCOR) 20 MG tablet TAKE 1 TABLET BY MOUTH AT BEDTIME (Patient taking differently: Take 20 mg by mouth at bedtime. ) 90 tablet 2  . tamsulosin (FLOMAX) 0.4 MG CAPS capsule Take 0.4 mg by mouth.     No current facility-administered medications on file prior to visit.    Past Medical History:  Diagnosis Date  . Cardiac murmur    Aortic  . Diverticulosis 2006  . GERD  (gastroesophageal reflux disease)   . History of colonic polyps 2003,2011   Dr Earlean Shawl  . History of syncope 1966   ? stress related  . Hyperlipidemia   . Hypertension    hypertensive response @ stress test    Past Surgical History:  Procedure Laterality Date  . CATARACT EXTRACTION  2003 & 2011   Dr Charise Killian  . COLONOSCOPY  04/2005   tics, no polyps. Dr.Medoff  . COLONOSCOPY W/ POLYPECTOMY  2003, 2011   neg 2006; due 2016. Dr Earlean Shawl    Social History   Socioeconomic History  . Marital status: Single    Spouse name: Not on file  . Number of children: Not on file  . Years of education: Not on file  . Highest education level: Not on file  Occupational History  . Not on file  Tobacco Use  . Smoking status: Former Smoker    Quit date: 11/28/1967    Years since quitting: 52.1  . Smokeless tobacco: Never Used  . Tobacco comment: smoked 1955-1969 , up to 1 ppd  Substance and Sexual Activity  . Alcohol use: Yes    Comment: RARELY  . Drug use: No  . Sexual activity: Not Currently  Other Topics Concern  . Not on file  Social History Narrative  . Not on file   Social Determinants of Health   Financial Resource Strain:   . Difficulty of Paying Living Expenses: Not on file  Food Insecurity:   . Worried About Charity fundraiser in the Last Year: Not on file  . Ran Out of Food in the Last Year: Not on file  Transportation Needs:   . Lack of Transportation (Medical): Not on file  . Lack of Transportation (Non-Medical): Not on file  Physical Activity:   . Days of Exercise per Week: Not on file  . Minutes of Exercise per Session: Not on file  Stress:   . Feeling of Stress : Not on file  Social Connections:   . Frequency of Communication with Friends and Family: Not on file  . Frequency of Social Gatherings with Friends and Family: Not on file  . Attends Religious Services: Not on file  . Active Member of Clubs or Organizations: Not on file  . Attends Archivist  Meetings: Not on file  . Marital Status: Not on file    Family History  Problem Relation Age of Onset  . Heart attack Mother 59  . Colon polyps Brother   . Pneumonia Brother   . Heart attack Brother 30  . Lung cancer Brother   . Other Father        Benign Brain Tumor  . Hypertension Brother   . Tuberculosis Paternal Uncle   . Other Maternal Grandmother   . Diabetes Neg Hx   . Stroke Neg Hx     Review of Systems  Constitutional: Negative for chills and fever.  Respiratory: Positive for shortness of breath (with moderate  exertion). Negative for cough and wheezing.   Cardiovascular: Positive for palpitations (when he gets up to go to the bathroom and exertion) and leg swelling. Negative for chest pain.  Gastrointestinal: Positive for abdominal pain (once in a while - lower abdomen). Negative for constipation, diarrhea and nausea.  Genitourinary: Positive for frequency. Negative for difficulty urinating and dysuria.  Neurological: Negative for light-headedness and headaches.       Objective:   Vitals:   12/29/19 1528  BP: 124/68  Pulse: 85  Resp: 16  Temp: 98.2 F (36.8 C)  SpO2: 98%   BP Readings from Last 3 Encounters:  12/29/19 124/68  12/19/19 126/81  08/19/19 122/78   Wt Readings from Last 3 Encounters:  12/29/19 168 lb 12.8 oz (76.6 kg)  12/17/19 166 lb 0.1 oz (75.3 kg)  08/19/19 167 lb (75.8 kg)   Body mass index is 21.97 kg/m.   Physical Exam    Constitutional: Appears well-developed and well-nourished. No distress.  HENT:  Head: Normocephalic and atraumatic.  Neck: Neck supple. No tracheal deviation present. No thyromegaly present.  No cervical lymphadenopathy Cardiovascular: Normal rate, regular rhythm and normal heart sounds.   No murmur heard. No carotid bruit .  3+ b/l LE pitting edema up to proximal posterior thighs Pulmonary/Chest: Effort normal and breath sounds normal. No respiratory distress. No has no wheezes. No rales.  Abdomen: soft, NT,  ND Skin: Skin is warm and dry. Not diaphoretic.  Psychiatric: Normal mood and affect. Behavior is normal.      Assessment & Plan:    See Problem List for Assessment and Plan of chronic medical problems.    This visit occurred during the SARS-CoV-2 public health emergency.  Safety protocols were in place, including screening questions prior to the visit, additional usage of staff PPE, and extensive cleaning of exam room while observing appropriate contact time as indicated for disinfecting solutions.

## 2019-12-29 ENCOUNTER — Ambulatory Visit (INDEPENDENT_AMBULATORY_CARE_PROVIDER_SITE_OTHER): Payer: Medicare HMO | Admitting: Internal Medicine

## 2019-12-29 ENCOUNTER — Other Ambulatory Visit: Payer: Self-pay

## 2019-12-29 ENCOUNTER — Encounter: Payer: Self-pay | Admitting: Internal Medicine

## 2019-12-29 VITALS — BP 124/68 | HR 85 | Temp 98.2°F | Resp 16 | Ht 73.5 in | Wt 168.8 lb

## 2019-12-29 DIAGNOSIS — K5900 Constipation, unspecified: Secondary | ICD-10-CM

## 2019-12-29 DIAGNOSIS — N179 Acute kidney failure, unspecified: Secondary | ICD-10-CM

## 2019-12-29 DIAGNOSIS — I10 Essential (primary) hypertension: Secondary | ICD-10-CM | POA: Diagnosis not present

## 2019-12-29 DIAGNOSIS — N400 Enlarged prostate without lower urinary tract symptoms: Secondary | ICD-10-CM | POA: Diagnosis not present

## 2019-12-29 DIAGNOSIS — R600 Localized edema: Secondary | ICD-10-CM | POA: Diagnosis not present

## 2019-12-29 LAB — COMPREHENSIVE METABOLIC PANEL
ALT: 41 U/L (ref 0–53)
AST: 36 U/L (ref 0–37)
Albumin: 2.6 g/dL — ABNORMAL LOW (ref 3.5–5.2)
Alkaline Phosphatase: 97 U/L (ref 39–117)
BUN: 37 mg/dL — ABNORMAL HIGH (ref 6–23)
CO2: 30 mEq/L (ref 19–32)
Calcium: 8.5 mg/dL (ref 8.4–10.5)
Chloride: 107 mEq/L (ref 96–112)
Creatinine, Ser: 1.84 mg/dL — ABNORMAL HIGH (ref 0.40–1.50)
GFR: 42.85 mL/min — ABNORMAL LOW (ref >60.00)
Glucose, Bld: 88 mg/dL (ref 70–99)
Potassium: 4.5 mEq/L (ref 3.5–5.1)
Sodium: 143 mEq/L (ref 135–145)
Total Bilirubin: 0.3 mg/dL (ref 0.2–1.2)
Total Protein: 5 g/dL — ABNORMAL LOW (ref 6.0–8.3)

## 2019-12-29 LAB — CBC WITH DIFFERENTIAL/PLATELET
Basophils Absolute: 0 10*3/uL (ref 0.0–0.1)
Basophils Relative: 0.4 % (ref 0.0–3.0)
Eosinophils Absolute: 0.1 10*3/uL (ref 0.0–0.7)
Eosinophils Relative: 1.7 % (ref 0.0–5.0)
HCT: 28.6 % — ABNORMAL LOW (ref 39.0–52.0)
Hemoglobin: 9.7 g/dL — ABNORMAL LOW (ref 13.0–17.0)
Lymphocytes Relative: 22.9 % (ref 12.0–46.0)
Lymphs Abs: 1.4 10*3/uL (ref 0.7–4.0)
MCHC: 33.8 g/dL (ref 30.0–36.0)
MCV: 95.5 fl (ref 78.0–100.0)
Monocytes Absolute: 0.6 10*3/uL (ref 0.1–1.0)
Monocytes Relative: 10.2 % (ref 3.0–12.0)
Neutro Abs: 3.9 10*3/uL (ref 1.4–7.7)
Neutrophils Relative %: 64.8 % (ref 43.0–77.0)
Platelets: 200 10*3/uL (ref 150.0–400.0)
RBC: 2.99 Mil/uL — ABNORMAL LOW (ref 4.22–5.81)
RDW: 14.2 % (ref 11.5–15.5)
WBC: 6 10*3/uL (ref 4.0–10.5)

## 2019-12-29 LAB — BRAIN NATRIURETIC PEPTIDE: Pro B Natriuretic peptide (BNP): 224 pg/mL — ABNORMAL HIGH (ref 0.0–100.0)

## 2019-12-29 NOTE — Assessment & Plan Note (Signed)
Chronic Was orthostatic in the hospital after syncopal episode and metoprolol, losartan and Terazosin were discontinued Blood pressure good here today-will not restart and monitor

## 2019-12-29 NOTE — Assessment & Plan Note (Signed)
Has been experiencing some constipation and was prescribed Senokot and MiraLAX, but states he is not currently taking it.  He actually states his stools are loose Has some intermittent abdominal pain, but is not able to tell me more about the pain, except that some of the lower abdomen and does not seem to have a specific pattern We will check blood work and may need CT of the abdomen and pelvis

## 2019-12-29 NOTE — Assessment & Plan Note (Signed)
In the hospital he had AKI, which resolved with fluids which may have contributed to some of the edema although it is hard to tell when it started CMP today If kidney function is okay he will require some diuretics

## 2019-12-29 NOTE — Assessment & Plan Note (Signed)
Foley placed in hospital secondary to urinary retention-removed by urology as an outpatient States no difficulty urinating, but frequent urination Still taking Flomax-continue

## 2019-12-29 NOTE — Assessment & Plan Note (Signed)
New He states he had some swelling when he left the hospital, but according to the discharge paperwork he had no swelling He has 3+ pitting bilateral lower extremity edema up to his proximal posterior thighs Had recent echo-EF normal, grade 1 diastolic dysfunction, chronic shortness of breath that is unchanged.  He is not the best historian, so he could be having more symptoms the knees admitting to CBC, CMP, BNP We will likely need Lasix, but want to see the kidney function first-we will call him tomorrow to discuss medication and then will need close follow-up

## 2019-12-29 NOTE — Patient Instructions (Signed)
  Blood work was ordered.     Medications reviewed and updated.  Changes include :   none   Limit your salt.   Elevate your legs.      We will call you tomorrow with your results.

## 2019-12-30 ENCOUNTER — Other Ambulatory Visit: Payer: Self-pay | Admitting: Internal Medicine

## 2019-12-30 MED ORDER — FUROSEMIDE 40 MG PO TABS: 40.0000 mg | ORAL_TABLET | Freq: Every day | ORAL | 0 refills | Status: DC

## 2019-12-30 NOTE — Telephone Encounter (Signed)
Left 2nd VM to call back

## 2019-12-30 NOTE — Telephone Encounter (Signed)
LVM for pt to call back.

## 2019-12-30 NOTE — Telephone Encounter (Signed)
His kidney function is still decreased.  His protein level is his blood is low and I would like him to try to eat more protein.  Because of his fluid overload I would like to try him on a water pill that he will take for a few days to try to get some of the fluid off of him.  I would like to see him later this week in the office  - maybe Friday morning or Thursday to recheck his swelling and recheck his kidney function.    Start lasix 40 mg daily in am - pending.  Start today if possible.   Follow up with me Thursday or Friday in office.

## 2020-01-01 NOTE — Progress Notes (Signed)
Subjective:    Patient ID: Alvin Ingram, male    DOB: 05/05/1936, 84 y.o.   MRN: [id number]  HPI The patient is here for follow up of her leg edema and decreased kidney function.  He has been taking lasix 40 mg daily and his leg swelling has not improved.  He is a couple of pounds heavier today than he was earlier this week.  He has taken it for 5 days.  He continues to have some constipation.  He has been taking the MiraLAX and senna and he feels his bowel movements are close to normal.  He still has some lower abdominal pain.  He states it is mild-moderate.  Sometimes overnight and sometimes he has it when he wakes up.  He denies any pain with bowel movements.  He denies any blood in the stool.  He states he is eating pretty well.   He has had a few days of dysuria.  He denies any blood in urine.  He denies difficulty urinating.  When he was in the hospital he did have urinary retention that required a Foley catheter, which was removed by urology after discharge.  He is taking his Flomax daily.     Medications and allergies reviewed with patient and updated if appropriate.  Patient Active Problem List   Diagnosis Date Noted  . Bilateral leg edema 12/29/2019  . Constipation 12/29/2019  . Syncope 12/16/2019  . AKI (acute kidney injury) ([place]) 12/16/2019  . Hyperglycemia 08/18/2019  . BPH (benign prostatic hyperplasia) 05/31/2010  . THROMBOCYTOPENIA, CHRONIC 05/24/2009  . PREMATURE ATRIAL CONTRACTIONS 05/24/2009  . GERD 05/21/2008  . Nocturia 05/21/2008  . Hyperlipidemia 04/23/2007  . Essential hypertension 04/23/2007  . History of colonic polyps 03/15/2007    Current Outpatient Medications on File Prior to Visit  Medication Sig Dispense Refill  . amitriptyline (ELAVIL) 25 MG tablet Take 25 mg by mouth at bedtime.    Marland Kitchen aspirin 81 MG tablet Take 81 mg by mouth daily.      . furosemide (LASIX) 40 MG tablet Take 1 tablet (40 mg total) by mouth daily. 30 tablet 0  . Multiple  Vitamins-Minerals (CENTRUM SILVER PO) Take 1 tablet by mouth daily.     Marland Kitchen omeprazole (PRILOSEC) 20 MG capsule Take 1 capsule by mouth once daily (Patient taking differently: Take 20 mg by mouth daily. ) 90 capsule 3  . polyethylene glycol (MIRALAX / GLYCOLAX) 17 g packet Take 17 g by mouth daily. 14 each 0  . senna (SENOKOT) 8.6 MG TABS tablet Take 1 tablet (8.6 mg total) by mouth daily. 120 tablet 0  . simvastatin (ZOCOR) 20 MG tablet TAKE 1 TABLET BY MOUTH AT BEDTIME (Patient taking differently: Take 20 mg by mouth at bedtime. ) 90 tablet 2  . tamsulosin (FLOMAX) 0.4 MG CAPS capsule Take 0.4 mg by mouth.     No current facility-administered medications on file prior to visit.    Past Medical History:  Diagnosis Date  . Cardiac murmur    Aortic  . Diverticulosis 2006  . GERD (gastroesophageal reflux disease)   . History of colonic polyps 2003,2011   Dr Earlean Shawl  . History of syncope 1966   ? stress related  . Hyperlipidemia   . Hypertension    hypertensive response @ stress test    Past Surgical History:  Procedure Laterality Date  . CATARACT EXTRACTION  2003 & 2011   Dr Charise Killian  . COLONOSCOPY  04/2005   tics, no polyps.  Dr.Medoff  . COLONOSCOPY W/ POLYPECTOMY  2003, 2011   neg 2006; due 2016. Dr Earlean Shawl    Social History   Socioeconomic History  . Marital status: Single    Spouse name: Not on file  . Number of children: Not on file  . Years of education: Not on file  . Highest education level: Not on file  Occupational History  . Not on file  Tobacco Use  . Smoking status: Former Smoker    Quit date: 11/28/1967    Years since quitting: 52.1  . Smokeless tobacco: Never Used  . Tobacco comment: smoked 1955-1969 , up to 1 ppd  Substance and Sexual Activity  . Alcohol use: Yes    Comment: RARELY  . Drug use: No  . Sexual activity: Not Currently  Other Topics Concern  . Not on file  Social History Narrative  . Not on file   Social Determinants of Health   Financial  Resource Strain:   . Difficulty of Paying Living Expenses: Not on file  Food Insecurity:   . Worried About Charity fundraiser in the Last Year: Not on file  . Ran Out of Food in the Last Year: Not on file  Transportation Needs:   . Lack of Transportation (Medical): Not on file  . Lack of Transportation (Non-Medical): Not on file  Physical Activity:   . Days of Exercise per Week: Not on file  . Minutes of Exercise per Session: Not on file  Stress:   . Feeling of Stress : Not on file  Social Connections:   . Frequency of Communication with Friends and Family: Not on file  . Frequency of Social Gatherings with Friends and Family: Not on file  . Attends Religious Services: Not on file  . Active Member of Clubs or Organizations: Not on file  . Attends Archivist Meetings: Not on file  . Marital Status: Not on file    Family History  Problem Relation Age of Onset  . Heart attack Mother 5  . Colon polyps Brother   . Pneumonia Brother   . Heart attack Brother 17  . Lung cancer Brother   . Other Father        Benign Brain Tumor  . Hypertension Brother   . Tuberculosis Paternal Uncle   . Other Maternal Grandmother   . Diabetes Neg Hx   . Stroke Neg Hx     Review of Systems  Constitutional: Negative for chills and fever.  Respiratory: Positive for shortness of breath (with exertion).   Cardiovascular: Positive for leg swelling. Negative for chest pain and palpitations (heart racing sometimes).  Gastrointestinal: Positive for abdominal pain (lower abdomen - mild-moderate) and constipation. Negative for blood in stool and nausea.  Genitourinary: Negative for difficulty urinating, dysuria and hematuria.       Feels like he is emptying his bladder  Neurological: Negative for light-headedness and headaches.       Objective:   Vitals:   01/02/20 0927 01/02/20 1058  BP: (!) 154/84 (!) 144/80  Pulse: 87   Resp: 16   Temp: 98.3 F (36.8 C)   SpO2: 99%    BP  Readings from Last 3 Encounters:  01/02/20 (!) 144/80  12/29/19 124/68  12/19/19 126/81   Wt Readings from Last 3 Encounters:  01/02/20 171 lb (77.6 kg)  12/29/19 168 lb 12.8 oz (76.6 kg)  12/17/19 166 lb 0.1 oz (75.3 kg)   Body mass index is 22.25 kg/m.  Physical Exam    Constitutional: Appears well-developed and well-nourished. No distress.  HENT:  Head: Normocephalic and atraumatic.  Neck: Neck supple. No tracheal deviation present. No thyromegaly present.  No cervical lymphadenopathy Cardiovascular: Normal rate, regular rhythm and normal heart sounds.   No murmur heard. No carotid bruit .  3+ bilateral lower extremity pitting edema-no significant improvement after 5 days of Lasix 40 mg daily Pulmonary/Chest: Effort normal and breath sounds normal. No respiratory distress. No has no wheezes. No rales. Abdomen: Increased bowel sounds.  Soft, flat.  Tenderness with palpation suprapubic region left lower quadrant without rebound or guarding.  No obvious mass,?  Bladder enlarged Skin: Skin is warm and dry. Not diaphoretic.  Psychiatric: Normal mood and affect. Behavior is normal.      Assessment & Plan:    See Problem List for Assessment and Plan of chronic medical problems.    This visit occurred during the SARS-CoV-2 public health emergency.  Safety protocols were in place, including screening questions prior to the visit, additional usage of staff PPE, and extensive cleaning of exam room while observing appropriate contact time as indicated for disinfecting solutions.

## 2020-01-02 ENCOUNTER — Encounter: Payer: Self-pay | Admitting: Internal Medicine

## 2020-01-02 ENCOUNTER — Other Ambulatory Visit: Payer: Self-pay | Admitting: *Deleted

## 2020-01-02 ENCOUNTER — Ambulatory Visit
Admission: RE | Admit: 2020-01-02 | Discharge: 2020-01-02 | Disposition: A | Discharge status: Home / Self Care | Payer: Medicare HMO | Source: Ambulatory Visit | Attending: Internal Medicine | Admitting: Internal Medicine

## 2020-01-02 ENCOUNTER — Other Ambulatory Visit: Payer: Self-pay

## 2020-01-02 ENCOUNTER — Ambulatory Visit (INDEPENDENT_AMBULATORY_CARE_PROVIDER_SITE_OTHER): Payer: Medicare HMO | Admitting: Internal Medicine

## 2020-01-02 VITALS — BP 144/80 | HR 87 | Temp 98.3°F | Resp 16 | Ht 73.5 in | Wt 171.0 lb

## 2020-01-02 DIAGNOSIS — I10 Essential (primary) hypertension: Secondary | ICD-10-CM

## 2020-01-02 DIAGNOSIS — R600 Localized edema: Secondary | ICD-10-CM | POA: Diagnosis not present

## 2020-01-02 DIAGNOSIS — R300 Dysuria: Secondary | ICD-10-CM | POA: Insufficient documentation

## 2020-01-02 DIAGNOSIS — K5730 Diverticulosis of large intestine without perforation or abscess without bleeding: Secondary | ICD-10-CM | POA: Diagnosis not present

## 2020-01-02 DIAGNOSIS — R1030 Lower abdominal pain, unspecified: Secondary | ICD-10-CM

## 2020-01-02 DIAGNOSIS — N179 Acute kidney failure, unspecified: Secondary | ICD-10-CM

## 2020-01-02 LAB — BASIC METABOLIC PANEL
BUN: 31 mg/dL — ABNORMAL HIGH (ref 6–23)
CO2: 32 mEq/L (ref 19–32)
Calcium: 8.7 mg/dL (ref 8.4–10.5)
Chloride: 106 mEq/L (ref 96–112)
Creatinine, Ser: 2.04 mg/dL — ABNORMAL HIGH (ref 0.40–1.50)
GFR: 38.04 mL/min — ABNORMAL LOW (ref >60.00)
Glucose, Bld: 81 mg/dL (ref 70–99)
Potassium: 4.5 mEq/L (ref 3.5–5.1)
Sodium: 143 mEq/L (ref 135–145)

## 2020-01-02 LAB — URINALYSIS, ROUTINE W REFLEX MICROSCOPIC
Bilirubin Urine: NEGATIVE
Ketones, ur: NEGATIVE
Leukocytes,Ua: NEGATIVE
Nitrite: NEGATIVE
Specific Gravity, Urine: 1.02 (ref 1.000–1.030)
Total Protein, Urine: 100 — AB
Urine Glucose: NEGATIVE
Urobilinogen, UA: 0.2 (ref 0.0–1.0)
pH: 6.5 (ref 5.0–8.0)

## 2020-01-02 NOTE — Assessment & Plan Note (Signed)
Acute Having some lower abdominal pain-suprapubic and left lower quadrant and he is tender on exam Has had some constipation, which is new Also has had some urinary retention and required a Foley when he was in the hospital Also with AKI Concern for mass causing obstruction, hydronephrosis, prostate causing obstruction, possibility of colon mass Repeat BMP CT of the abdomen and pelvis without contrast given AKI

## 2020-01-02 NOTE — Patient Outreach (Signed)
Telephone outreach.  Mr. Alvin Ingram has had appts X2 with Dr. Quay Burow this week. He continues to have significant LLE. He is having some abdominal complaints and Dr. Quay Burow has sent him for some X Rays.  He reports he did receive the educational material I sent but he has not really studied on it yet. He was very appreciative of the materials.  He says he does have a scale but it needs batteries. Today's weight at the MD office was 171# up 3# from earlier in the week.  He has other things to do right now and we agreed I will call him again next week.  Eulah Pont. Myrtie Neither, MSN, [facility] Gerontological Nurse Practitioner [facility] Care Management [phone]

## 2020-01-02 NOTE — Assessment & Plan Note (Signed)
Has had some dysuria and may be experiencing some urinary retention He denies any difficulty urinating or weak stream Urinalysis, urine culture

## 2020-01-02 NOTE — Assessment & Plan Note (Signed)
Chronic Blood pressure better on repeat-initially elevated Continue current medications at current doses BMP

## 2020-01-02 NOTE — Assessment & Plan Note (Signed)
AKI seen when he went into the hospital-earlier this week there was some improvement, but still significantly decreased Has significant lower extremity edema and had some urinary retention when he was hospitalized Did have an ultrasound of his kidneys and bladder that showed possible chronic medical disease in his kidneys, bladder okay, has a large prostate, which could be causing some of the retention BMP today We will continue Lasix for now-we will may change depending on BMP results CT of the abdomen and pelvis

## 2020-01-02 NOTE — Assessment & Plan Note (Signed)
Acute on chronic-he has had some chronic lower extremity edema, but it is much worse since his hospitalization 3+ bilateral lower extremity pitting edema No improvement after 5 days of Lasix 40 mg daily BMP Concern for possible urinary retention-will obtain imaging

## 2020-01-02 NOTE — Patient Instructions (Signed)
  Blood work and urine ordered.     Medications reviewed and updated.  Changes include :  none     A Ct scan of you abdomen was ordered.

## 2020-01-03 LAB — URINE CULTURE: Result:: NO GROWTH

## 2020-01-04 ENCOUNTER — Other Ambulatory Visit: Payer: Self-pay | Admitting: Internal Medicine

## 2020-01-04 DIAGNOSIS — N179 Acute kidney failure, unspecified: Secondary | ICD-10-CM

## 2020-01-04 NOTE — Telephone Encounter (Signed)
His kidney function is slightly worse.  I would like to refer him to a kidney specialist if it is ok with him.  Urine tests does not reveal an infection   His Ct scan shows possible inflammation in his lower colon.  This could be causing his pain.  He needs to take two antibiotics to get rid of the infection  Prescriptions pending.  Referral pending.

## 2020-01-05 NOTE — Telephone Encounter (Signed)
LVM for pt to call back in regards.  

## 2020-01-06 ENCOUNTER — Other Ambulatory Visit: Payer: Self-pay

## 2020-01-06 ENCOUNTER — Encounter: Payer: Self-pay | Admitting: *Deleted

## 2020-01-06 ENCOUNTER — Ambulatory Visit: Payer: Medicare HMO | Attending: Internal Medicine

## 2020-01-06 DIAGNOSIS — Z23 Encounter for immunization: Secondary | ICD-10-CM | POA: Insufficient documentation

## 2020-01-06 MED ORDER — CIPROFLOXACIN HCL 500 MG PO TABS: 500.0000 mg | ORAL_TABLET | Freq: Two times a day (BID) | ORAL | 0 refills | Status: DC

## 2020-01-06 MED ORDER — METRONIDAZOLE 500 MG PO TABS: 500.0000 mg | ORAL_TABLET | Freq: Three times a day (TID) | ORAL | 0 refills | Status: DC

## 2020-01-06 NOTE — Telephone Encounter (Signed)
Pt aware of response and expressed understanding.  

## 2020-01-06 NOTE — Patient Outreach (Signed)
[facility]) Care Management  01/06/2020  Matthews Garnier February 07, 1936 [id number]   Social Work referral received on 12/30/19 from NP, [employer].  "Please provide VA benefit information to this very nice gentleman. He is a [employer]. He says he tried to get benefits before but didn't qualify" Unsuccessful outreach to patient today.  Left voicemail message and mailed Unsuccessful Outreach Letter.  Will attempt to reach again within four business days.  Ronn Melena, BSW Social Worker [phone]

## 2020-01-06 NOTE — Progress Notes (Signed)
   Covid-19 Vaccination Clinic  Name:  Alvin Ingram    MRN: [id number] DOB: 1936-10-15  01/06/2020  Mr. Alvin Ingram was observed post Covid-19 immunization for 15 minutes without incidence. He was provided with Vaccine Information Sheet and instruction to access the V-Safe system.   Mr. Alvin Ingram was instructed to call 911 with any severe reactions post vaccine: Marland Kitchen Difficulty breathing  . Swelling of your face and throat  . A fast heartbeat  . A bad rash all over your body  . Dizziness and weakness    Immunizations Administered    Name Date Dose VIS Date Route   Pfizer COVID-19 Vaccine 01/06/2020 11:47 AM 0.3 mL 11/07/2019 Intramuscular   Manufacturer: [place]   Lot: [id number]   [place]: [id number]

## 2020-01-06 NOTE — Telephone Encounter (Signed)
Left 2nd VM to call back in regards.

## 2020-01-08 ENCOUNTER — Ambulatory Visit: Payer: Medicare HMO

## 2020-01-08 ENCOUNTER — Other Ambulatory Visit: Payer: Self-pay | Admitting: *Deleted

## 2020-01-08 ENCOUNTER — Ambulatory Visit: Payer: Self-pay

## 2020-01-09 ENCOUNTER — Other Ambulatory Visit: Payer: Self-pay | Admitting: *Deleted

## 2020-01-09 NOTE — Patient Outreach (Signed)
Telephone outreach.  Alvin Ingram answered the phone but says he is sleeping and he will talk to me next week.  I am scheduling him for a call next Friday.  Alvin Pont. Myrtie Neither, MSN, [facility] Gerontological Nurse Practitioner [facility] Care Management [phone]

## 2020-01-12 ENCOUNTER — Other Ambulatory Visit: Payer: Self-pay

## 2020-01-12 ENCOUNTER — Telehealth: Payer: Self-pay | Admitting: Internal Medicine

## 2020-01-12 ENCOUNTER — Ambulatory Visit: Payer: Self-pay

## 2020-01-12 NOTE — Telephone Encounter (Signed)
Left 2nd VM to call back in regards.

## 2020-01-12 NOTE — Patient Outreach (Signed)
[facility]) Care Management  01/12/2020  Alvin Ingram 04/05/1936 [id number]   Social Work referral received on 12/30/19 from NP, Deloria Lair.  "Please provide VA benefit information to this very nice gentleman. He is a [employer]. He says he tried to get benefits before but didn't qualify" Second unsuccessful outreach to patient today.  Left voicemail message.  Mailed Unsuccessful Outreach Letter on 01/06/20.   Will make final attempt to contact patient within the next four business days  [employer], [place] Social Worker [phone]

## 2020-01-12 NOTE — Telephone Encounter (Signed)
Please call him to see how he is feeling.  Is his abdominal pain better after starting the antibiotics?  Is his leg swelling the same?   Does he have an appointment with the kidney specialist?

## 2020-01-12 NOTE — Telephone Encounter (Signed)
LVM for pt to call back in regards.  

## 2020-01-13 NOTE — Telephone Encounter (Signed)
Pt states he is doing better. Stomach pain is better and swelling has eased up. States his legs are not as tight feeling. Has not set up appointment with kidney doctor yet. Will check on referral.

## 2020-01-14 ENCOUNTER — Ambulatory Visit: Payer: Self-pay

## 2020-01-14 ENCOUNTER — Other Ambulatory Visit: Payer: Self-pay

## 2020-01-14 NOTE — Patient Outreach (Signed)
[name]) Care Management  01/14/2020  Alvin Ingram 14-Aug-1936 [id number]   Social Work referral received on 12/30/19 from NP, Deloria Lair. "Please provide VA benefit information to this very nice gentleman. He is a [employer]. He says he tried to get benefits before but didn't qualify" Third unsuccessful outreach to patient today. Left voicemail message.  Mailed Unsuccessful Outreach Letter on 01/06/20.  Social Work case is being closed due to inability to contact.  Informed NP, Deloria Lair, of closure.    Ronn Melena, BSW Social Worker [phone]

## 2020-01-16 ENCOUNTER — Encounter: Payer: Self-pay | Admitting: *Deleted

## 2020-01-16 ENCOUNTER — Other Ambulatory Visit: Payer: Self-pay | Admitting: Internal Medicine

## 2020-01-16 ENCOUNTER — Other Ambulatory Visit: Payer: Self-pay | Admitting: *Deleted

## 2020-01-16 NOTE — Patient Outreach (Signed)
Telephone outreach unsuccessful. Was able to leave a message and requested a return call.  I will close this case if pt chooses not to engage.  Eulah Pont. Myrtie Neither, MSN, [facility] Gerontological Nurse Practitioner [facility] Care Management [phone]

## 2020-01-20 ENCOUNTER — Telehealth: Payer: Self-pay

## 2020-01-20 NOTE — Telephone Encounter (Signed)
LVM for pt to call back in regards.  

## 2020-01-20 NOTE — Telephone Encounter (Signed)
New message    Patient calling stating the nurse called him today returning call back

## 2020-01-20 NOTE — Telephone Encounter (Signed)
LVM for pt to call back.

## 2020-01-21 ENCOUNTER — Other Ambulatory Visit: Payer: Self-pay | Admitting: Internal Medicine

## 2020-01-21 DIAGNOSIS — R351 Nocturia: Secondary | ICD-10-CM | POA: Diagnosis not present

## 2020-01-21 DIAGNOSIS — R609 Edema, unspecified: Secondary | ICD-10-CM | POA: Diagnosis not present

## 2020-01-21 DIAGNOSIS — N401 Enlarged prostate with lower urinary tract symptoms: Secondary | ICD-10-CM | POA: Diagnosis not present

## 2020-01-21 NOTE — Telephone Encounter (Signed)
Spoke with pt. Documented in refill request.

## 2020-01-23 ENCOUNTER — Other Ambulatory Visit: Payer: Self-pay | Admitting: *Deleted

## 2020-01-23 NOTE — Patient Outreach (Signed)
Case closure due to inablity to reach pt by phone or letter. Sent closure letter.  Alvin Ingram. Alvin Neither, MSN, [facility] Gerontological Nurse Practitioner [facility] Care Management [phone]

## 2020-02-12 NOTE — Progress Notes (Signed)
Subjective:    Patient ID: Alvin Ingram, male    DOB: 04-26-1936, 84 y.o.   MRN: [id number]  HPI The patient is here for an acute visit.  He is here with his nephew.  The patient is not a good historian.  He has been experiencing generalized weakness, shortness of breath with activity and fatigue.  He has also had significant weight loss.   he drinks a little fluids throughout the day, but not much.  He states he has not been eating much.  He has decreased appetite.  He also states it is difficult for him to make meals.  He currently lives alone.  He has been experiencing lightheadedness and has fallen a few times.  He is weak and does use his walker to ambulate.  He has to stop in the middle of doing his ADLs to rest.   He did have a change in bowels this week.  He had an constipation.  That has improved.  He has-intermittent abdominal pain.  He has been some blood in the stool.  He has had nausea on a couple of occasions.  His leg swelling is better.  He does wear compression socks daily.  He does not always elevate his legs.  Medications and allergies reviewed with patient and updated if appropriate.  Patient Active Problem List   Diagnosis Date Noted  . Lower abdominal pain 01/02/2020  . Dysuria 01/02/2020  . Bilateral leg edema 12/29/2019  . Constipation 12/29/2019  . Syncope 12/16/2019  . AKI (acute kidney injury) ([place]) 12/16/2019  . Hyperglycemia 08/18/2019  . BPH (benign prostatic hyperplasia) 05/31/2010  . THROMBOCYTOPENIA, CHRONIC 05/24/2009  . PREMATURE ATRIAL CONTRACTIONS 05/24/2009  . GERD 05/21/2008  . Nocturia 05/21/2008  . Hyperlipidemia 04/23/2007  . Essential hypertension 04/23/2007  . History of colonic polyps 03/15/2007    Current Outpatient Medications on File Prior to Visit  Medication Sig Dispense Refill  . amitriptyline (ELAVIL) 25 MG tablet Take 25 mg by mouth at bedtime.    Marland Kitchen aspirin 81 MG tablet Take 81 mg by mouth daily.      . furosemide  (LASIX) 40 MG tablet Take 1 tablet by mouth once daily 30 tablet 0  . Multiple Vitamins-Minerals (CENTRUM SILVER PO) Take 1 tablet by mouth daily.     Marland Kitchen omeprazole (PRILOSEC) 20 MG capsule Take 1 capsule by mouth once daily (Patient taking differently: Take 20 mg by mouth daily. ) 90 capsule 3  . polyethylene glycol (MIRALAX / GLYCOLAX) 17 g packet Take 17 g by mouth daily. 14 each 0  . senna (SENOKOT) 8.6 MG TABS tablet Take 1 tablet (8.6 mg total) by mouth daily. 120 tablet 0  . simvastatin (ZOCOR) 20 MG tablet TAKE 1 TABLET BY MOUTH AT BEDTIME (Patient taking differently: Take 20 mg by mouth at bedtime. ) 90 tablet 2  . tamsulosin (FLOMAX) 0.4 MG CAPS capsule Take 0.4 mg by mouth.     No current facility-administered medications on file prior to visit.    Past Medical History:  Diagnosis Date  . Cardiac murmur    Aortic  . Diverticulosis 2006  . GERD (gastroesophageal reflux disease)   . History of colonic polyps 2003,2011   Dr Earlean Shawl  . History of syncope 1966   ? stress related  . Hyperlipidemia   . Hypertension    hypertensive response @ stress test    Past Surgical History:  Procedure Laterality Date  . CATARACT EXTRACTION  2003 & 2011  Dr Charise Killian  . COLONOSCOPY  04/2005   tics, no polyps. Dr.Medoff  . COLONOSCOPY W/ POLYPECTOMY  2003, 2011   neg 2006; due 2016. Dr Earlean Shawl    Social History   Socioeconomic History  . Marital status: Single    Spouse name: Not on file  . Number of children: Not on file  . Years of education: Not on file  . Highest education level: Not on file  Occupational History  . Not on file  Tobacco Use  . Smoking status: Former Smoker    Quit date: 11/28/1967    Years since quitting: 52.2  . Smokeless tobacco: Never Used  . Tobacco comment: smoked 1955-1969 , up to 1 ppd  Substance and Sexual Activity  . Alcohol use: Yes    Comment: RARELY  . Drug use: No  . Sexual activity: Not Currently  Other Topics Concern  . Not on file  Social  History Narrative  . Not on file   Social Determinants of Health   Financial Resource Strain:   . Difficulty of Paying Living Expenses:   Food Insecurity:   . Worried About Charity fundraiser in the Last Year:   . Arboriculturist in the Last Year:   Transportation Needs:   . Film/video editor (Medical):   Marland Kitchen Lack of Transportation (Non-Medical):   Physical Activity:   . Days of Exercise per Week:   . Minutes of Exercise per Session:   Stress:   . Feeling of Stress :   Social Connections:   . Frequency of Communication with Friends and Family:   . Frequency of Social Gatherings with Friends and Family:   . Attends Religious Services:   . Active Member of Clubs or Organizations:   . Attends Archivist Meetings:   Marland Kitchen Marital Status:     Family History  Problem Relation Age of Onset  . Heart attack Mother 98  . Colon polyps Brother   . Pneumonia Brother   . Heart attack Brother 52  . Lung cancer Brother   . Other Father        Benign Brain Tumor  . Hypertension Brother   . Tuberculosis Paternal Uncle   . Other Maternal Grandmother   . Diabetes Neg Hx   . Stroke Neg Hx     Review of Systems  Constitutional: Positive for appetite change (varies, mostly bad) and fever (subjective).  Respiratory: Positive for shortness of breath (at times). Negative for cough and wheezing.   Cardiovascular: Positive for palpitations (intermittent) and leg swelling (compression hose - controlled). Negative for chest pain.  Gastrointestinal: Positive for abdominal pain (sometimes before a BM or urinate - improved after), blood in stool and nausea (on ocasion). Negative for constipation, diarrhea and vomiting.       Slight gerd at times  Genitourinary: Positive for frequency. Negative for difficulty urinating, dysuria and hematuria.  Musculoskeletal: Positive for arthralgias and back pain (sometimes).  Neurological: Positive for light-headedness. Negative for weakness (nonfocal)  and headaches.       Objective:   Vitals:   02/13/20 1321  BP: 122/62  Pulse: 67  Temp: 97.7 F (36.5 C)  SpO2: 95%   BP Readings from Last 3 Encounters:  02/13/20 122/62  01/02/20 (!) 144/80  12/29/19 124/68   Wt Readings from Last 3 Encounters:  02/13/20 137 lb (62.1 kg)  01/02/20 171 lb (77.6 kg)  12/29/19 168 lb 12.8 oz (76.6 kg)   Body mass index  is 17.83 kg/m.   Physical Exam Constitutional:      General: He is not in acute distress.    Appearance: Normal appearance. He is not ill-appearing.     Comments: Cachectic appearing  HENT:     Head: Atraumatic.  Eyes:     General: No scleral icterus. Cardiovascular:     Rate and Rhythm: Normal rate and regular rhythm.     Heart sounds: No murmur.  Pulmonary:     Effort: Pulmonary effort is normal. No respiratory distress.     Breath sounds: Normal breath sounds. No wheezing or rales.     Comments: Has some shortness of breath with mild activity Abdominal:     General: There is no distension.     Palpations: Abdomen is soft. There is no mass.     Tenderness: There is abdominal tenderness (Minimal, nonspecific generalized tenderness). There is no guarding or rebound.     Hernia: No hernia is present.  Musculoskeletal:     Cervical back: No tenderness.     Right lower leg: Edema (1+ pitting edema halfway up the lower leg) present.     Left lower leg: Edema (1+ pitting edema halfway up lower leg) present.  Lymphadenopathy:     Cervical: No cervical adenopathy.  Skin:    General: Skin is warm and dry.  Neurological:     Mental Status: He is alert.            Assessment & Plan:    I spent 40 minutes with the patient and his nephew, reviewing his chart, obtaining history, ordering tests and coordinating care for his symptoms of significant weight loss, shortness of breath, weakness, recurrent falls, abdominal pain   See Problem List for Assessment and Plan of chronic medical problems.    This visit  occurred during the SARS-CoV-2 public health emergency.  Safety protocols were in place, including screening questions prior to the visit, additional usage of staff PPE, and extensive cleaning of exam room while observing appropriate contact time as indicated for disinfecting solutions.

## 2020-02-12 NOTE — Patient Instructions (Addendum)
  Blood work was ordered.  A Chest xray was ordered.    Medications reviewed and updated.  Changes include :   none

## 2020-02-13 ENCOUNTER — Ambulatory Visit (INDEPENDENT_AMBULATORY_CARE_PROVIDER_SITE_OTHER): Payer: Medicare HMO | Admitting: Internal Medicine

## 2020-02-13 ENCOUNTER — Ambulatory Visit (INDEPENDENT_AMBULATORY_CARE_PROVIDER_SITE_OTHER): Payer: Medicare HMO

## 2020-02-13 ENCOUNTER — Encounter: Payer: Self-pay | Admitting: Internal Medicine

## 2020-02-13 ENCOUNTER — Other Ambulatory Visit: Payer: Self-pay

## 2020-02-13 VITALS — BP 122/62 | HR 67 | Temp 97.7°F | Ht 73.5 in | Wt 137.0 lb

## 2020-02-13 DIAGNOSIS — R0602 Shortness of breath: Secondary | ICD-10-CM | POA: Diagnosis not present

## 2020-02-13 DIAGNOSIS — R600 Localized edema: Secondary | ICD-10-CM

## 2020-02-13 DIAGNOSIS — R5383 Other fatigue: Secondary | ICD-10-CM | POA: Insufficient documentation

## 2020-02-13 DIAGNOSIS — R634 Abnormal weight loss: Secondary | ICD-10-CM

## 2020-02-13 DIAGNOSIS — R627 Adult failure to thrive: Secondary | ICD-10-CM | POA: Diagnosis not present

## 2020-02-13 DIAGNOSIS — R5381 Other malaise: Secondary | ICD-10-CM | POA: Diagnosis not present

## 2020-02-13 DIAGNOSIS — R1013 Epigastric pain: Secondary | ICD-10-CM

## 2020-02-13 LAB — CBC WITH DIFFERENTIAL/PLATELET
Basophils Absolute: 0 10*3/uL (ref 0.0–0.1)
Basophils Relative: 0.4 % (ref 0.0–3.0)
Eosinophils Absolute: 0.1 10*3/uL (ref 0.0–0.7)
Eosinophils Relative: 0.9 % (ref 0.0–5.0)
HCT: 37.4 % — ABNORMAL LOW (ref 39.0–52.0)
Hemoglobin: 12.6 g/dL — ABNORMAL LOW (ref 13.0–17.0)
Lymphocytes Relative: 24.1 % (ref 12.0–46.0)
Lymphs Abs: 1.7 10*3/uL (ref 0.7–4.0)
MCHC: 33.7 g/dL (ref 30.0–36.0)
MCV: 97.7 fl (ref 78.0–100.0)
Monocytes Absolute: 0.6 10*3/uL (ref 0.1–1.0)
Monocytes Relative: 8.5 % (ref 3.0–12.0)
Neutro Abs: 4.5 10*3/uL (ref 1.4–7.7)
Neutrophils Relative %: 66.1 % (ref 43.0–77.0)
Platelets: 157 10*3/uL (ref 150.0–400.0)
RBC: 3.82 Mil/uL — ABNORMAL LOW (ref 4.22–5.81)
RDW: 14.7 % (ref 11.5–15.5)
WBC: 6.8 10*3/uL (ref 4.0–10.5)

## 2020-02-13 LAB — COMPREHENSIVE METABOLIC PANEL
ALT: 59 U/L — ABNORMAL HIGH (ref 0–53)
AST: 40 U/L — ABNORMAL HIGH (ref 0–37)
Albumin: 2.6 g/dL — ABNORMAL LOW (ref 3.5–5.2)
Alkaline Phosphatase: 115 U/L (ref 39–117)
BUN: 81 mg/dL — ABNORMAL HIGH (ref 6–23)
CO2: 31 mEq/L (ref 19–32)
Calcium: 8.8 mg/dL (ref 8.4–10.5)
Chloride: 111 mEq/L (ref 96–112)
Creatinine, Ser: 3.24 mg/dL — ABNORMAL HIGH (ref 0.40–1.50)
GFR: 22.3 mL/min — ABNORMAL LOW (ref >60.00)
Glucose, Bld: 121 mg/dL — ABNORMAL HIGH (ref 70–99)
Potassium: 4.1 mEq/L (ref 3.5–5.1)
Sodium: 149 mEq/L — ABNORMAL HIGH (ref 135–145)
Total Bilirubin: 0.4 mg/dL (ref 0.2–1.2)
Total Protein: 5.4 g/dL — ABNORMAL LOW (ref 6.0–8.3)

## 2020-02-13 LAB — TSH: TSH: 2.42 u[IU]/mL (ref 0.35–4.50)

## 2020-02-13 LAB — BRAIN NATRIURETIC PEPTIDE: Pro B Natriuretic peptide (BNP): 84 pg/mL (ref 0.0–100.0)

## 2020-02-13 NOTE — Assessment & Plan Note (Addendum)
Chronic Improved Currently taking Lasix 20 mg daily He does wear compression socks daily and it helps He does not always elevate his legs-encouraged regular elevation of the legs when sitting CMP, BNP today-we will see if we need to adjust the Lasix

## 2020-02-13 NOTE — Assessment & Plan Note (Signed)
Acute Will refer for home health nursing help monitor Encouraged increasing intake-he is drinking Ensure or boost daily

## 2020-02-13 NOTE — Assessment & Plan Note (Signed)
Having some generalized abdominal pain, significant weight loss-30 pounds in the past month, has had no change in stools, blood in stool Strong concern for cancer CBC, CMP CKD abdomen and pelvis Further evaluation based on above

## 2020-02-13 NOTE — Assessment & Plan Note (Signed)
Acute Significant physical deconditioning generalized weakness Has had several falls Referral for home PT

## 2020-02-13 NOTE — Assessment & Plan Note (Signed)
Acute He has had a concerning amount of weight loss over the past 6 weeks-approximately 30 pounds.  Some of this may have been water weight from being on the Lasix, but this is still very concerning He has had some constipation, abdominal pain and I think and blood in the stool Concern for the possibility of colon cancer Last CT scan showed possible hypodense lesion in the pancreas CBC, CMP We will obtain CT of the abdomen and pelvis without contrast given his CKD.  He is unsure if he would be able to tolerate an MRI

## 2020-02-13 NOTE — Assessment & Plan Note (Signed)
Acute Experiencing significant fatigue We will check CBC, CMP Likely related to all of his other symptoms

## 2020-02-16 ENCOUNTER — Inpatient Hospital Stay: Admission: RE | Admit: 2020-02-16 | Payer: Medicare HMO | Source: Ambulatory Visit

## 2020-02-18 ENCOUNTER — Inpatient Hospital Stay: Admission: RE | Admit: 2020-02-18 | Payer: Medicare HMO | Source: Ambulatory Visit

## 2020-02-22 ENCOUNTER — Encounter (HOSPITAL_COMMUNITY): Payer: Self-pay

## 2020-02-22 ENCOUNTER — Other Ambulatory Visit: Payer: Self-pay

## 2020-02-22 ENCOUNTER — Emergency Department (HOSPITAL_COMMUNITY): Payer: Medicare HMO

## 2020-02-22 ENCOUNTER — Inpatient Hospital Stay (HOSPITAL_COMMUNITY)
Admission: EM | Admit: 2020-02-22 | Discharge: 2020-03-18 | DRG: 885 | Disposition: A | Discharge status: Hospice | Payer: Medicare HMO | Attending: Internal Medicine | Admitting: Internal Medicine

## 2020-02-22 DIAGNOSIS — R112 Nausea with vomiting, unspecified: Secondary | ICD-10-CM | POA: Diagnosis not present

## 2020-02-22 DIAGNOSIS — F331 Major depressive disorder, recurrent, moderate: Secondary | ICD-10-CM | POA: Diagnosis not present

## 2020-02-22 DIAGNOSIS — N170 Acute kidney failure with tubular necrosis: Secondary | ICD-10-CM | POA: Diagnosis present

## 2020-02-22 DIAGNOSIS — Z681 Body mass index (BMI) 19 or less, adult: Secondary | ICD-10-CM

## 2020-02-22 DIAGNOSIS — R110 Nausea: Secondary | ICD-10-CM | POA: Diagnosis not present

## 2020-02-22 DIAGNOSIS — R64 Cachexia: Secondary | ICD-10-CM | POA: Diagnosis present

## 2020-02-22 DIAGNOSIS — R630 Anorexia: Secondary | ICD-10-CM

## 2020-02-22 DIAGNOSIS — R0603 Acute respiratory distress: Secondary | ICD-10-CM | POA: Diagnosis not present

## 2020-02-22 DIAGNOSIS — I130 Hypertensive heart and chronic kidney disease with heart failure and stage 1 through stage 4 chronic kidney disease, or unspecified chronic kidney disease: Secondary | ICD-10-CM | POA: Diagnosis present

## 2020-02-22 DIAGNOSIS — N049 Nephrotic syndrome with unspecified morphologic changes: Secondary | ICD-10-CM

## 2020-02-22 DIAGNOSIS — Z8249 Family history of ischemic heart disease and other diseases of the circulatory system: Secondary | ICD-10-CM

## 2020-02-22 DIAGNOSIS — R2681 Unsteadiness on feet: Secondary | ICD-10-CM | POA: Diagnosis present

## 2020-02-22 DIAGNOSIS — R1312 Dysphagia, oropharyngeal phase: Secondary | ICD-10-CM | POA: Diagnosis present

## 2020-02-22 DIAGNOSIS — I700 Atherosclerosis of aorta: Secondary | ICD-10-CM | POA: Diagnosis present

## 2020-02-22 DIAGNOSIS — E861 Hypovolemia: Secondary | ICD-10-CM | POA: Diagnosis present

## 2020-02-22 DIAGNOSIS — N281 Cyst of kidney, acquired: Secondary | ICD-10-CM | POA: Diagnosis present

## 2020-02-22 DIAGNOSIS — E870 Hyperosmolality and hypernatremia: Secondary | ICD-10-CM | POA: Diagnosis not present

## 2020-02-22 DIAGNOSIS — Z515 Encounter for palliative care: Secondary | ICD-10-CM | POA: Diagnosis not present

## 2020-02-22 DIAGNOSIS — R451 Restlessness and agitation: Secondary | ICD-10-CM | POA: Diagnosis not present

## 2020-02-22 DIAGNOSIS — Z20822 Contact with and (suspected) exposure to covid-19: Secondary | ICD-10-CM | POA: Diagnosis present

## 2020-02-22 DIAGNOSIS — K862 Cyst of pancreas: Secondary | ICD-10-CM | POA: Diagnosis present

## 2020-02-22 DIAGNOSIS — Z8719 Personal history of other diseases of the digestive system: Secondary | ICD-10-CM

## 2020-02-22 DIAGNOSIS — R531 Weakness: Secondary | ICD-10-CM | POA: Diagnosis not present

## 2020-02-22 DIAGNOSIS — T43015A Adverse effect of tricyclic antidepressants, initial encounter: Secondary | ICD-10-CM | POA: Diagnosis present

## 2020-02-22 DIAGNOSIS — K2100 Gastro-esophageal reflux disease with esophagitis, without bleeding: Secondary | ICD-10-CM | POA: Diagnosis not present

## 2020-02-22 DIAGNOSIS — E860 Dehydration: Secondary | ICD-10-CM | POA: Diagnosis present

## 2020-02-22 DIAGNOSIS — Z79899 Other long term (current) drug therapy: Secondary | ICD-10-CM

## 2020-02-22 DIAGNOSIS — E785 Hyperlipidemia, unspecified: Secondary | ICD-10-CM | POA: Diagnosis present

## 2020-02-22 DIAGNOSIS — G4700 Insomnia, unspecified: Secondary | ICD-10-CM | POA: Diagnosis present

## 2020-02-22 DIAGNOSIS — R945 Abnormal results of liver function studies: Secondary | ICD-10-CM | POA: Diagnosis present

## 2020-02-22 DIAGNOSIS — Z87891 Personal history of nicotine dependence: Secondary | ICD-10-CM

## 2020-02-22 DIAGNOSIS — J449 Chronic obstructive pulmonary disease, unspecified: Secondary | ICD-10-CM | POA: Diagnosis present

## 2020-02-22 DIAGNOSIS — E872 Acidosis: Secondary | ICD-10-CM | POA: Diagnosis not present

## 2020-02-22 DIAGNOSIS — R7989 Other specified abnormal findings of blood chemistry: Secondary | ICD-10-CM

## 2020-02-22 DIAGNOSIS — D696 Thrombocytopenia, unspecified: Secondary | ICD-10-CM | POA: Diagnosis present

## 2020-02-22 DIAGNOSIS — R7401 Elevation of levels of liver transaminase levels: Secondary | ICD-10-CM | POA: Diagnosis not present

## 2020-02-22 DIAGNOSIS — Z66 Do not resuscitate: Secondary | ICD-10-CM | POA: Diagnosis not present

## 2020-02-22 DIAGNOSIS — Z801 Family history of malignant neoplasm of trachea, bronchus and lung: Secondary | ICD-10-CM

## 2020-02-22 DIAGNOSIS — R296 Repeated falls: Secondary | ICD-10-CM | POA: Diagnosis present

## 2020-02-22 DIAGNOSIS — D631 Anemia in chronic kidney disease: Secondary | ICD-10-CM | POA: Diagnosis not present

## 2020-02-22 DIAGNOSIS — E8581 Light chain (AL) amyloidosis: Secondary | ICD-10-CM | POA: Diagnosis present

## 2020-02-22 DIAGNOSIS — N179 Acute kidney failure, unspecified: Secondary | ICD-10-CM

## 2020-02-22 DIAGNOSIS — M87852 Other osteonecrosis, left femur: Secondary | ICD-10-CM | POA: Diagnosis present

## 2020-02-22 DIAGNOSIS — Z8371 Family history of colonic polyps: Secondary | ICD-10-CM

## 2020-02-22 DIAGNOSIS — N184 Chronic kidney disease, stage 4 (severe): Secondary | ICD-10-CM | POA: Diagnosis present

## 2020-02-22 DIAGNOSIS — R627 Adult failure to thrive: Secondary | ICD-10-CM | POA: Diagnosis not present

## 2020-02-22 DIAGNOSIS — I951 Orthostatic hypotension: Secondary | ICD-10-CM | POA: Diagnosis not present

## 2020-02-22 DIAGNOSIS — Z7982 Long term (current) use of aspirin: Secondary | ICD-10-CM

## 2020-02-22 DIAGNOSIS — R54 Age-related physical debility: Secondary | ICD-10-CM | POA: Diagnosis present

## 2020-02-22 DIAGNOSIS — R490 Dysphonia: Secondary | ICD-10-CM | POA: Diagnosis present

## 2020-02-22 DIAGNOSIS — F419 Anxiety disorder, unspecified: Secondary | ICD-10-CM | POA: Diagnosis not present

## 2020-02-22 DIAGNOSIS — E43 Unspecified severe protein-calorie malnutrition: Secondary | ICD-10-CM | POA: Insufficient documentation

## 2020-02-22 DIAGNOSIS — N400 Enlarged prostate without lower urinary tract symptoms: Secondary | ICD-10-CM | POA: Diagnosis present

## 2020-02-22 DIAGNOSIS — K2971 Gastritis, unspecified, with bleeding: Secondary | ICD-10-CM | POA: Diagnosis present

## 2020-02-22 DIAGNOSIS — M7989 Other specified soft tissue disorders: Secondary | ICD-10-CM | POA: Diagnosis not present

## 2020-02-22 DIAGNOSIS — K319 Disease of stomach and duodenum, unspecified: Secondary | ICD-10-CM | POA: Diagnosis not present

## 2020-02-22 DIAGNOSIS — I5032 Chronic diastolic (congestive) heart failure: Secondary | ICD-10-CM | POA: Diagnosis present

## 2020-02-22 DIAGNOSIS — N4289 Other specified disorders of prostate: Secondary | ICD-10-CM | POA: Diagnosis present

## 2020-02-22 DIAGNOSIS — M545 Low back pain: Secondary | ICD-10-CM | POA: Diagnosis present

## 2020-02-22 DIAGNOSIS — R0602 Shortness of breath: Secondary | ICD-10-CM

## 2020-02-22 LAB — URINALYSIS, ROUTINE W REFLEX MICROSCOPIC
Bacteria, UA: NONE SEEN
Bilirubin Urine: NEGATIVE
Glucose, UA: NEGATIVE mg/dL
Hgb urine dipstick: NEGATIVE
Ketones, ur: NEGATIVE mg/dL
Leukocytes,Ua: NEGATIVE
Nitrite: NEGATIVE
Protein, ur: >=300 mg/dL — AB
Specific Gravity, Urine: 1.014 (ref 1.005–1.030)
pH: 6 (ref 5.0–8.0)

## 2020-02-22 LAB — COMPREHENSIVE METABOLIC PANEL
ALT: 253 U/L — ABNORMAL HIGH (ref 0–44)
AST: 175 U/L — ABNORMAL HIGH (ref 15–41)
Albumin: 2.3 g/dL — ABNORMAL LOW (ref 3.5–5.0)
Alkaline Phosphatase: 161 U/L — ABNORMAL HIGH (ref 38–126)
Anion gap: 9 (ref 5–15)
BUN: 92 mg/dL — ABNORMAL HIGH (ref 8–23)
CO2: 27 mmol/L (ref 22–32)
Calcium: 8.6 mg/dL — ABNORMAL LOW (ref 8.9–10.3)
Chloride: 117 mmol/L — ABNORMAL HIGH (ref 98–111)
Creatinine, Ser: 3.15 mg/dL — ABNORMAL HIGH (ref 0.61–1.24)
GFR calc Af Amer: 20 mL/min — ABNORMAL LOW (ref >60)
GFR calc non Af Amer: 18 mL/min — ABNORMAL LOW (ref >60)
Glucose, Bld: 114 mg/dL — ABNORMAL HIGH (ref 70–99)
Potassium: 4.1 mmol/L (ref 3.5–5.1)
Sodium: 153 mmol/L — ABNORMAL HIGH (ref 135–145)
Total Bilirubin: 0.7 mg/dL (ref 0.3–1.2)
Total Protein: 4.9 g/dL — ABNORMAL LOW (ref 6.5–8.1)

## 2020-02-22 LAB — CBC
HCT: 37.4 % — ABNORMAL LOW (ref 39.0–52.0)
Hemoglobin: 12.1 g/dL — ABNORMAL LOW (ref 13.0–17.0)
MCH: 33.1 pg (ref 26.0–34.0)
MCHC: 32.4 g/dL (ref 30.0–36.0)
MCV: 102.2 fL — ABNORMAL HIGH (ref 80.0–100.0)
Platelets: 150 10*3/uL (ref 150–400)
RBC: 3.66 MIL/uL — ABNORMAL LOW (ref 4.22–5.81)
RDW: 14.2 % (ref 11.5–15.5)
WBC: 5 10*3/uL (ref 4.0–10.5)
nRBC: 0 % (ref 0.0–0.2)

## 2020-02-22 LAB — TSH: TSH: 2.428 u[IU]/mL (ref 0.350–4.500)

## 2020-02-22 LAB — PROTIME-INR
INR: 0.9 (ref 0.8–1.2)
Prothrombin Time: 12.4 seconds (ref 11.4–15.2)

## 2020-02-22 LAB — SALICYLATE LEVEL: Salicylate Lvl: <7 mg/dL — ABNORMAL LOW (ref 7.0–30.0)

## 2020-02-22 LAB — LACTIC ACID, PLASMA: Lactic Acid, Venous: 1.4 mmol/L (ref 0.5–1.9)

## 2020-02-22 LAB — LIPASE, BLOOD: Lipase: 20 U/L (ref 11–51)

## 2020-02-22 LAB — ACETAMINOPHEN LEVEL: Acetaminophen (Tylenol), Serum: <10 ug/mL — ABNORMAL LOW (ref 10–30)

## 2020-02-22 MED ORDER — SODIUM CHLORIDE 0.9 % IV BOLUS
1000.0000 mL | Freq: Once | INTRAVENOUS | Status: AC
  Administered 2020-02-22: 1000 mL via INTRAVENOUS

## 2020-02-22 MED ORDER — ONDANSETRON HCL 4 MG/2ML IJ SOLN
4.0000 mg | Freq: Once | INTRAMUSCULAR | Status: AC
  Administered 2020-02-22: 16:00:00 4 mg via INTRAVENOUS
  Filled 2020-02-22: qty 2

## 2020-02-22 MED ORDER — HEPARIN SODIUM (PORCINE) 5000 UNIT/ML IJ SOLN
5000.0000 [IU] | Freq: Three times a day (TID) | INTRAMUSCULAR | Status: DC
  Administered 2020-02-22 – 2020-03-10 (×39): 5000 [IU] via SUBCUTANEOUS
  Filled 2020-02-22 (×39): qty 1

## 2020-02-22 NOTE — Progress Notes (Signed)
This is for ED throughput tracking ONLY...Called for report, RN in Covid room, asked to call back in 15 minutes.

## 2020-02-22 NOTE — ED Provider Notes (Signed)
[facility] Emergency Department Provider Note MRN:  [id number]  Arrival date & time: 02/22/20     Chief Complaint   Failure To Thrive   History of Present Illness   Alvin Ingram is a 84 y.o. year-old male with a history of hypertension, hyperlipidemia presenting to the ED with chief complaint of failure to thrive.  No appetite for the past several days, feels weak and nauseated with food.  Occasional lower back pain but no bowel dysfunction, no urinary dysfunction.  No numbness or weakness, no chest pain or shortness of breath, no fever, no vomiting.  No diarrhea.  Review of Systems  A complete 10 system review of systems was obtained and all systems are negative except as noted in the HPI and PMH.   Patient's Health History    Past Medical History:  Diagnosis Date  . Cardiac murmur    Aortic  . Diverticulosis 2006  . GERD (gastroesophageal reflux disease)   . History of colonic polyps 2003,2011   Dr Earlean Shawl  . History of syncope 1966   ? stress related  . Hyperlipidemia   . Hypertension    hypertensive response @ stress test    Past Surgical History:  Procedure Laterality Date  . CATARACT EXTRACTION  2003 & 2011   Dr Charise Killian  . COLONOSCOPY  04/2005   tics, no polyps. Dr.Medoff  . COLONOSCOPY W/ POLYPECTOMY  2003, 2011   neg 2006; due 2016. Dr Earlean Shawl    Family History  Problem Relation Age of Onset  . Heart attack Mother 32  . Colon polyps Brother   . Pneumonia Brother   . Heart attack Brother 68  . Lung cancer Brother   . Other Father        Benign Brain Tumor  . Hypertension Brother   . Tuberculosis Paternal Uncle   . Other Maternal Grandmother   . Diabetes Neg Hx   . Stroke Neg Hx     Social History   Socioeconomic History  . Marital status: Single    Spouse name: Not on file  . Number of children: Not on file  . Years of education: Not on file  . Highest education level: Not on file  Occupational History  . Not on file    Tobacco Use  . Smoking status: Former Smoker    Quit date: 11/28/1967    Years since quitting: 52.2  . Smokeless tobacco: Never Used  . Tobacco comment: smoked 1955-1969 , up to 1 ppd  Substance and Sexual Activity  . Alcohol use: Yes    Comment: RARELY  . Drug use: No  . Sexual activity: Not Currently  Other Topics Concern  . Not on file  Social History Narrative  . Not on file   Social Determinants of Health   Financial Resource Strain:   . Difficulty of Paying Living Expenses:   Food Insecurity:   . Worried About Charity fundraiser in the Last Year:   . Arboriculturist in the Last Year:   Transportation Needs:   . Film/video editor (Medical):   Marland Kitchen Lack of Transportation (Non-Medical):   Physical Activity:   . Days of Exercise per Week:   . Minutes of Exercise per Session:   Stress:   . Feeling of Stress :   Social Connections:   . Frequency of Communication with Friends and Family:   . Frequency of Social Gatherings with Friends and Family:   . Attends Religious Services:   .  Active Member of Clubs or Organizations:   . Attends Archivist Meetings:   Marland Kitchen Marital Status:   Intimate Partner Violence:   . Fear of Current or Ex-Partner:   . Emotionally Abused:   Marland Kitchen Physically Abused:   . Sexually Abused:      Physical Exam   Vitals:   02/22/20 1900 02/22/20 2122  BP: (!) 153/80 123/86  Pulse: 63 74  Resp: (!) 23 18  Temp:  (!) 97.5 F (36.4 C)  SpO2: 98% 100%    CONSTITUTIONAL: Chronically ill-appearing, NAD, appears thin NEURO:  Alert and oriented x 3, no focal deficits EYES:  eyes equal and reactive ENT/NECK:  no LAD, no JVD CARDIO: Regular rate, well-perfused, normal S1 and S2 PULM:  CTAB no wheezing or rhonchi GI/GU:  normal bowel sounds, non-distended, non-tender MSK/SPINE:  No gross deformities, no edema SKIN:  no rash, atraumatic PSYCH:  Appropriate speech and behavior  *Additional and/or pertinent findings included in MDM  below  Diagnostic and Interventional Summary    EKG Interpretation  Date/Time:  Sunday February 22 2020 16:33:37 EDT Ventricular Rate:  65 PR Interval:    QRS Duration: 89 QT Interval:  478 QTC Calculation: 498 R Axis:   -13 Text Interpretation: Sinus rhythm Anterior infarct, old Nonspecific T abnormalities, lateral leads Confirmed by Gerlene Fee [phone]) on 02/22/2020 5:06:31 PM      Labs Reviewed  CBC - Abnormal; Notable for the following components:      Result Value   RBC 3.66 (*)    Hemoglobin 12.1 (*)    HCT 37.4 (*)    MCV 102.2 (*)    All other components within normal limits  COMPREHENSIVE METABOLIC PANEL - Abnormal; Notable for the following components:   Sodium 153 (*)    Chloride 117 (*)    Glucose, Bld 114 (*)    BUN 92 (*)    Creatinine, Ser 3.15 (*)    Calcium 8.6 (*)    Total Protein 4.9 (*)    Albumin 2.3 (*)    AST 175 (*)    ALT 253 (*)    Alkaline Phosphatase 161 (*)    GFR calc non Af Amer 18 (*)    GFR calc Af Amer 20 (*)    All other components within normal limits  URINALYSIS, ROUTINE W REFLEX MICROSCOPIC - Abnormal; Notable for the following components:   Protein, ur >=300 (*)    All other components within normal limits  SARS CORONAVIRUS 2 (TAT 6-24 HRS)  LIPASE, BLOOD  LACTIC ACID, PLASMA  TSH  PROTIME-INR  BASIC METABOLIC PANEL  CBC  HEPATITIS PANEL, ACUTE  ACETAMINOPHEN LEVEL  SALICYLATE LEVEL    US Abdomen Limited RUQ  Final Result    DG ABD ACUTE 2+V W 1V CHEST  Final Result      Medications  heparin injection 5,000 Units (has no administration in time range)  sodium chloride 0.9 % bolus 1,000 mL (0 mLs Intravenous Stopped 02/22/20 1904)  ondansetron (ZOFRAN) injection 4 mg (4 mg Intravenous Given 02/22/20 1624)     Procedures  /  Critical Care Procedures  ED Course and Medical Decision Making  I have reviewed the triage vital signs, the nursing notes, and pertinent available records from the EMR.  Pertinent labs &  imaging results that were available during my care of the patient were reviewed by me and considered in my medical decision making (see below for details).     Failure to thrive, question  dehydration.  Similar presentation a few months ago with CT imaging revealing no significant process.   X-ray and ultrasound are without acute concerns, labs are revealing worsening kidney function with elevated LFTs.  Admitted to hospital service for further care.  Barth Kirks. Sedonia Small, MD [place] [email or web address]  Final Clinical Impressions(s) / ED Diagnoses     ICD-10-CM   1. Anorexia  R63.0 DG ABD ACUTE 2+V W 1V CHEST    DG ABD ACUTE 2+V W 1V CHEST  2. LFT elevation  R79.89 US Abdomen Limited RUQ    US Abdomen Limited RUQ    ED Discharge Orders    None       Discharge Instructions Discussed with and Provided to Patient:   Discharge Instructions   None       Maudie Flakes, MD 02/22/20 2205

## 2020-02-22 NOTE — H&P (Signed)
History and Physical    Alvin Ingram HMC:[id number] DOB: 31-Dec-1935 DOA: 02/22/2020  PCP: Binnie Rail, MD  Patient coming from: Home, lives alone  I have personally briefly reviewed patient's old medical records in [place]  Chief Complaint: decrease appetite, weakness  HPI: Alvin Ingram is a 84 y.o. male with medical history significant for BPH, hyperlipidemia and GERD who presents with concerns of decreased p.o. intake and weakness. Patient overall not a good historian on account timeline.  He reports that family advised him to present to the ED but they have been concerned about his decreased appetite.  He is still able to tolerate some fruit and cereal but has not had much appetite with a more balanced meal for the past several weeks.  He is also noticed nausea and some occasional vomiting that might be worse with food.  He also occasionally notices abdominal pain.  Lower quadrant.  When he urinates he thinks he can hear his "stomach sloshing around."  He notes occasional rare dysuria.  He says that he has seen bright red blood once in a while when he wipes due to bowel movement.  Denies black tarry stool.  He denies any fever.  No night sweats.  Thinks he has lost about 25 pounds in the past several weeks.   He also noticed more balance issues and is afraid that he will fall whenever he gets up to urinate.  He feels that his right side is stronger than his left side.  Has been evaluated by his PCP about 2 weeks ago and had CBC showing mildly elevated AST of 40 and ALT of 59.  He also was noted to have a CT of his abdomen back in February that showed extensive sigmoid diverticulosis.  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  There was a recommendation for further evaluation with MRI with and without contrast on an outpatient basis.  Patient is a former tobacco user but quit about 40 years ago.  He  denies alcohol or illicit drug use. Family history includes a brother with lung cancer but no other abdominal related cancer that he knows of.   ED Course:   He was afebrile and normotensive on room air.  CBC showed no leukocytosis and had mild anemia with hemoglobin of 12.1.  Hyponatremia of 153, glucose 114, creatinine of 3.15 which is progressively increased from prior around 2.  His LFTs are now significantly more elevated than his prior outpatient labs with AST of 175 and ALT of 253 and alkaline phosphatase of 161.  TSH is normal.  Right upper quadrant ultrasound obtained was negative.  Review of Systems:  Constitutional: + Weight Change, No Fever ENT/Mouth: No sore throat, No Rhinorrhea Eyes: No Eye Pain, No Vision Changes Cardiovascular: No Chest Pain, no SOB Respiratory: No Cough, + Sputum, No Wheezing, no Dyspnea  Gastrointestinal: + Nausea, + Vomiting, No Diarrhea, No Constipation, + Pain Genitourinary: no Urinary Incontinence, No Urgency, No Flank Pain Musculoskeletal: No Arthralgias, No Myalgias Skin: No Skin Lesions, No Pruritus, Neuro: + Weakness, No Numbness Psych: No Anxiety/Panic, No Depression, + decrease appetite Heme/Lymph: No Bruising, No Bleeding  Past Medical History:  Diagnosis Date  . Cardiac murmur    Aortic  . Diverticulosis 2006  . GERD (gastroesophageal reflux disease)   . History of colonic polyps 2003,2011   Dr Earlean Shawl  . History of syncope 1966   ?  stress related  . Hyperlipidemia   . Hypertension    hypertensive response @ stress test    Past Surgical History:  Procedure Laterality Date  . CATARACT EXTRACTION  2003 & 2011   Dr Charise Killian  . COLONOSCOPY  04/2005   tics, no polyps. Dr.Medoff  . COLONOSCOPY W/ POLYPECTOMY  2003, 2011   neg 2006; due 2016. Dr Earlean Shawl     reports that he quit smoking about 52 years ago. He has never used smokeless tobacco. He reports current alcohol use. He reports that he does not use drugs.  No Known  Allergies  Family History  Problem Relation Age of Onset  . Heart attack Mother 48  . Colon polyps Brother   . Pneumonia Brother   . Heart attack Brother 72  . Lung cancer Brother   . Other Father        Benign Brain Tumor  . Hypertension Brother   . Tuberculosis Paternal Uncle   . Other Maternal Grandmother   . Diabetes Neg Hx   . Stroke Neg Hx      Prior to Admission medications   Medication Sig Start Date End Date Taking? Authorizing Provider  amitriptyline (ELAVIL) 25 MG tablet Take 25 mg by mouth at bedtime.   Yes [provider]  aspirin 81 MG tablet Take 81 mg by mouth daily.     Yes [provider]  Multiple Vitamins-Minerals (CENTRUM SILVER PO) Take 1 tablet by mouth daily.    Yes [provider]  omeprazole (PRILOSEC) 20 MG capsule Take 1 capsule by mouth once daily Patient taking differently: Take 20 mg by mouth daily.  08/25/19  Yes Burns, Claudina Lick, MD  polyethylene glycol (MIRALAX / GLYCOLAX) 17 g packet Take 17 g by mouth daily. 12/20/19  Yes Lucky Cowboy, MD  senna (SENOKOT) 8.6 MG TABS tablet Take 1 tablet (8.6 mg total) by mouth daily. Patient taking differently: Take 1 tablet by mouth daily as needed for mild constipation.  12/20/19  Yes Lucky Cowboy, MD  simvastatin (ZOCOR) 20 MG tablet TAKE 1 TABLET BY MOUTH AT BEDTIME Patient taking differently: Take 20 mg by mouth at bedtime.  10/27/19  Yes Burns, Claudina Lick, MD  tamsulosin (FLOMAX) 0.4 MG CAPS capsule Take 0.4 mg by mouth daily after supper.    Yes [provider]  furosemide (LASIX) 40 MG tablet Take 1 tablet by mouth once daily Patient not taking: Reported on 02/22/2020 01/21/20   Binnie Rail, MD    Physical Exam: Vitals:   02/22/20 1522 02/22/20 1523 02/22/20 1703 02/22/20 1900  BP: 117/72  132/80 (!) 153/80  Pulse: 74  79 63  Resp: 15  13 (!) 23  Temp: 97.8 F (36.6 C)     TempSrc: Oral     SpO2: 100%  100% 98%  Weight:  62.1 kg    Height:  6' 1.5" (1.867 m)       Constitutional: NAD, calm, comfortable, thin cachectic appearing male laying in bed.  Patient occasionally will have the urge to spit up [place] sputum. Vitals:   02/22/20 1522 02/22/20 1523 02/22/20 1703 02/22/20 1900  BP: 117/72  132/80 (!) 153/80  Pulse: 74  79 63  Resp: 15  13 (!) 23  Temp: 97.8 F (36.6 C)     TempSrc: Oral     SpO2: 100%  100% 98%  Weight:  62.1 kg    Height:  6' 1.5" (1.867 m)     Eyes:  PERRL, lids and conjunctivae normal ENMT: Mucous membranes are moist.  Neck: normal, supple Respiratory: clear to auscultation bilaterally, no wheezing, no crackles. Normal respiratory effort on room air. No accessory muscle use.  Cardiovascular: Regular rate and rhythm, no murmurs / rubs / gallops. No extremity edema. 2+ pedal pulses. No carotid bruits.  Abdomen: no tenderness, no masses palpated.  Bowel sounds positive.  Musculoskeletal: no clubbing / cyanosis. No joint deformity upper and lower extremities. Good ROM, no contractures. Normal muscle tone.  Skin: no rashes, lesions, ulcers. No induration Neurologic: CN 2-12 grossly intact. Sensation intact. Strength 5/5 in all 4.  Psychiatric: Normal judgment and insight. Alert and oriented x 3. Normal mood.     Labs on Admission: I have personally reviewed following labs and imaging studies  CBC: Recent Labs  Lab 02/22/20 1540  WBC 5.0  HGB 12.1*  HCT 37.4*  MCV 102.2*  PLT 093   Basic Metabolic Panel: Recent Labs  Lab 02/22/20 1540  NA 153*  K 4.1  CL 117*  CO2 27  GLUCOSE 114*  BUN 92*  CREATININE 3.15*  CALCIUM 8.6*   GFR: Estimated Creatinine Clearance: 16.2 mL/min (A) (by C-G formula based on SCr of 3.15 mg/dL (H)). Liver Function Tests: Recent Labs  Lab 02/22/20 1540  AST 175*  ALT 253*  ALKPHOS 161*  BILITOT 0.7  PROT 4.9*  ALBUMIN 2.3*   Recent Labs  Lab 02/22/20 1540  LIPASE 20   No results for input(s): AMMONIA in the last 168 hours. Coagulation Profile: No results for  input(s): INR, PROTIME in the last 168 hours. Cardiac Enzymes: No results for input(s): CKTOTAL, CKMB, CKMBINDEX, TROPONINI in the last 168 hours. BNP (last 3 results) Recent Labs    12/29/19 1639 02/13/20 1431  PROBNP 224.0* 84.0   HbA1C: No results for input(s): HGBA1C in the last 72 hours. CBG: No results for input(s): GLUCAP in the last 168 hours. Lipid Profile: No results for input(s): CHOL, HDL, LDLCALC, TRIG, CHOLHDL, LDLDIRECT in the last 72 hours. Thyroid Function Tests: Recent Labs    02/22/20 1541  TSH 2.428   Anemia Panel: No results for input(s): VITAMINB12, FOLATE, FERRITIN, TIBC, IRON, RETICCTPCT in the last 72 hours. Urine analysis:    Component Value Date/Time   COLORURINE YELLOW 02/22/2020 1540   APPEARANCEUR CLEAR 02/22/2020 1540   LABSPEC 1.014 02/22/2020 1540   PHURINE 6.0 02/22/2020 1540   GLUCOSEU NEGATIVE 02/22/2020 1540   GLUCOSEU NEGATIVE 01/02/2020 1028   HGBUR NEGATIVE 02/22/2020 1540   BILIRUBINUR NEGATIVE 02/22/2020 [place] 02/22/2020 1540   PROTEINUR >=300 (A) 02/22/2020 1540   UROBILINOGEN 0.2 01/02/2020 1028   NITRITE NEGATIVE 02/22/2020 1540   LEUKOCYTESUR NEGATIVE 02/22/2020 1540    Radiological Exams on Admission: DG ABD ACUTE 2+V W 1V CHEST  Result Date: 02/22/2020 CLINICAL DATA:  Nausea with eating. EXAM: DG ABDOMEN ACUTE W/ 1V CHEST COMPARISON:  CT abdomen pelvis dated 01/02/2020. FINDINGS: There is no evidence of dilated bowel loops or free intraperitoneal air. No radiopaque calculi or other significant radiographic abnormality is seen. Heart size and mediastinal contours are within normal limits. The lungs are hyperinflated. Both lungs are clear. IMPRESSION: Negative abdominal radiographs. No acute cardiopulmonary disease. Hyperinflated lungs likely reflect chronic obstructive pulmonary disease. Electronically Signed   By: Zerita Boers M.D.   On: 02/22/2020 17:34   US Abdomen Limited RUQ  Result Date:  02/22/2020 CLINICAL DATA:  Elevated LFTs, nausea EXAM: ULTRASOUND ABDOMEN LIMITED RIGHT UPPER QUADRANT COMPARISON:  01/02/2020, 05/02/2007 FINDINGS: Gallbladder: No gallstones or wall thickening visualized. No sonographic Murphy sign noted by sonographer. Common bile duct: Diameter: 3 mm Liver: No focal lesion identified. Within normal limits in parenchymal echogenicity. Portal vein is patent on color Doppler imaging with normal direction of blood flow towards the liver. Other: None. IMPRESSION: 1. Unremarkable right upper quadrant ultrasound. Electronically Signed   By: Randa Ngo M.D.   On: 02/22/2020 17:56    EKG: Independently reviewed.  Assessment/Plan  Transaminitis in the setting of failure to thrive/decreased p.o. intake Symptoms are concerning for malignancy although he has had previous CT abdomen pelvis otherwise unremarkable except for a questionable indeterminate hypodense lesion in the distal body of the pancreas.  Could consider GI consult regarding this depending on results of his further lab work.  An MRI with and without contrast was recommended but patient has acute on chronic kidney disease at this moment that would preclude him from getting contrast. Will obtain hepatitis panel, Tylenol and salicylate level Obtain PT/INR Continue to trend LFTs Consult dietician Hold statin  Weakness/unsteady gait Patient endorsed a stronger right side versus left but appears to be equal on exam We will obtain PT evaluation  Hypernatremia Likely due to decreased oral intake.  Will follow after getting fluids  Acute kidney injury Patient had a previous normal baseline of around 1.446 months ago.  Now is significantly elevated up to 3.15 We will follow after fluids Avoid nephrotoxic agent  DVT prophylaxis: Heparin SQ Code Status: Full Family Communication: Plan discussed with patient at bedside  disposition Plan: Home with observation Consults called:  Admission status:  Observation   Wynonia Medero T Lyvonne Cassell DO [facility]   If 7PM-7AM, please contact night-coverage [email or web address]   02/22/2020, 7:29 PM

## 2020-02-22 NOTE — ED Triage Notes (Signed)
Pt to Ed by PTAR from home with c/o not eating or drinking for the past few days. When the patient does eat, patient is Nauseated and unable to keep anything down. Pt is A&Ox4.    PTAR Vitals 52 15-16 102 palpitated 97% RA CBG 130

## 2020-02-23 ENCOUNTER — Inpatient Hospital Stay: Admission: RE | Admit: 2020-02-23 | Payer: Medicare HMO | Source: Ambulatory Visit

## 2020-02-23 DIAGNOSIS — R531 Weakness: Secondary | ICD-10-CM

## 2020-02-23 DIAGNOSIS — R627 Adult failure to thrive: Secondary | ICD-10-CM | POA: Diagnosis not present

## 2020-02-23 DIAGNOSIS — E870 Hyperosmolality and hypernatremia: Secondary | ICD-10-CM

## 2020-02-23 DIAGNOSIS — R7401 Elevation of levels of liver transaminase levels: Secondary | ICD-10-CM

## 2020-02-23 LAB — BASIC METABOLIC PANEL
Anion gap: 10 (ref 5–15)
Anion gap: 9 (ref 5–15)
BUN: 89 mg/dL — ABNORMAL HIGH (ref 8–23)
BUN: 90 mg/dL — ABNORMAL HIGH (ref 8–23)
CO2: 23 mmol/L (ref 22–32)
CO2: 24 mmol/L (ref 22–32)
Calcium: 8.5 mg/dL — ABNORMAL LOW (ref 8.9–10.3)
Calcium: 8.3 mg/dL — ABNORMAL LOW (ref 8.9–10.3)
Chloride: 121 mmol/L — ABNORMAL HIGH (ref 98–111)
Chloride: 120 mmol/L — ABNORMAL HIGH (ref 98–111)
Creatinine, Ser: 2.99 mg/dL — ABNORMAL HIGH (ref 0.61–1.24)
Creatinine, Ser: 3.23 mg/dL — ABNORMAL HIGH (ref 0.61–1.24)
GFR calc Af Amer: 22 mL/min — ABNORMAL LOW (ref >60)
GFR calc Af Amer: 20 mL/min — ABNORMAL LOW (ref >60)
GFR calc non Af Amer: 19 mL/min — ABNORMAL LOW (ref >60)
GFR calc non Af Amer: 17 mL/min — ABNORMAL LOW (ref >60)
Glucose, Bld: 91 mg/dL (ref 70–99)
Glucose, Bld: 153 mg/dL — ABNORMAL HIGH (ref 70–99)
Potassium: 4.1 mmol/L (ref 3.5–5.1)
Potassium: 4.1 mmol/L (ref 3.5–5.1)
Sodium: 154 mmol/L — ABNORMAL HIGH (ref 135–145)
Sodium: 153 mmol/L — ABNORMAL HIGH (ref 135–145)

## 2020-02-23 LAB — HEPATITIS PANEL, ACUTE
HCV Ab: NONREACTIVE
Hep A IgM: NONREACTIVE
Hep B C IgM: NONREACTIVE
Hepatitis B Surface Ag: NONREACTIVE

## 2020-02-23 LAB — CBC
HCT: 34.2 % — ABNORMAL LOW (ref 39.0–52.0)
Hemoglobin: 11.1 g/dL — ABNORMAL LOW (ref 13.0–17.0)
MCH: 33.5 pg (ref 26.0–34.0)
MCHC: 32.5 g/dL (ref 30.0–36.0)
MCV: 103.3 fL — ABNORMAL HIGH (ref 80.0–100.0)
Platelets: 151 10*3/uL (ref 150–400)
RBC: 3.31 MIL/uL — ABNORMAL LOW (ref 4.22–5.81)
RDW: 14.4 % (ref 11.5–15.5)
WBC: 5.5 10*3/uL (ref 4.0–10.5)
nRBC: 0 % (ref 0.0–0.2)

## 2020-02-23 LAB — SARS CORONAVIRUS 2 (TAT 6-24 HRS): SARS Coronavirus 2: NEGATIVE

## 2020-02-23 MED ORDER — ASPIRIN EC 81 MG PO TBEC
81.0000 mg | DELAYED_RELEASE_TABLET | Freq: Every day | ORAL | Status: DC
  Administered 2020-02-23 – 2020-03-16 (×19): 81 mg via ORAL
  Filled 2020-02-23 (×20): qty 1

## 2020-02-23 MED ORDER — SODIUM CHLORIDE 0.45 % IV SOLN: INTRAVENOUS | Status: DC

## 2020-02-23 MED ORDER — ENSURE ENLIVE PO LIQD
237.0000 mL | Freq: Two times a day (BID) | ORAL | Status: DC
  Administered 2020-02-23 – 2020-03-18 (×24): 237 mL via ORAL

## 2020-02-23 MED ORDER — TAMSULOSIN HCL 0.4 MG PO CAPS
0.4000 mg | ORAL_CAPSULE | Freq: Every day | ORAL | Status: DC
  Administered 2020-02-23 – 2020-03-17 (×22): 0.4 mg via ORAL
  Filled 2020-02-23 (×23): qty 1

## 2020-02-23 MED ORDER — ADULT MULTIVITAMIN W/MINERALS CH
1.0000 | ORAL_TABLET | Freq: Every day | ORAL | Status: DC
  Administered 2020-02-23 – 2020-03-18 (×20): 1 via ORAL
  Filled 2020-02-23 (×22): qty 1

## 2020-02-23 MED ORDER — ONDANSETRON HCL 4 MG/2ML IJ SOLN
4.0000 mg | Freq: Four times a day (QID) | INTRAMUSCULAR | Status: DC | PRN
  Administered 2020-02-23 – 2020-03-13 (×10): 4 mg via INTRAVENOUS
  Filled 2020-02-23 (×11): qty 2

## 2020-02-23 MED ORDER — PANTOPRAZOLE SODIUM 40 MG PO TBEC
40.0000 mg | DELAYED_RELEASE_TABLET | Freq: Every day | ORAL | Status: DC
  Administered 2020-02-23 – 2020-03-01 (×7): 40 mg via ORAL
  Filled 2020-02-23 (×7): qty 1

## 2020-02-23 MED ORDER — AMITRIPTYLINE HCL 25 MG PO TABS
25.0000 mg | ORAL_TABLET | Freq: Every day | ORAL | Status: DC
  Administered 2020-02-23 – 2020-02-24 (×2): 25 mg via ORAL
  Filled 2020-02-23 (×2): qty 1

## 2020-02-23 NOTE — Progress Notes (Signed)
Initial Nutrition Assessment  DOCUMENTATION CODES:   Severe malnutrition in context of acute illness/injury, Underweight  INTERVENTION:  - continue Ensure Enlive BID, each supplement provides 350 kcal and 20 grams of protein. - will order Magic Cup BID with meals, each supplement provides 290 kcal and 9 grams of protein. - will order daily multivitamin with minerals. - continue to encourage PO intakes.    NUTRITION DIAGNOSIS:   Severe Malnutrition related to acute illness(unknown etiology) as evidenced by moderate fat depletion, moderate muscle depletion, severe muscle depletion, percent weight loss.  GOAL:   Patient will meet greater than or equal to 90% of their needs  MONITOR:   PO intake, Supplement acceptance, Labs, Weight trends  REASON FOR ASSESSMENT:   Consult Assessment of nutrition requirement/status  ASSESSMENT:   84 y.o. male with medical history of BPH, hyperlipidemia, and GERD who presented to the ED with concerns of decreased p.o. intake and weakness. He is not a good historian overall. He is able to tolerate some fruit and cereal but has had a poor appetite and has not had a balanced meal for several weeks. He reported nausea and occasional vomiting that may be worse with food intake. He sometimes notices abdominal pain. He thinks he can hear his "stomach sloshing around" when he gets up to use the restroom. He reported losing 25 lb in the couple of weeks PTA. He also reported issues with balance and feeling like he is going to fall over when he stands up. Family history includes a brother with lung cancer but no other abdominal related cancer of which he is aware.  No intakes documented since admission. Patient was napping at the time of RD visit. He awoke to name call x1, but overall conversation was relatively short as patient desired to return to napping as he was feeling weak and tired.   He did not eat anything today. He drank part of a bottle of Ensure,  which he likes, and some water. He had some slight nausea earlier in the day which has now resolved. No abdominal pain/pressure today. He feels that the items he drank are now sloshing in his stomach. He confirms that this and poor PO intakes were occurring PTA, but he is unable to give further detail.  He has a niece and a nephew who live in the area and several other family members who live in the state who often check in on him and who have been calling to check on him since admission. One of his brothers cooks very well and sometimes visits him and will make whatever patient wants, if he is feeling up to eating.   No further information able to be obtained at this time. H&P indicates that patient reported losing 25 lb in the few weeks PTA. Per chart review, weight yesterday was 137 lb and weight on 2/5 was 171 lb. This indicates 34 lb weight loss (20% body weight) in ~2 months.   Per notes: - FTT in an adult - AKI on stage 3 CKD - dehydration on admission - acute transaminitis  - hypovolemic hypernatremia--IV fluids ordered - ambulatory dysfunction   Labs reviewed; Na: 154 mmol/l, Cl: 121 mmol/l, BUN: 89 mg/dl, creatinine: 2.99 mg/dl, Ca:8.5 mg/dl, GFR: 22 ml/min. Medications reviewed. IVF; 1/2 NS @ 50 ml/hr.    NUTRITION - FOCUSED PHYSICAL EXAM:    Most Recent Value  Orbital Region  Moderate depletion  Upper Arm Region  Moderate depletion  Thoracic and Lumbar Region  Unable to  assess  Buccal Region  Severe depletion  Temple Region  Severe depletion  Clavicle Bone Region  Moderate depletion  Clavicle and Acromion Bone Region  Severe depletion  Scapular Bone Region  Moderate depletion  Dorsal Hand  Moderate depletion  Patellar Region  Unable to assess  Anterior Thigh Region  Unable to assess  Posterior Calf Region  Unable to assess  Edema (RD Assessment)  Unable to assess  Hair  Reviewed  Eyes  Reviewed  Mouth  Reviewed  Skin  Reviewed  Nails  Reviewed       Diet  Order:   Diet Order            Diet Heart Room service appropriate? Yes; Fluid consistency: Thin  Diet effective now              EDUCATION NEEDS:   No education needs have been identified at this time  Skin:  Skin Assessment: Reviewed RN Assessment  Last BM:  PTA/unknown  Height:   Ht Readings from Last 1 Encounters:  02/22/20 6\' 1"  (1.854 m)    Weight:   Wt Readings from Last 1 Encounters:  02/22/20 62.3 kg    Ideal Body Weight:  83.6 kg  BMI:  Body mass index is 18.12 kg/m.  Estimated Nutritional Needs:   Kcal:  1870-2055 kcal  Protein:  80-95 grams  Fluid:  >/= 2 L/day     Jarome Matin, MS, RD, LDN, CNSC Inpatient Clinical Dietitian RD pager # available in AMION  After hours/weekend pager # available in [facility]

## 2020-02-23 NOTE — Progress Notes (Addendum)
Dr listed is not on AMION; notified Dr C. Hall via page &spoke to Dr Johnette Abraham. Rodena Piety who was on the unit. Pt was assisted to bathroom using a walker by the NT. Previously the patient had been assisted to BR using the walker with one assist. According to the NT, the patient missed the toilet seat when he aimed for the toilet seat. VSS, SZP & Post fall huddle form filled out.

## 2020-02-23 NOTE — Evaluation (Signed)
Physical Therapy Evaluation Patient Details Name: Alvin Ingram MRN: [id number] DOB: Apr 19, 1936 Today's Date: 02/23/2020   History of Present Illness  Alvin Ingram is a 84 y.o. male with medical history significant for BPH, hyperlipidemia and GERD who presents with concerns of decreased p.o. intake and weakness.  Clinical Impression  Pt evaluation limited by continued nose bleed and coughing up blood clots. RN aware. Continue PT for mobility assessment and Dc planning as patient is able.Pt admitted with above diagnosis. Pt currently with functional limitations due to the deficits listed below (see PT Problem List). Pt will benefit from skilled PT to increase their independence and safety with mobility to allow discharge to the venue listed below.       Follow Up Recommendations Home health PT(unsure)    Equipment Recommendations  Rolling walker with 5" wheels    Recommendations for Other Services       Precautions / Restrictions Precautions Precautions: Fall Precaution Comments: has  nose bleed and coughing up blood      Mobility  Bed Mobility Overal bed mobility: Independent                Transfers                 General transfer comment: attempted to sit up, nose bleeding and coughing up large clots. RN in room  Ambulation/Gait             General Gait Details: TBA  Stairs            Wheelchair Mobility    Modified Rankin (Stroke Patients Only)       Balance Overall balance assessment: Needs assistance Sitting-balance support: No upper extremity supported;Feet unsupported Sitting balance-Leahy Scale: Good                                       Pertinent Vitals/Pain      Home Living Family/patient expects to be discharged to:: Private residence Living Arrangements: Alone Available Help at Discharge: (unsure) Type of Home: House Home Access: Stairs to enter   [employer] of Steps: 2 Home Layout: One  level Home Equipment: None Additional Comments: info from previous encounter, patient vague and not answering specific questions regarding hishome    Prior Function Level of Independence: Independent               Hand Dominance        Extremity/Trunk Assessment   Upper Extremity Assessment Upper Extremity Assessment: Generalized weakness    Lower Extremity Assessment Lower Extremity Assessment: Generalized weakness    Cervical / Trunk Assessment Cervical / Trunk Assessment: Normal  Communication   Communication: No difficulties  Cognition Arousal/Alertness: Awake/alert Behavior During Therapy: Flat affect Overall Cognitive Status: Difficult to assess                                 General Comments: patient would not answer questions      General Comments      Exercises     Assessment/Plan    PT Assessment Patient needs continued PT services  PT Problem List Decreased strength       PT Treatment Interventions      PT Goals (Current goals can be found in the Care Plan section)  Acute Rehab PT Goals Patient Stated Goal: agreed to try to get  up PT Goal Formulation: With patient Time For Goal Achievement: 03/08/20 Potential to Achieve Goals: Fair    Frequency     Barriers to discharge        Co-evaluation               AM-PAC PT "6 Clicks" Mobility  Outcome Measure Help needed turning from your back to your side while in a flat bed without using bedrails?: None Help needed moving from lying on your back to sitting on the side of a flat bed without using bedrails?: None Help needed moving to and from a bed to a chair (including a wheelchair)?: A Little Help needed standing up from a chair using your arms (e.g., wheelchair or bedside chair)?: A Little Help needed to walk in hospital room?: A Little Help needed climbing 3-5 steps with a railing? : A Lot 6 Click Score: 19    End of Session   Activity Tolerance: Treatment  limited secondary to medical complications (Comment)(nose bleed. bloody emsis) Patient left: in bed;with call bell/phone within reach Nurse Communication: Mobility status PT Visit Diagnosis: Unsteadiness on feet (R26.81);Muscle weakness (generalized) (M62.81);Difficulty in walking, not elsewhere classified (R26.2)    Time: 4039-7953 PT Time Calculation (min) (ACUTE ONLY): 10 min   Charges:   PT Evaluation $PT Eval Low Complexity: [place] PT Acute Rehabilitation Services Pager [phone] Office [phone]   Claretha Cooper 02/23/2020, 12:54 PM

## 2020-02-23 NOTE — Progress Notes (Addendum)
PROGRESS NOTE  Alvin Ingram EHM:[id number] DOB: March 01, 1936 DOA: 02/22/2020 PCP: Binnie Rail, MD  HPI/Recap of past 24 hours: Alvin Ingram is a 84 y.o. male with medical history significant for BPH, hyperlipidemia and GERD who presents with concerns of decreased p.o. intake and weakness. Patient overall not a good historian on account timeline.  He reports that family advised him to present to the ED but they have been concerned about his decreased appetite.  He is still able to tolerate some fruit and cereal but has not had much appetite with a more balanced meal for the past several weeks.  He is also noticed nausea and some occasional vomiting that might be worse with food.  He also occasionally notices abdominal pain.  Lower quadrant.  When he urinates he thinks he can hear his "stomach sloshing around."  He notes occasional rare dysuria.  He says that he has seen bright red blood once in a while when he wipes due to bowel movement.  Denies black tarry stool.  He denies any fever.  No night sweats.  Thinks he has lost about 25 pounds in the past several weeks.   He also noticed more balance issues and is afraid that he will fall whenever he gets up to urinate.  He feels that his right side is stronger than his left side.  Has been evaluated by his PCP about 2 weeks ago and had CBC showing mildly elevated AST of 40 and ALT of 59.  He also was noted to have a CT of his abdomen back in February that showed extensive sigmoid diverticulosis.  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  There was a recommendation for further evaluation with MRI with and without contrast on an outpatient basis.  Patient is a former tobacco user but quit about 40 years ago.  He denies alcohol or illicit drug use. Family history includes a brother with lung cancer but no other abdominal related cancer that he knows of.  Elevated  serum sodium, cr with AKI, prerenal in the setting of dehydration with poor oral intake.  02/23/20: Seen and examined.  Reports poor appetite with intermittent nausea with no vomiting.  Assessment/Plan: Principal Problem:   Failure to thrive in adult Active Problems:   AKI (acute kidney injury) ([place])   Transaminitis   Weakness   Hypernatremia   Failure to thrive in adult Presented with poor oral intake, poor appetite weight loss BMI 18 with evidence of muscle mass loss Dehydrated with electrolyte abnormalities, being repleted with IV hydration Dietary consult Encourage increasing protein calorie intake.  AKI on CKD 3A Baseline creatinine appears to be 1.8 with GFR of 42 Presented with creatinine 3.1 with GFR of 20 Creatinine trending down on IV fluid Monitor urine output Avoid nephrotoxins, hypotension and dehydration Repeat BMP in the morning  Acute transaminitis Unclear etiology Elevated alkaline phosphatase, AST and ALT T bili normal Right upper quadrant abdominal ultrasound negative Acute hepatitis panel negative Avoid hepatotoxic agents Repeat CMP in the morning  Hypovolemic hypernatremia Presented with serum sodium 153 Started on 1/2 normal saline Continue to monitor serum sodium If no improvement switch to D5 half normal saline Repeat BMP this afternoon  Chronic diastolic CHF Hypervolemic on exam Last 2D echo done on 12/17/2019 showed LVEF 65-70% With grade 1 diastolic dysfunction Closely monitor volume status while on IV fluid  GERD Continue p.o. Protonix  BPH  Continue Flomax  Ambulatory dysfunction [name] in the bathroom this morning, denies hitting his head Denies feeling dizzy prior to falling PT OT consult Fall precautions   DVT prophylaxis: Heparin SQ 3 times daily Code Status: Full Family Communication: We will call family if okay with the patient.  Disposition Plan: Patient is from home.  Anticipate discharge to home in the next 24 to 48  hours once electrolyte abnormalities have improved.  Consults called: None.   Objective: Vitals:   02/22/20 2122 02/23/20 0223 02/23/20 0707 02/23/20 1010  BP: 123/86 110/65 121/67 119/72  Pulse: 74 78 73 70  Resp: 18 17 17 19   Temp: (!) 97.5 F (36.4 C) 98.2 F (36.8 C) (!) 97.5 F (36.4 C) 98.1 F (36.7 C)  TempSrc: Oral Oral Oral Oral  SpO2: 100% 100% 100% 100%  Weight: 62.3 kg     Height: 6\' 1"  (1.854 m)      No intake or output data in the 24 hours ending 02/23/20 1435 Filed Weights   02/22/20 1523 02/22/20 2122  Weight: 62.1 kg 62.3 kg    Exam:  . General: 84 y.o. year-old male frail-appearing in no acute distress.  Alert and oriented x3. . Cardiovascular: Regular rate and rhythm with no rubs or gallops.  No thyromegaly or JVD noted.   Marland Kitchen Respiratory: Clear to auscultation with no wheezes or rales. Good inspiratory effort. . Abdomen: Soft nontender nondistended with normal bowel sounds x4 quadrants. . Musculoskeletal: No lower extremity edema. 2/4 pulses in all 4 extremities. Marland Kitchen Psychiatry: Mood is appropriate for condition and setting   Data Reviewed: CBC: Recent Labs  Lab 02/22/20 1540 02/23/20 0521  WBC 5.0 5.5  HGB 12.1* 11.1*  HCT 37.4* 34.2*  MCV 102.2* 103.3*  PLT 150 025   Basic Metabolic Panel: Recent Labs  Lab 02/22/20 1540 02/23/20 0521  NA 153* 154*  K 4.1 4.1  CL 117* 121*  CO2 27 23  GLUCOSE 114* 91  BUN 92* 89*  CREATININE 3.15* 2.99*  CALCIUM 8.6* 8.5*   GFR: Estimated Creatinine Clearance: 17.1 mL/min (A) (by C-G formula based on SCr of 2.99 mg/dL (H)). Liver Function Tests: Recent Labs  Lab 02/22/20 1540  AST 175*  ALT 253*  ALKPHOS 161*  BILITOT 0.7  PROT 4.9*  ALBUMIN 2.3*   Recent Labs  Lab 02/22/20 1540  LIPASE 20   No results for input(s): AMMONIA in the last 168 hours. Coagulation Profile: Recent Labs  Lab 02/22/20 2128  INR 0.9   Cardiac Enzymes: No results for input(s): CKTOTAL, CKMB, CKMBINDEX,  TROPONINI in the last 168 hours. BNP (last 3 results) Recent Labs    12/29/19 1639 02/13/20 1431  PROBNP 224.0* 84.0   HbA1C: No results for input(s): HGBA1C in the last 72 hours. CBG: No results for input(s): GLUCAP in the last 168 hours. Lipid Profile: No results for input(s): CHOL, HDL, LDLCALC, TRIG, CHOLHDL, LDLDIRECT in the last 72 hours. Thyroid Function Tests: Recent Labs    02/22/20 1541  TSH 2.428   Anemia Panel: No results for input(s): VITAMINB12, FOLATE, FERRITIN, TIBC, IRON, RETICCTPCT in the last 72 hours. Urine analysis:    Component Value Date/Time   COLORURINE YELLOW 02/22/2020 1540   APPEARANCEUR CLEAR 02/22/2020 1540   LABSPEC 1.014 02/22/2020 1540   PHURINE 6.0 02/22/2020 1540   GLUCOSEU NEGATIVE 02/22/2020 1540   GLUCOSEU NEGATIVE 01/02/2020 1028   HGBUR NEGATIVE 02/22/2020 1540   [place] 02/22/2020 1540   [place] 02/22/2020 1540  PROTEINUR >=300 (A) 02/22/2020 1540   UROBILINOGEN 0.2 01/02/2020 1028   NITRITE NEGATIVE 02/22/2020 1540   LEUKOCYTESUR NEGATIVE 02/22/2020 1540   Sepsis Labs: @LABRCNTIP (procalcitonin:4,lacticidven:4)  ) Recent Results (from the past 240 hour(s))  SARS CORONAVIRUS 2 (TAT 6-24 HRS) Nasopharyngeal Nasopharyngeal Swab     Status: None   Collection Time: 02/22/20  6:00 PM   Specimen: Nasopharyngeal Swab  Result Value Ref Range Status   SARS Coronavirus 2 NEGATIVE NEGATIVE Final    Comment: (NOTE) SARS-CoV-2 target nucleic acids are NOT DETECTED. The SARS-CoV-2 RNA is generally detectable in upper and lower respiratory specimens during the acute phase of infection. Negative results do not preclude SARS-CoV-2 infection, do not rule out co-infections with other pathogens, and should not be used as the sole basis for treatment or other patient management decisions. Negative results must be combined with clinical observations, patient history, and epidemiological information. The  expected result is Negative. Fact Sheet for Patients: SugarRoll.be Fact Sheet for Healthcare Providers: https://www.woods-mathews.com/ This test is not yet approved or cleared by the [place] FDA and  has been authorized for detection and/or diagnosis of SARS-CoV-2 by FDA under an Emergency Use Authorization (EUA). This EUA will remain  in effect (meaning this test can be used) for the duration of the COVID-19 declaration under Section 56 4(b)(1) of the Act, 21 U.S.C. section 360bbb-3(b)(1), unless the authorization is terminated or revoked sooner. Performed at [facility], [place] [address], [place], [place] [address]       Studies: DG ABD ACUTE 2+V W 1V CHEST  Result Date: 02/22/2020 CLINICAL DATA:  Nausea with eating. EXAM: DG ABDOMEN ACUTE W/ 1V CHEST COMPARISON:  CT abdomen pelvis dated 01/02/2020. FINDINGS: There is no evidence of dilated bowel loops or free intraperitoneal air. No radiopaque calculi or other significant radiographic abnormality is seen. Heart size and mediastinal contours are within normal limits. The lungs are hyperinflated. Both lungs are clear. IMPRESSION: Negative abdominal radiographs. No acute cardiopulmonary disease. Hyperinflated lungs likely reflect chronic obstructive pulmonary disease. Electronically Signed   By: Zerita Boers M.D.   On: 02/22/2020 17:34   US Abdomen Limited RUQ  Result Date: 02/22/2020 CLINICAL DATA:  Elevated LFTs, nausea EXAM: ULTRASOUND ABDOMEN LIMITED RIGHT UPPER QUADRANT COMPARISON:  01/02/2020, 05/02/2007 FINDINGS: Gallbladder: No gallstones or wall thickening visualized. No sonographic Murphy sign noted by sonographer. Common bile duct: Diameter: 3 mm Liver: No focal lesion identified. Within normal limits in parenchymal echogenicity. Portal vein is patent on color Doppler imaging with normal direction of blood flow towards the liver. Other: None. IMPRESSION: 1. Unremarkable right  upper quadrant ultrasound. Electronically Signed   By: Randa Ngo M.D.   On: 02/22/2020 17:56    Scheduled Meds: . amitriptyline  25 mg Oral QHS  . aspirin EC  81 mg Oral Daily  . heparin  5,000 Units Subcutaneous Q8H  . pantoprazole  40 mg Oral Daily  . tamsulosin  0.4 mg Oral QPC supper    Continuous Infusions: . sodium chloride       LOS: 0 days     Kayleen Memos, MD [facility] Pager [phone]  If 7PM-7AM, please contact night-coverage [email or web address] Password [facility] 02/23/2020, 2:35 PM

## 2020-02-24 DIAGNOSIS — R7401 Elevation of levels of liver transaminase levels: Secondary | ICD-10-CM | POA: Diagnosis not present

## 2020-02-24 DIAGNOSIS — K2100 Gastro-esophageal reflux disease with esophagitis, without bleeding: Secondary | ICD-10-CM | POA: Diagnosis not present

## 2020-02-24 DIAGNOSIS — N08 Glomerular disorders in diseases classified elsewhere: Secondary | ICD-10-CM | POA: Diagnosis not present

## 2020-02-24 DIAGNOSIS — N048 Nephrotic syndrome with other morphologic changes: Secondary | ICD-10-CM | POA: Diagnosis not present

## 2020-02-24 DIAGNOSIS — K2971 Gastritis, unspecified, with bleeding: Secondary | ICD-10-CM | POA: Diagnosis not present

## 2020-02-24 DIAGNOSIS — N19 Unspecified kidney failure: Secondary | ICD-10-CM | POA: Diagnosis not present

## 2020-02-24 DIAGNOSIS — E872 Acidosis: Secondary | ICD-10-CM | POA: Diagnosis not present

## 2020-02-24 DIAGNOSIS — E870 Hyperosmolality and hypernatremia: Secondary | ICD-10-CM | POA: Diagnosis not present

## 2020-02-24 DIAGNOSIS — E785 Hyperlipidemia, unspecified: Secondary | ICD-10-CM | POA: Diagnosis present

## 2020-02-24 DIAGNOSIS — R110 Nausea: Secondary | ICD-10-CM | POA: Diagnosis not present

## 2020-02-24 DIAGNOSIS — Z515 Encounter for palliative care: Secondary | ICD-10-CM | POA: Diagnosis not present

## 2020-02-24 DIAGNOSIS — Z681 Body mass index (BMI) 19 or less, adult: Secondary | ICD-10-CM | POA: Diagnosis not present

## 2020-02-24 DIAGNOSIS — R809 Proteinuria, unspecified: Secondary | ICD-10-CM | POA: Diagnosis not present

## 2020-02-24 DIAGNOSIS — R634 Abnormal weight loss: Secondary | ICD-10-CM | POA: Diagnosis not present

## 2020-02-24 DIAGNOSIS — M87852 Other osteonecrosis, left femur: Secondary | ICD-10-CM | POA: Diagnosis present

## 2020-02-24 DIAGNOSIS — I959 Hypotension, unspecified: Secondary | ICD-10-CM | POA: Diagnosis not present

## 2020-02-24 DIAGNOSIS — R69 Illness, unspecified: Secondary | ICD-10-CM | POA: Diagnosis not present

## 2020-02-24 DIAGNOSIS — R112 Nausea with vomiting, unspecified: Secondary | ICD-10-CM | POA: Diagnosis not present

## 2020-02-24 DIAGNOSIS — I130 Hypertensive heart and chronic kidney disease with heart failure and stage 1 through stage 4 chronic kidney disease, or unspecified chronic kidney disease: Secondary | ICD-10-CM | POA: Diagnosis present

## 2020-02-24 DIAGNOSIS — K3189 Other diseases of stomach and duodenum: Secondary | ICD-10-CM | POA: Diagnosis not present

## 2020-02-24 DIAGNOSIS — N184 Chronic kidney disease, stage 4 (severe): Secondary | ICD-10-CM | POA: Diagnosis not present

## 2020-02-24 DIAGNOSIS — R531 Weakness: Secondary | ICD-10-CM | POA: Diagnosis not present

## 2020-02-24 DIAGNOSIS — N179 Acute kidney failure, unspecified: Secondary | ICD-10-CM | POA: Diagnosis not present

## 2020-02-24 DIAGNOSIS — E43 Unspecified severe protein-calorie malnutrition: Secondary | ICD-10-CM | POA: Diagnosis not present

## 2020-02-24 DIAGNOSIS — R5381 Other malaise: Secondary | ICD-10-CM | POA: Diagnosis not present

## 2020-02-24 DIAGNOSIS — E8809 Other disorders of plasma-protein metabolism, not elsewhere classified: Secondary | ICD-10-CM | POA: Diagnosis not present

## 2020-02-24 DIAGNOSIS — K209 Esophagitis, unspecified without bleeding: Secondary | ICD-10-CM | POA: Diagnosis not present

## 2020-02-24 DIAGNOSIS — R630 Anorexia: Secondary | ICD-10-CM | POA: Diagnosis not present

## 2020-02-24 DIAGNOSIS — R0602 Shortness of breath: Secondary | ICD-10-CM | POA: Diagnosis not present

## 2020-02-24 DIAGNOSIS — I5032 Chronic diastolic (congestive) heart failure: Secondary | ICD-10-CM | POA: Diagnosis not present

## 2020-02-24 DIAGNOSIS — Z20822 Contact with and (suspected) exposure to covid-19: Secondary | ICD-10-CM | POA: Diagnosis not present

## 2020-02-24 DIAGNOSIS — N049 Nephrotic syndrome with unspecified morphologic changes: Secondary | ICD-10-CM | POA: Diagnosis not present

## 2020-02-24 DIAGNOSIS — E860 Dehydration: Secondary | ICD-10-CM | POA: Diagnosis present

## 2020-02-24 DIAGNOSIS — N400 Enlarged prostate without lower urinary tract symptoms: Secondary | ICD-10-CM | POA: Diagnosis present

## 2020-02-24 DIAGNOSIS — F331 Major depressive disorder, recurrent, moderate: Secondary | ICD-10-CM | POA: Diagnosis present

## 2020-02-24 DIAGNOSIS — Z7189 Other specified counseling: Secondary | ICD-10-CM | POA: Diagnosis not present

## 2020-02-24 DIAGNOSIS — R627 Adult failure to thrive: Secondary | ICD-10-CM | POA: Diagnosis not present

## 2020-02-24 DIAGNOSIS — Z66 Do not resuscitate: Secondary | ICD-10-CM | POA: Diagnosis not present

## 2020-02-24 DIAGNOSIS — E854 Organ-limited amyloidosis: Secondary | ICD-10-CM | POA: Diagnosis not present

## 2020-02-24 DIAGNOSIS — N170 Acute kidney failure with tubular necrosis: Secondary | ICD-10-CM | POA: Diagnosis not present

## 2020-02-24 DIAGNOSIS — M2550 Pain in unspecified joint: Secondary | ICD-10-CM | POA: Diagnosis not present

## 2020-02-24 DIAGNOSIS — Z7401 Bed confinement status: Secondary | ICD-10-CM | POA: Diagnosis not present

## 2020-02-24 DIAGNOSIS — R402411 Glasgow coma scale score 13-15, in the field [EMT or ambulance]: Secondary | ICD-10-CM | POA: Diagnosis not present

## 2020-02-24 DIAGNOSIS — I129 Hypertensive chronic kidney disease with stage 1 through stage 4 chronic kidney disease, or unspecified chronic kidney disease: Secondary | ICD-10-CM | POA: Diagnosis not present

## 2020-02-24 DIAGNOSIS — E8581 Light chain (AL) amyloidosis: Secondary | ICD-10-CM | POA: Diagnosis present

## 2020-02-24 DIAGNOSIS — R64 Cachexia: Secondary | ICD-10-CM | POA: Diagnosis present

## 2020-02-24 DIAGNOSIS — K862 Cyst of pancreas: Secondary | ICD-10-CM | POA: Diagnosis not present

## 2020-02-24 DIAGNOSIS — I951 Orthostatic hypotension: Secondary | ICD-10-CM | POA: Diagnosis not present

## 2020-02-24 DIAGNOSIS — R109 Unspecified abdominal pain: Secondary | ICD-10-CM | POA: Diagnosis not present

## 2020-02-24 DIAGNOSIS — R1310 Dysphagia, unspecified: Secondary | ICD-10-CM | POA: Diagnosis not present

## 2020-02-24 LAB — COMPREHENSIVE METABOLIC PANEL
ALT: 147 U/L — ABNORMAL HIGH (ref 0–44)
AST: 61 U/L — ABNORMAL HIGH (ref 15–41)
Albumin: 2.1 g/dL — ABNORMAL LOW (ref 3.5–5.0)
Alkaline Phosphatase: 135 U/L — ABNORMAL HIGH (ref 38–126)
Anion gap: 8 (ref 5–15)
BUN: 86 mg/dL — ABNORMAL HIGH (ref 8–23)
CO2: 24 mmol/L (ref 22–32)
Calcium: 8.3 mg/dL — ABNORMAL LOW (ref 8.9–10.3)
Chloride: 119 mmol/L — ABNORMAL HIGH (ref 98–111)
Creatinine, Ser: 3.39 mg/dL — ABNORMAL HIGH (ref 0.61–1.24)
GFR calc Af Amer: 19 mL/min — ABNORMAL LOW (ref >60)
GFR calc non Af Amer: 16 mL/min — ABNORMAL LOW (ref >60)
Glucose, Bld: 100 mg/dL — ABNORMAL HIGH (ref 70–99)
Potassium: 4.1 mmol/L (ref 3.5–5.1)
Sodium: 151 mmol/L — ABNORMAL HIGH (ref 135–145)
Total Bilirubin: 0.7 mg/dL (ref 0.3–1.2)
Total Protein: 4.3 g/dL — ABNORMAL LOW (ref 6.5–8.1)

## 2020-02-24 NOTE — Progress Notes (Signed)
PROGRESS NOTE  Alvin Ingram TML:[id number] DOB: [birth year] DOA: 02/22/2020 PCP: Alvin Rail, MD  HPI/Recap of past 24 hours: Alvin Ingram is a 84 y.o. male with medical history significant for BPH, hyperlipidemia and GERD who presents with concerns of decreased p.o. intake and weakness. Patient overall not a good historian on account timeline.  He reports that family advised him to present to the ED but they have been concerned about his decreased appetite.  He is still able to tolerate some fruit and cereal but has not had much appetite with a more balanced meal for the past several weeks.  He is also noticed nausea and some occasional vomiting that might be worse with food.  He also occasionally notices abdominal pain.  Lower quadrant.  When he urinates he thinks he can hear his "stomach sloshing around."  He notes occasional rare dysuria.  He says that he has seen bright red blood once in a while when he wipes due to bowel movement.  Denies black tarry stool.  He denies any fever.  No night sweats.  Thinks he has lost about 25 pounds in the past several weeks.   He also noticed more balance issues and is afraid that he will fall whenever he gets up to urinate.  He feels that his right side is stronger than his left side.  Has been evaluated by his PCP about 2 weeks ago and had CBC showing mildly elevated AST of 40 and ALT of 59.  He also was noted to have a CT of his abdomen back in February that showed extensive sigmoid diverticulosis.  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  There was a recommendation for further evaluation with MRI with and without contrast on an outpatient basis.  Patient is a former tobacco user but quit about 40 years ago.  He denies alcohol or illicit drug use. Family history includes a brother with lung cancer but no other abdominal related cancer that he knows of.  Elevated  serum sodium, cr with AKI, prerenal in the setting of dehydration with poor oral intake.  02/24/20: Reports intermittent nausea with no vomiting.  Associated with poor appetite and poor oral intake.  Updated his niece via phone who reports patient has lost 30 to 40 pound weight loss in the last 1 month, has also had recurrent falls 3 times in the last 2 weeks.  Assessment/Plan: Principal Problem:   Failure to thrive in adult Active Problems:   AKI (acute kidney injury) ([place])   Transaminitis   Weakness   Hypernatremia   Protein-calorie malnutrition, severe   Failure to thrive in adult/ Severe weight loss Presented with poor oral intake, poor appetite weight loss 30-40 lbs in 1 month BMI 18 with evidence of muscle mass loss Dehydrated with electrolyte abnormalities, being repleted with IV hydration Dietary consult Encourage increasing protein calorie intake.  Worsening AKI on CKD 3A Baseline creatinine appears to be 1.8 with GFR of 42 Presented with creatinine 3.1 with GFR of 20 Creatinine trending up despite IV fluid Continue IV fluid Monitor urine output Avoid nephrotoxins, hypotension and dehydration Repeat BMP in the morning  Acute transaminitis, improving Unclear etiology Elevated alkaline phosphatase, AST and ALT, trending down T bili normal Right upper quadrant abdominal ultrasound negative Acute hepatitis panel negative Avoid hepatotoxic agents Repeat CMP in the morning  Hypovolemic hypernatremia, improving Presented with serum sodium 153> 151 Started  on 1/2 normal saline Continue to monitor serum sodium If no improvement switch to D5 half normal saline Repeat BMP this afternoon  Chronic diastolic CHF Hypervolemic on exam Last 2D echo done on 12/17/2019 showed LVEF 65-70% With grade 1 diastolic dysfunction Closely monitor volume status while on IV fluid  GERD Continue p.o. Protonix  BPH Continue Flomax  Ambulatory dysfunction with frequent falls 3 falls  at home within the last 2 weeks per his niece [name] in the bathroom hospital 3/29, denies hitting his head Denies feeling dizzy prior to falling Obtain orthosthatic vital signs PT recs home PTOT Lives alone, has not been able to appropriately take care of himself per his niece. Continue Fall precautions  Chronic depression On amitriptyline   DVT prophylaxis: Heparin SQ 3 times daily Code Status: Full Family Communication: Updated his niece via phone.  Disposition Plan: Patient is from home.  Anticipate discharge to home in the next 24 to 48 hours once electrolyte abnormalities have improved.  Consults called: None.   Objective: Vitals:   02/23/20 1510 02/23/20 2242 02/24/20 0658 02/24/20 1352  BP: 134/81 (!) 148/85 (!) 149/84 110/68  Pulse: 72 72 67 81  Resp: 18 18 16 15   Temp: (!) 97.5 F (36.4 C) 98.1 F (36.7 C) 97.8 F (36.6 C) 97.8 F (36.6 C)  TempSrc: Oral Oral Oral Oral  SpO2: 100% 99% 98% 99%  Weight:      Height:       No intake or output data in the 24 hours ending 02/24/20 1533 Filed Weights   02/22/20 1523 02/22/20 2122  Weight: 62.1 kg 62.3 kg    Exam:  . General: 84 y.o. year-old male well-appearing no acute distress.  Alert oriented x3. . Cardiovascular: Regular rate and rhythm no rubs or gallops..   . Respiratory: Clear to auscultation no wheezes or rales. . Abdomen: Soft nontender normal bowel sounds present.   . Musculoskeletal: No lower extremity edema bilaterally.   Marland Kitchen Psychiatry: Mood is appropriate for condition and setting.   Data Reviewed: CBC: Recent Labs  Lab 02/22/20 1540 02/23/20 0521  WBC 5.0 5.5  HGB 12.1* 11.1*  HCT 37.4* 34.2*  MCV 102.2* 103.3*  PLT 150 093   Basic Metabolic Panel: Recent Labs  Lab 02/22/20 1540 02/23/20 0521 02/23/20 1709 02/24/20 0548  NA 153* 154* 153* 151*  K 4.1 4.1 4.1 4.1  CL 117* 121* 120* 119*  CO2 27 23 24 24   GLUCOSE 114* 91 153* 100*  BUN 92* 89* 90* 86*  CREATININE 3.15* 2.99*  3.23* 3.39*  CALCIUM 8.6* 8.5* 8.3* 8.3*   GFR: Estimated Creatinine Clearance: 15.1 mL/min (A) (by C-G formula based on SCr of 3.39 mg/dL (H)). Liver Function Tests: Recent Labs  Lab 02/22/20 1540 02/24/20 0548  AST 175* 61*  ALT 253* 147*  ALKPHOS 161* 135*  BILITOT 0.7 0.7  PROT 4.9* 4.3*  ALBUMIN 2.3* 2.1*   Recent Labs  Lab 02/22/20 1540  LIPASE 20   No results for input(s): AMMONIA in the last 168 hours. Coagulation Profile: Recent Labs  Lab 02/22/20 2128  INR 0.9   Cardiac Enzymes: No results for input(s): CKTOTAL, CKMB, CKMBINDEX, TROPONINI in the last 168 hours. BNP (last 3 results) Recent Labs    12/29/19 1639 02/13/20 1431  PROBNP 224.0* 84.0   HbA1C: No results for input(s): HGBA1C in the last 72 hours. CBG: No results for input(s): GLUCAP in the last 168 hours. Lipid Profile: No results for input(s): CHOL, HDL, LDLCALC,  TRIG, CHOLHDL, LDLDIRECT in the last 72 hours. Thyroid Function Tests: Recent Labs    02/22/20 1541  TSH 2.428   Anemia Panel: No results for input(s): VITAMINB12, FOLATE, FERRITIN, TIBC, IRON, RETICCTPCT in the last 72 hours. Urine analysis:    Component Value Date/Time   COLORURINE YELLOW 02/22/2020 1540   APPEARANCEUR CLEAR 02/22/2020 1540   LABSPEC 1.014 02/22/2020 1540   PHURINE 6.0 02/22/2020 1540   GLUCOSEU NEGATIVE 02/22/2020 1540   GLUCOSEU NEGATIVE 01/02/2020 1028   HGBUR NEGATIVE 02/22/2020 1540   BILIRUBINUR NEGATIVE 02/22/2020 1540   KETONESUR NEGATIVE 02/22/2020 1540   PROTEINUR >=300 (A) 02/22/2020 1540   UROBILINOGEN 0.2 01/02/2020 1028   NITRITE NEGATIVE 02/22/2020 1540   LEUKOCYTESUR NEGATIVE 02/22/2020 1540   Sepsis Labs: @LABRCNTIP (procalcitonin:4,lacticidven:4)  ) Recent Results (from the past 240 hour(s))  SARS CORONAVIRUS 2 (TAT 6-24 HRS) Nasopharyngeal Nasopharyngeal Swab     Status: None   Collection Time: 02/22/20  6:00 PM   Specimen: Nasopharyngeal Swab  Result Value Ref Range Status     SARS Coronavirus 2 NEGATIVE NEGATIVE Final    Comment: (NOTE) SARS-CoV-2 target nucleic acids are NOT DETECTED. The SARS-CoV-2 RNA is generally detectable in upper and lower respiratory specimens during the acute phase of infection. Negative results do not preclude SARS-CoV-2 infection, do not rule out co-infections with other pathogens, and should not be used as the sole basis for treatment or other patient management decisions. Negative results must be combined with clinical observations, patient history, and epidemiological information. The expected result is Negative. Fact Sheet for Patients: SugarRoll.be Fact Sheet for Healthcare Providers: https://www.woods-mathews.com/ This test is not yet approved or cleared by the [place] FDA and  has been authorized for detection and/or diagnosis of SARS-CoV-2 by FDA under an Emergency Use Authorization (EUA). This EUA will remain  in effect (meaning this test can be used) for the duration of the COVID-19 declaration under Section 56 4(b)(1) of the Act, 21 U.S.C. section 360bbb-3(b)(1), unless the authorization is terminated or revoked sooner. Performed at [facility], [place] [address], [place], [place] [address]       Studies: No results found.  Scheduled Meds: . amitriptyline  25 mg Oral QHS  . aspirin EC  81 mg Oral Daily  . feeding supplement (ENSURE ENLIVE)  237 mL Oral BID BM  . heparin  5,000 Units Subcutaneous Q8H  . multivitamin with minerals  1 tablet Oral Daily  . pantoprazole  40 mg Oral Daily  . tamsulosin  0.4 mg Oral QPC supper    Continuous Infusions: . sodium chloride 50 mL/hr at 02/24/20 1448     LOS: 0 days     Kayleen Memos, MD [facility] Pager [phone]  If 7PM-7AM, please contact night-coverage [email or web address] Password [facility] 02/24/2020, 3:33 PM

## 2020-02-24 NOTE — Evaluation (Addendum)
Occupational Therapy Evaluation Patient Details Name: Alvin Ingram MRN: [id number] DOB: [birth year] Today's Date: 02/24/2020    History of Present Illness Alvin Ingram is a 84 y.o. male with medical history significant for BPH, hyperlipidemia and GERD who presents with concerns of decreased p.o. intake and weakness.   Clinical Impression   This 84 y/o male presents with the above. PTA pt reports living alone, reports was mod independent with ADL and functional mobility using RW, reports family assists intermittently with ADL/iADL needs PRN. Pt requiring some encouragement to participate initially in session today. Pt engaging in seated grooming ADL with setup/supervision assist, tolerating sitting EOB approx 10 min during session. Pt requiring minguard-minA for functional transfers and taking side steps along EOB using RW, requiring at least minA for LB ADL. Suspect pt will progress well with mobility; he will benefit from continued acute OT services and currently recommend follow up [facility] services as well as initial 24hr supervision/assist to ensure/maximize safety and independence with ADL and mobility and to reduce risk for falls after return home. If 24hr not available may need to consider SNF. Will follow.     Follow Up Recommendations  Home health OT;Supervision/Assistance - 24 hour(24hr initially)    Equipment Recommendations  Tub/shower seat(need to confirm, he may already have)           Precautions / Restrictions Precautions Precautions: Fall Restrictions Weight Bearing Restrictions: No      Mobility Bed Mobility Overal bed mobility: Needs Assistance Bed Mobility: Supine to Sit;Sit to Supine     Supine to sit: Supervision Sit to supine: Min assist   General bed mobility comments: requires assist for LEs onto bed   Transfers Overall transfer level: Needs assistance Equipment used: Rolling walker (2 wheeled) Transfers: Sit to/from Stand Sit to Stand: Min assist         General transfer comment: requires light boosting assist to rise from lower sruface height of bed     Balance Overall balance assessment: Needs assistance Sitting-balance support: Feet supported Sitting balance-Leahy Scale: Good     Standing balance support: Bilateral upper extremity supported Standing balance-Leahy Scale: Poor Standing balance comment: use of UE support for balance                            ADL either performed or assessed with clinical judgement   ADL Overall ADL's : Needs assistance/impaired Eating/Feeding: Set up;Sitting Eating/Feeding Details (indicate cue type and reason): encouraged PO intake Grooming: Wash/dry face;Oral care;Brushing hair;Set up;Supervision/safety;Sitting Grooming Details (indicate cue type and reason): performed seated EOB, states preference to perform seated EOB vs standing at sink for increased safety  Upper Body Bathing: Supervision/ safety;Set up;Sitting   Lower Body Bathing: Min guard;Minimal assistance;Sit to/from stand   Upper Body Dressing : Set up;Supervision/safety;Sitting   Lower Body Dressing: Minimal assistance;Sit to/from stand   Toilet Transfer: Minimal assistance;Stand-pivot;RW Armed forces technical officer Details (indicate cue type and reason): simulated via transfer to/from EOB Toileting- Clothing Manipulation and Hygiene: Minimal assistance;Sit to/from stand       Functional mobility during ADLs: Minimal assistance;Rolling walker General ADL Comments: pt with weakness, decreased activity tolerance                          Pertinent Vitals/Pain Pain Assessment: Faces Faces Pain Scale: Hurts a little bit Pain Location: generalized Pain Descriptors / Indicators: Discomfort Pain Intervention(s): Limited activity within patient's tolerance;Monitored during session;Repositioned  Hand Dominance     Extremity/Trunk Assessment Upper Extremity Assessment Upper Extremity Assessment: Generalized  weakness   Lower Extremity Assessment Lower Extremity Assessment: Defer to PT evaluation;Generalized weakness   Cervical / Trunk Assessment Cervical / Trunk Assessment: Normal   Communication Communication Communication: No difficulties   Cognition Arousal/Alertness: Awake/alert Behavior During Therapy: Flat affect Overall Cognitive Status: No family/caregiver present to determine baseline cognitive functioning                                 General Comments: noted delayed processing, pt closing eyes at one point start of session and required encouragement to continue participating in session, once seated upright and engaging in ADL pt more participatory    General Comments       Exercises     Shoulder Instructions      Home Living Family/patient expects to be discharged to:: Private residence Living Arrangements: Alone Available Help at Discharge: Family;Available PRN/intermittently Type of Home: House Home Access: Stairs to enter [employer] of Steps: 2   Home Layout: One level     Bathroom Shower/Tub: Occupational psychologist: Standard     Home Equipment: Shower seat - built in;Grab bars - tub/shower(may need to clarify)   Additional Comments: most info from previous encounter, patient vague and not answering specific questions regarding hishome      Prior Functioning/Environment Level of Independence: Independent with assistive device(s)        Comments: reports using RW for mobility, reports family (niece/nephew) assist intermittently PRN. Pt HOH(?) and with some delayed processing so unsure as to full accuracy/detail of home setup/PLOF        OT Problem List: Decreased strength;Decreased range of motion;Decreased activity tolerance;Impaired balance (sitting and/or standing);Decreased knowledge of use of DME or AE;Decreased knowledge of precautions;Decreased cognition      OT Treatment/Interventions: Self-care/ADL  training;Therapeutic exercise;Energy conservation;DME and/or AE instruction;Therapeutic activities;Patient/family education;Balance training    OT Goals(Current goals can be found in the care plan section) Acute Rehab OT Goals Patient Stated Goal: agreeable to working with therapy OT Goal Formulation: With patient Time For Goal Achievement: 03/09/20 Potential to Achieve Goals: Good  OT Frequency: Min 2X/week   Barriers to D/C: Decreased caregiver support          Co-evaluation              AM-PAC OT "6 Clicks" Daily Activity     Outcome Measure Help from another person eating meals?: None Help from another person taking care of personal grooming?: A Little Help from another person toileting, which includes using toliet, bedpan, or urinal?: A Little Help from another person bathing (including washing, rinsing, drying)?: A Little Help from another person to put on and taking off regular upper body clothing?: None Help from another person to put on and taking off regular lower body clothing?: A Lot 6 Click Score: 19   End of Session Equipment Utilized During Treatment: Rolling walker Nurse Communication: Mobility status  Activity Tolerance: Patient tolerated treatment well Patient left: in bed;with call bell/phone within reach;with bed alarm set  OT Visit Diagnosis: Muscle weakness (generalized) (M62.81);Adult, failure to thrive (R62.7)                Time: 7619-5093 OT Time Calculation (min): 23 min Charges:  OT General Charges $OT Visit: 1 Visit OT Evaluation $OT Eval Moderate Complexity: 1 Mod  Alvin Ingram, OT Acute Rehabilitation  Services Pager [phone] Office [phone]   Raymondo Band 02/24/2020, 8:56 AM

## 2020-02-25 ENCOUNTER — Inpatient Hospital Stay (HOSPITAL_COMMUNITY): Payer: Medicare HMO

## 2020-02-25 LAB — BASIC METABOLIC PANEL
Anion gap: 6 (ref 5–15)
BUN: 87 mg/dL — ABNORMAL HIGH (ref 8–23)
CO2: 24 mmol/L (ref 22–32)
Calcium: 8.1 mg/dL — ABNORMAL LOW (ref 8.9–10.3)
Chloride: 118 mmol/L — ABNORMAL HIGH (ref 98–111)
Creatinine, Ser: 3.12 mg/dL — ABNORMAL HIGH (ref 0.61–1.24)
GFR calc Af Amer: 21 mL/min — ABNORMAL LOW (ref >60)
GFR calc non Af Amer: 18 mL/min — ABNORMAL LOW (ref >60)
Glucose, Bld: 109 mg/dL — ABNORMAL HIGH (ref 70–99)
Potassium: 4.3 mmol/L (ref 3.5–5.1)
Sodium: 148 mmol/L — ABNORMAL HIGH (ref 135–145)

## 2020-02-25 MED ORDER — SERTRALINE HCL 25 MG PO TABS
25.0000 mg | ORAL_TABLET | Freq: Every day | ORAL | Status: DC
  Administered 2020-02-25 – 2020-03-06 (×10): 25 mg via ORAL
  Filled 2020-02-25 (×11): qty 1

## 2020-02-25 MED ORDER — SENNOSIDES-DOCUSATE SODIUM 8.6-50 MG PO TABS
2.0000 | ORAL_TABLET | Freq: Two times a day (BID) | ORAL | Status: DC
  Administered 2020-02-25 – 2020-03-18 (×31): 2 via ORAL
  Filled 2020-02-25 (×35): qty 2

## 2020-02-25 NOTE — Progress Notes (Signed)
Physical Therapy Treatment Patient Details Name: Alvin Ingram MRN: [id number] DOB: 1936-03-12 Today's Date: 02/25/2020    History of Present Illness Alvin Ingram is a 84 y.o. male with medical history significant for BPH, hyperlipidemia and GERD who presents with concerns of decreased p.o. intake and weakness.    PT Comments    Pt's evaluation was limited due to nose bleeds and he was unable to ambulate.  Pt was able to ambulate 54' with RW; however, pt is unsteady and did require min A for transfers.  Pt lives alone and with history of falls. Changed d/c recommendation to SNF at this time due to fall risk and no supervision.      Follow Up Recommendations  SNF     Equipment Recommendations  Rolling walker with 5" wheels    Recommendations for Other Services       Precautions / Restrictions Precautions Precautions: Fall Restrictions Weight Bearing Restrictions: No    Mobility  Bed Mobility Overal bed mobility: Needs Assistance Bed Mobility: Supine to Sit;Sit to Supine     Supine to sit: Min assist;HOB elevated Sit to supine: Min assist;HOB elevated      Transfers Overall transfer level: Needs assistance Equipment used: Rolling walker (2 wheeled) Transfers: Sit to/from Stand Sit to Stand: Min assist;From elevated surface         General transfer comment: Unable to stand from lower surface; required bed elevated  Ambulation/Gait Ambulation/Gait assistance: Min assist Gait Distance (Feet): 60 Feet Assistive device: Rolling walker (2 wheeled) Gait Pattern/deviations: Step-through pattern;Decreased stride length;Shuffle Gait velocity: decreased   General Gait Details: unsteady; required cues for RW proximity; noted decreased R dorsiflexion that pt able to correct   Stairs             Wheelchair Mobility    Modified Rankin (Stroke Patients Only)       Balance Overall balance assessment: Needs assistance Sitting-balance support: Feet  supported Sitting balance-Leahy Scale: Good     Standing balance support: Bilateral upper extremity supported;During functional activity Standing balance-Leahy Scale: Fair                              Cognition Arousal/Alertness: Awake/alert Behavior During Therapy: Flat affect Overall Cognitive Status: No family/caregiver present to determine baseline cognitive functioning                                 General Comments: Very flat, minimal conversation      Exercises      General Comments General comments (skin integrity, edema, etc.): VSS; Required encouragment and education on importance of mobility.  Pt agreed to ambulation but declined further activity      Pertinent Vitals/Pain Pain Assessment: No/denies pain    Home Living                      Prior Function            PT Goals (current goals can now be found in the care plan section) Progress towards PT goals: Progressing toward goals    Frequency    Min 3X/week      PT Plan Discharge plan needs to be updated    Co-evaluation              AM-PAC PT "6 Clicks" Mobility   Outcome Measure  Help needed turning from your back  to your side while in a flat bed without using bedrails?: A Little Help needed moving from lying on your back to sitting on the side of a flat bed without using bedrails?: A Little Help needed moving to and from a bed to a chair (including a wheelchair)?: A Little Help needed standing up from a chair using your arms (e.g., wheelchair or bedside chair)?: A Little Help needed to walk in hospital room?: A Little Help needed climbing 3-5 steps with a railing? : A Lot 6 Click Score: 17    End of Session Equipment Utilized During Treatment: Gait belt Activity Tolerance: Patient tolerated treatment well Patient left: in bed;with call bell/phone within reach;with bed alarm set Nurse Communication: Mobility status PT Visit Diagnosis: Unsteadiness  on feet (R26.81);Muscle weakness (generalized) (M62.81);Difficulty in walking, not elsewhere classified (R26.2)     Time: 1250-1306 PT Time Calculation (min) (ACUTE ONLY): 16 min  Charges:  $Gait Training: 8-22 mins                     Alvin Ingram, PT Acute Rehab Services Pager [phone] [facility] [phone]    Alvin Ingram 02/25/2020, 2:37 PM

## 2020-02-25 NOTE — Consult Note (Signed)
[facility] Face-to-Face Psychiatry Consult   Reason for Consult:  "uncontrolled depression; on amitryptiline" Referring Physician:  Dr Nevada Crane Patient Identification: Alvin Ingram MRN:  [id number] Principal Diagnosis: Failure to thrive in adult Diagnosis:  Principal Problem:   Failure to thrive in adult Active Problems:   AKI (acute kidney injury) ([place])   Transaminitis   Weakness   Hypernatremia   Protein-calorie malnutrition, severe   Total Time spent with patient: 30 minutes  Subjective:   Alvin Ingram is a 84 y.o. male patient.  Patient assessed by nurse practitioner.  Patient alert and oriented, answers appropriately.  Patient reports he lives alone in [place].  Patient endorses difficulty with ADLs including ambulation, states "I can walk short distances only using my walker."  Patient denies any alcohol or substance use, denies history of same.  Patient denies access to weapons.  Patient reports sleep is "pretty good."  Patient reports "I do not have an appetite." Patient reports his brother as well as his niece help him manage his medications, unable to recall medications.  Patient gives verbal consent to reach out to niece Miles Costain phone number [phone] regarding treatment plan.  Patient reports both his brother and his niece stop by to help him manage at home however patient reports concerns regarding his ability to live at home alone.  Patient denies suicidal ideations, denies any history of suicide attempts, denies any history of self-harm.  Patient denies homicidal ideations.  Patient denies auditory and visual hallucinations.  Patient denies symptoms of paranoia. Spoke with patient's niece Miles Costain who denies concerns for patient safety regarding active suicidality or self-harm.  Patient's niece confirms patient does not have any access to weapons to her knowledge.  Patient's niece reports "he sleeps all the time and I believe he has lost about 40 pounds in the last  1-1/2 months."  Patient's daughter reports concerns regarding the patient continuing to live alone.  HPI: Patient admitted with weakness.  Past Psychiatric History: Denies  Risk to Self:  Denies Risk to Others:  Denies Prior Inpatient Therapy:  Denies  Prior Outpatient Therapy:   denies  Past Medical History:  Past Medical History:  Diagnosis Date  . Cardiac murmur    Aortic  . Diverticulosis 2006  . GERD (gastroesophageal reflux disease)   . History of colonic polyps 2003,2011   Dr Earlean Shawl  . History of syncope 1966   ? stress related  . Hyperlipidemia   . Hypertension    hypertensive response @ stress test    Past Surgical History:  Procedure Laterality Date  . CATARACT EXTRACTION  2003 & 2011   Dr Charise Killian  . COLONOSCOPY  04/2005   tics, no polyps. Dr.Medoff  . COLONOSCOPY W/ POLYPECTOMY  2003, 2011   neg 2006; due 2016. Dr Earlean Shawl   Family History:  Family History  Problem Relation Age of Onset  . Heart attack Mother 50  . Colon polyps Brother   . Pneumonia Brother   . Heart attack Brother 62  . Lung cancer Brother   . Other Father        Benign Brain Tumor  . Hypertension Brother   . Tuberculosis Paternal Uncle   . Other Maternal Grandmother   . Diabetes Neg Hx   . Stroke Neg Hx    Family Psychiatric  History: Denies Social History:  Social History   Substance and Sexual Activity  Alcohol Use Yes   Comment: RARELY     Social History   Substance and Sexual  Activity  Drug Use No    Social History   Socioeconomic History  . Marital status: Single    Spouse name: Not on file  . Number of children: Not on file  . Years of education: Not on file  . Highest education level: Not on file  Occupational History  . Not on file  Tobacco Use  . Smoking status: Former Smoker    Quit date: 11/28/1967    Years since quitting: 52.2  . Smokeless tobacco: Never Used  . Tobacco comment: smoked 1955-1969 , up to 1 ppd  Substance and Sexual Activity  . Alcohol  use: Yes    Comment: RARELY  . Drug use: No  . Sexual activity: Not Currently  Other Topics Concern  . Not on file  Social History Narrative  . Not on file   Social Determinants of Health   Financial Resource Strain:   . Difficulty of Paying Living Expenses:   Food Insecurity:   . Worried About Charity fundraiser in the Last Year:   . Arboriculturist in the Last Year:   Transportation Needs:   . Film/video editor (Medical):   Marland Kitchen Lack of Transportation (Non-Medical):   Physical Activity:   . Days of Exercise per Week:   . Minutes of Exercise per Session:   Stress:   . Feeling of Stress :   Social Connections:   . Frequency of Communication with Friends and Family:   . Frequency of Social Gatherings with Friends and Family:   . Attends Religious Services:   . Active Member of Clubs or Organizations:   . Attends Archivist Meetings:   Marland Kitchen Marital Status:    Additional Social History:    Allergies:  No Known Allergies  Labs:  Results for orders placed or performed during the hospital encounter of 02/22/20 (from the past 48 hour(s))  Basic metabolic panel     Status: Abnormal   Collection Time: 02/23/20  5:09 PM  Result Value Ref Range   Sodium 153 (H) 135 - 145 mmol/L   Potassium 4.1 3.5 - 5.1 mmol/L   Chloride 120 (H) 98 - 111 mmol/L   CO2 24 22 - 32 mmol/L   Glucose, Bld 153 (H) 70 - 99 mg/dL    Comment: Glucose reference range applies only to samples taken after fasting for at least 8 hours.   BUN 90 (H) 8 - 23 mg/dL   Creatinine, Ser 3.23 (H) 0.61 - 1.24 mg/dL   Calcium 8.3 (L) 8.9 - 10.3 mg/dL   GFR calc non Af Amer 17 (L) >60 mL/min   GFR calc Af Amer 20 (L) >60 mL/min   Anion gap 9 5 - 15    Comment: Performed at [facility], [place] [address], [place], [place] [address]  Comprehensive metabolic panel     Status: Abnormal   Collection Time: 02/24/20  5:48 AM  Result Value Ref Range   Sodium 151 (H) 135 - 145 mmol/L    Potassium 4.1 3.5 - 5.1 mmol/L   Chloride 119 (H) 98 - 111 mmol/L   CO2 24 22 - 32 mmol/L   Glucose, Bld 100 (H) 70 - 99 mg/dL    Comment: Glucose reference range applies only to samples taken after fasting for at least 8 hours.   BUN 86 (H) 8 - 23 mg/dL   Creatinine, Ser 3.39 (H) 0.61 - 1.24 mg/dL   Calcium 8.3 (L) 8.9 - 10.3 mg/dL  Total Protein 4.3 (L) 6.5 - 8.1 g/dL   Albumin 2.1 (L) 3.5 - 5.0 g/dL   AST 61 (H) 15 - 41 U/L   ALT 147 (H) 0 - 44 U/L   Alkaline Phosphatase 135 (H) 38 - 126 U/L   Total Bilirubin 0.7 0.3 - 1.2 mg/dL   GFR calc non Af Amer 16 (L) >60 mL/min   GFR calc Af Amer 19 (L) >60 mL/min   Anion gap 8 5 - 15    Comment: Performed at [facility], [place] [address] [place], [place] [address]  Basic metabolic panel     Status: Abnormal   Collection Time: 02/25/20  5:33 AM  Result Value Ref Range   Sodium 148 (H) 135 - 145 mmol/L   Potassium 4.3 3.5 - 5.1 mmol/L   Chloride 118 (H) 98 - 111 mmol/L   CO2 24 22 - 32 mmol/L   Glucose, Bld 109 (H) 70 - 99 mg/dL    Comment: Glucose reference range applies only to samples taken after fasting for at least 8 hours.   BUN 87 (H) 8 - 23 mg/dL   Creatinine, Ser 3.12 (H) 0.61 - 1.24 mg/dL   Calcium 8.1 (L) 8.9 - 10.3 mg/dL   GFR calc non Af Amer 18 (L) >60 mL/min   GFR calc Af Amer 21 (L) >60 mL/min   Anion gap 6 5 - 15    Comment: Performed at [facility], [place] [address], [place], [place] [address]    Current Facility-Administered Medications  Medication Dose Route Frequency Provider Last Rate Last Admin  . 0.45 % sodium chloride infusion   Intravenous Continuous Tu, Royal Piedra T, DO 50 mL/hr at 02/24/20 1448 New Bag at 02/24/20 1448  . amitriptyline (ELAVIL) tablet 25 mg  25 mg Oral QHS Tu, Ching T, DO   25 mg at 02/24/20 2144  . aspirin EC tablet 81 mg  81 mg Oral Daily Tu, Ching T, DO   81 mg at 02/25/20 0941  . feeding supplement (ENSURE ENLIVE) (ENSURE ENLIVE) liquid 237 mL  237 mL  Oral BID BM Irene Pap N, DO   237 mL at 02/25/20 0941  . heparin injection 5,000 Units  5,000 Units Subcutaneous Q8H Tu, Ching T, DO   5,000 Units at 02/25/20 0605  . multivitamin with minerals tablet 1 tablet  1 tablet Oral Daily Irene Pap N, DO   1 tablet at 02/25/20 0941  . ondansetron (ZOFRAN) injection 4 mg  4 mg Intravenous Q6H PRN Tu, Ching T, DO   4 mg at 02/24/20 1232  . pantoprazole (PROTONIX) EC tablet 40 mg  40 mg Oral Daily Tu, Ching T, DO   40 mg at 02/25/20 0941  . senna-docusate (Senokot-S) tablet 2 tablet  2 tablet Oral BID Irene Pap N, DO   2 tablet at 02/25/20 0941  . tamsulosin (FLOMAX) capsule 0.4 mg  0.4 mg Oral QPC supper Tu, Ching T, DO   0.4 mg at 02/24/20 1724    Musculoskeletal: Strength & Muscle Tone: decreased Gait & Station: Unable to assess Patient leans: Unable to assess  Psychiatric Specialty Exam: Physical Exam  Nursing note and vitals reviewed. Constitutional: He is oriented to person, place, and time. He appears well-developed.  HENT:  Head: Normocephalic.  Cardiovascular: Normal rate.  Respiratory: Effort normal.  Neurological: He is alert and oriented to person, place, and time.  Psychiatric: He has a normal mood and affect. His behavior is normal. Judgment  and thought content normal.    Review of Systems  Constitutional: Negative.   HENT: Negative.   Eyes: Negative.   Respiratory: Negative.   Cardiovascular: Negative.   Gastrointestinal: Negative.   Genitourinary: Negative.   Musculoskeletal: Negative.   Skin: Negative.   Neurological: Negative.     Blood pressure 139/82, pulse 73, temperature 98.1 F (36.7 C), temperature source Oral, resp. rate 14, height 6\' 1"  (1.854 m), weight 62.3 kg, SpO2 99 %.Body mass index is 18.12 kg/m.  General Appearance: Fairly Groomed  Eye Contact:  Fair  Speech:  Clear and Coherent and Normal Rate  Volume:  Normal  Mood:  Euthymic  Affect:  Congruent  Thought Process:  Coherent, Goal Directed  and Descriptions of Associations: Intact  Orientation:  Full (Time, Place, and Person)  Thought Content:  Logical  Suicidal Thoughts:  No  Homicidal Thoughts:  No  Memory:  Immediate;   Good Recent;   Good Remote;   Good  Judgement:  Fair  Insight:  Good  Psychomotor Activity:  Normal  Concentration:  Concentration: Good and Attention Span: Good  Recall:  Good  Fund of Knowledge:  Good  Language:  Good  Akathisia:  No  Handed:  Right  AIMS (if indicated):     Assets:  Communication Skills Desire for Improvement Financial Resources/Insurance Housing Intimacy Leisure Time Physical Health  ADL's:  Impaired  Cognition:  WNL  Sleep:        Treatment Plan Summary: Medication management recommend discontinue Elavil, initiate Zoloft 25 mg daily by mouth to address depressed mood. Consider social work consult, patient reports concerns regarding lies at home alone.   Disposition: Patient does not meet criteria for psychiatric inpatient admission. Supportive therapy provided about ongoing stressors.  Emmaline Kluver, FNP 02/25/2020 12:26 PM

## 2020-02-25 NOTE — Progress Notes (Signed)
PROGRESS NOTE  Alvin Ingram JKK:[id number] DOB: 06/26/1936 DOA: 02/22/2020 PCP: Binnie Rail, MD  HPI/Recap of past 24 hours: Alvin Ingram is a 84 y.o. male with medical history significant for BPH, hyperlipidemia and GERD who presents with concerns of decreased p.o. intake and weakness. Patient overall not a good historian on account timeline.  He reports that family advised him to present to the ED but they have been concerned about his decreased appetite.  He is still able to tolerate some fruit and cereal but has not had much appetite with a more balanced meal for the past several weeks.  He is also noticed nausea and some occasional vomiting that might be worse with food.  He also occasionally notices abdominal pain.  Lower quadrant.  When he urinates he thinks he can hear his "stomach sloshing around."  He notes occasional rare dysuria.  He says that he has seen bright red blood once in a while when he wipes due to bowel movement.  Denies black tarry stool.  He denies any fever.  No night sweats.  Thinks he has lost about 25 pounds in the past several weeks.   He also noticed more balance issues and is afraid that he will fall whenever he gets up to urinate.  He feels that his right side is stronger than his left side.  Has been evaluated by his PCP about 2 weeks ago and had CBC showing mildly elevated AST of 40 and ALT of 59.  He also was noted to have a CT of his abdomen back in February that showed extensive sigmoid diverticulosis.  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  There was a recommendation for further evaluation with MRI with and without contrast on an outpatient basis.  Patient is a former tobacco user but quit about 40 years ago.  He denies alcohol or illicit drug use. Family history includes a brother with lung cancer but no other abdominal related cancer that he knows of.  Elevated  serum sodium, cr with AKI, prerenal in the setting of dehydration with poor oral intake.  Lost 30 to 40 pound weight loss in the last 1 month, has also had recurrent falls 3 times in the last 2 weeks.  02/25/20:  Admits to feeling depressed.  States has been on Elavil for 3-5 years, not controlling his depression.  Denies suicidal ideation.  Psych consulted.  Dc Elavil and start Zoloft as rec by psych.  Assessment/Plan: Principal Problem:   Failure to thrive in adult Active Problems:   AKI (acute kidney injury) ([place])   Transaminitis   Weakness   Hypernatremia   Protein-calorie malnutrition, severe   Failure to thrive in adult/ Severe weight loss Presented with poor oral intake, poor appetite weight loss 30-40 lbs in 1 month BMI 18 with evidence of muscle mass loss Dehydrated with electrolyte abnormalities, being repleted with IV hydration Dietary consult Encourage increasing protein calorie intake. May need placement, lives alone and cannot take care of himself. TOC consulted to assist with placement.  Uncontrolled depression Seen by psych Management per psych recs Start Zoloft, dc elavil.  AKI on CKD 3A Baseline creatinine appears to be 1.8 with GFR of 42 Presented with creatinine 3.1 with GFR of 20 Creatinine downtrending with IV fluid Continue IV fluid Cr 3.1 from 3.39 Monitor urine output Avoid nephrotoxins, hypotension and dehydration Repeat BMP in the morning  Acute  transaminitis, improving Unclear etiology Elevated alkaline phosphatase, AST and ALT, trending down T bili normal Right upper quadrant abdominal ultrasound 02/22/20 negative.  No gallstones or wall thickening visualized. No sonographic Murphy sign noted by sonographer. Acute hepatitis panel negative Avoid hepatotoxic agents Repeat CMP in the morning  15 mm low attenuating lesion with fluid attenuation along the posterior distal body of the pancreas  Obtain abd MRI to further assess  Hypovolemic  hypernatremia, improving Presented with serum sodium 153> 151>148 Continue 1/2 normal saline Repeat BMP this afternoon  Chronic diastolic CHF Hypervolemic on exam Last 2D echo done on 12/17/2019 showed LVEF 65-70% With grade 1 diastolic dysfunction Closely monitor volume status while on IV fluid  GERD Continue p.o. Protonix  BPH Continue Flomax  Ambulatory dysfunction with frequent falls 3 falls at home within the last 2 weeks per his niece Alvin Ingram in the bathroom hospital 3/29, denies hitting his head Denies feeling dizzy prior to falling PT recs home PTOT Lives alone, has not been able to appropriately take care of himself per his niece. Continue Fall precautions    DVT prophylaxis: Heparin SQ 3 times daily Code Status: Full Family Communication: Updated his niece via phone.  Disposition Plan: Patient is from home.  Anticipate discharge to SNF in the next 24 to 48 hours once electrolyte abnormalities have improved and MRI abd is completed.  TOC assisting with disposition.  Consults called: Psych.   Objective: Vitals:   02/24/20 0658 02/24/20 1352 02/24/20 2120 02/25/20 0527  BP: (!) 149/84 110/68 134/81 139/82  Pulse: 67 81 71 73  Resp: 16 15 14 14   Temp: 97.8 F (36.6 C) 97.8 F (36.6 C) 98 F (36.7 C) 98.1 F (36.7 C)  TempSrc: Oral Oral Oral Oral  SpO2: 98% 99% 100% 99%  Weight:      Height:        Intake/Output Summary (Last 24 hours) at 02/25/2020 1432 Last data filed at 02/25/2020 1107 Gross per 24 hour  Intake 337 ml  Output 50 ml  Net 287 ml   Filed Weights   02/22/20 1523 02/22/20 2122  Weight: 62.1 kg 62.3 kg    Exam:  . General: 84 y.o. year-old male frail-appearing no acute distress.  Alert and oriented x3. . Cardiovascular: Regular rate and rhythm no rubs or gallops.   Marland Kitchen Respiratory: Clear consultation no wheezes no rales.   . Abdomen: Soft mild discomfort on palpation of right upper quadrant.  Bowel sounds present. . Musculoskeletal: No  lower extremity edema bilaterally.   Marland Kitchen Psychiatry: Flat affect.  Data Reviewed: CBC: Recent Labs  Lab 02/22/20 1540 02/23/20 0521  WBC 5.0 5.5  HGB 12.1* 11.1*  HCT 37.4* 34.2*  MCV 102.2* 103.3*  PLT 150 161   Basic Metabolic Panel: Recent Labs  Lab 02/22/20 1540 02/23/20 0521 02/23/20 1709 02/24/20 0548 02/25/20 0533  NA 153* 154* 153* 151* 148*  K 4.1 4.1 4.1 4.1 4.3  CL 117* 121* 120* 119* 118*  CO2 27 23 24 24 24   GLUCOSE 114* 91 153* 100* 109*  BUN 92* 89* 90* 86* 87*  CREATININE 3.15* 2.99* 3.23* 3.39* 3.12*  CALCIUM 8.6* 8.5* 8.3* 8.3* 8.1*   GFR: Estimated Creatinine Clearance: 16.4 mL/min (A) (by C-G formula based on SCr of 3.12 mg/dL (H)). Liver Function Tests: Recent Labs  Lab 02/22/20 1540 02/24/20 0548  AST 175* 61*  ALT 253* 147*  ALKPHOS 161* 135*  BILITOT 0.7 0.7  PROT 4.9* 4.3*  ALBUMIN 2.3* 2.1*  Recent Labs  Lab 02/22/20 1540  LIPASE 20   No results for input(s): AMMONIA in the last 168 hours. Coagulation Profile: Recent Labs  Lab 02/22/20 2128  INR 0.9   Cardiac Enzymes: No results for input(s): CKTOTAL, CKMB, CKMBINDEX, TROPONINI in the last 168 hours. BNP (last 3 results) Recent Labs    12/29/19 1639 02/13/20 1431  PROBNP 224.0* 84.0   HbA1C: No results for input(s): HGBA1C in the last 72 hours. CBG: No results for input(s): GLUCAP in the last 168 hours. Lipid Profile: No results for input(s): CHOL, HDL, LDLCALC, TRIG, CHOLHDL, LDLDIRECT in the last 72 hours. Thyroid Function Tests: Recent Labs    02/22/20 1541  TSH 2.428   Anemia Panel: No results for input(s): VITAMINB12, FOLATE, FERRITIN, TIBC, IRON, RETICCTPCT in the last 72 hours. Urine analysis:    Component Value Date/Time   COLORURINE YELLOW 02/22/2020 1540   APPEARANCEUR CLEAR 02/22/2020 1540   LABSPEC 1.014 02/22/2020 1540   PHURINE 6.0 02/22/2020 1540   GLUCOSEU NEGATIVE 02/22/2020 1540   GLUCOSEU NEGATIVE 01/02/2020 1028   HGBUR NEGATIVE  02/22/2020 1540   BILIRUBINUR NEGATIVE 02/22/2020 1540   KETONESUR NEGATIVE 02/22/2020 1540   PROTEINUR >=300 (A) 02/22/2020 1540   UROBILINOGEN 0.2 01/02/2020 1028   NITRITE NEGATIVE 02/22/2020 1540   LEUKOCYTESUR NEGATIVE 02/22/2020 1540   Sepsis Labs: @LABRCNTIP (procalcitonin:4,lacticidven:4)  ) Recent Results (from the past 240 hour(s))  SARS CORONAVIRUS 2 (TAT 6-24 HRS) Nasopharyngeal Nasopharyngeal Swab     Status: None   Collection Time: 02/22/20  6:00 PM   Specimen: Nasopharyngeal Swab  Result Value Ref Range Status   SARS Coronavirus 2 NEGATIVE NEGATIVE Final    Comment: (NOTE) SARS-CoV-2 target nucleic acids are NOT DETECTED. The SARS-CoV-2 RNA is generally detectable in upper and lower respiratory specimens during the acute phase of infection. Negative results do not preclude SARS-CoV-2 infection, do not rule out co-infections with other pathogens, and should not be used as the sole basis for treatment or other patient management decisions. Negative results must be combined with clinical observations, patient history, and epidemiological information. The expected result is Negative. Fact Sheet for Patients: SugarRoll.be Fact Sheet for Healthcare Providers: https://www.woods-mathews.com/ This test is not yet approved or cleared by the [place] FDA and  has been authorized for detection and/or diagnosis of SARS-CoV-2 by FDA under an Emergency Use Authorization (EUA). This EUA will remain  in effect (meaning this test can be used) for the duration of the COVID-19 declaration under Section 56 4(b)(1) of the Act, 21 U.S.C. section 360bbb-3(b)(1), unless the authorization is terminated or revoked sooner. Performed at [facility], [place] [address], [place], [place] [address]       Studies: No results found.  Scheduled Meds: . aspirin EC  81 mg Oral Daily  . feeding supplement (ENSURE ENLIVE)  237 mL Oral BID BM    . heparin  5,000 Units Subcutaneous Q8H  . multivitamin with minerals  1 tablet Oral Daily  . pantoprazole  40 mg Oral Daily  . senna-docusate  2 tablet Oral BID  . sertraline  25 mg Oral Daily  . tamsulosin  0.4 mg Oral QPC supper    Continuous Infusions: . sodium chloride 50 mL/hr at 02/24/20 1448     LOS: 1 day     Kayleen Memos, MD [facility] Pager [phone]  If 7PM-7AM, please contact night-coverage [email or web address] Password [facility] 02/25/2020, 2:32 PM

## 2020-02-25 NOTE — TOC Initial Note (Signed)
Transition of Care [facility]) - Initial/Assessment Note    Patient Details  Name: Alvin Ingram MRN: [id number] Date of Birth: Feb 22, 1936  Transition [facility]) CM/SW Contact:    Lynnell Catalan, RN Phone Number: 02/25/2020, 3:10 PM  Clinical Narrative:                 This CM spoke with pt at bedside about dc planning. PT recommendations gone over with pt and he states that he is willing to go to SNF if he needs to. Permission granted to fax out FL2 to area facilities. TOC will fax out FL2 and present pt with bed offers when available.  Expected Discharge Plan: Skilled Nursing Facility Barriers to Discharge: Continued Medical Work up   Patient Goals and CMS Choice Patient states their goals for this hospitalization and ongoing recovery are:: to get better      Expected Discharge Plan and Services Expected Discharge Plan: [place]   Discharge Planning Services: CM Consult   Living arrangements for the past 2 months: Single Family Home                                      Prior Living Arrangements/Services Living arrangements for the past 2 months: Single Family Home Lives with:: Self Patient language and need for interpreter reviewed:: Yes        Need for Family Participation in Patient Care: Yes (Comment) Care giver support system in place?: Yes (comment)      Activities of Daily Living Home Assistive Devices/Equipment: Eyeglasses, Environmental consultant (specify type), Dentures (specify type)(upper/lower partial plates, 4 wheeled walker) ADL Screening (condition at time of admission) Patient's cognitive ability adequate to safely complete daily activities?: Yes Is the patient deaf or have difficulty hearing?: Yes(slight hoh) Does the patient have difficulty seeing, even when wearing glasses/contacts?: No Does the patient have difficulty concentrating, remembering, or making decisions?: No Patient able to express need for assistance with ADLs?: Yes Does the  patient have difficulty dressing or bathing?: Yes Independently performs ADLs?: No Communication: Independent Dressing (OT): Needs assistance Is this a change from baseline?: Change from baseline, expected to last >3 days Grooming: Needs assistance Is this a change from baseline?: Change from baseline, expected to last >3 days Feeding: Needs assistance Is this a change from baseline?: Change from baseline, expected to last >3 days Bathing: Needs assistance Is this a change from baseline?: Change from baseline, expected to last >3 days Toileting: Needs assistance Is this a change from baseline?: Pre-admission baseline In/Out Bed: Needs assistance Is this a change from baseline?: Pre-admission baseline Walks in Home: Needs assistance Is this a change from baseline?: Pre-admission baseline Does the patient have difficulty walking or climbing stairs?: Yes(secondary to weakness) Weakness of Legs: Both Weakness of Arms/Hands: None  Permission Sought/Granted Permission sought to share information with : Customer service manager                Emotional Assessment Appearance:: Appears stated age     Orientation: : Oriented to Self, Oriented to Place, Oriented to  Time, Oriented to Situation      Admission diagnosis:  Anorexia [R63.0] Failure to thrive in adult [R62.7] LFT elevation [R79.89] Patient Active Problem List   Diagnosis Date Noted  . Protein-calorie malnutrition, severe 02/24/2020  . Transaminitis 02/23/2020  . Weakness 02/23/2020  . Hypernatremia 02/23/2020  . Weight loss 02/13/2020  . Epigastric pain  02/13/2020  . Fatigue 02/13/2020  . Physical deconditioning 02/13/2020  . Failure to thrive in adult 02/13/2020  . Lower abdominal pain 01/02/2020  . Bilateral leg edema 12/29/2019  . Constipation 12/29/2019  . Syncope 12/16/2019  . AKI (acute kidney injury) ([place]) 12/16/2019  . Hyperglycemia 08/18/2019  . BPH (benign prostatic hyperplasia) 05/31/2010   . THROMBOCYTOPENIA, CHRONIC 05/24/2009  . PREMATURE ATRIAL CONTRACTIONS 05/24/2009  . GERD 05/21/2008  . Nocturia 05/21/2008  . Hyperlipidemia 04/23/2007  . History of colonic polyps 03/15/2007   PCP:  Binnie Rail, MD Pharmacy:   [facility], [place] [address] Phone: [phone] Fax: [phone]     Social Determinants of Health (SDOH) Interventions    Readmission Risk Interventions No flowsheet data found.

## 2020-02-25 NOTE — [place] FL2 (Addendum)
[place] MEDICAID FL2 LEVEL OF CARE SCREENING TOOL     IDENTIFICATION  Patient Name: Alvin Ingram Birthdate: 1936/05/09 Sex: male Admission Date (Current Location): 02/22/2020  [facility] and [place] Number:  Herbalist and Address:  [facility],  [place], [place]      Provider Number: [id number]  Attending Physician Name and Address:  Kayleen Memos, DO  Relative Name and Phone Number:       Current Level of Care: Hospital Recommended Level of Care: [place] Prior Approval Number:    Date Approved/Denied:   PASRR Number:   [id number]  Discharge Plan: SNF    Current Diagnoses: Patient Active Problem List   Diagnosis Date Noted  . Protein-calorie malnutrition, severe 02/24/2020  . Transaminitis 02/23/2020  . Weakness 02/23/2020  . Hypernatremia 02/23/2020  . Weight loss 02/13/2020  . Epigastric pain 02/13/2020  . Fatigue 02/13/2020  . Physical deconditioning 02/13/2020  . Failure to thrive in adult 02/13/2020  . Lower abdominal pain 01/02/2020  . Bilateral leg edema 12/29/2019  . Constipation 12/29/2019  . Syncope 12/16/2019  . AKI (acute kidney injury) ([place]) 12/16/2019  . Hyperglycemia 08/18/2019  . BPH (benign prostatic hyperplasia) 05/31/2010  . THROMBOCYTOPENIA, CHRONIC 05/24/2009  . PREMATURE ATRIAL CONTRACTIONS 05/24/2009  . GERD 05/21/2008  . Nocturia 05/21/2008  . Hyperlipidemia 04/23/2007  . History of colonic polyps 03/15/2007    Orientation RESPIRATION BLADDER Height & Weight     Self, Time, Situation, Place  Normal Continent Weight: 62.3 kg Height:  6\' 1"  (185.4 cm)  BEHAVIORAL SYMPTOMS/MOOD NEUROLOGICAL BOWEL NUTRITION STATUS      Continent Diet(Heart Healthy)  AMBULATORY STATUS COMMUNICATION OF NEEDS Skin   Limited Assist Verbally Normal                       Personal Care Assistance Level of Assistance  Bathing, Dressing Bathing Assistance: Maximum assistance   Dressing  Assistance: Maximum assistance     Functional Limitations Info             SPECIAL CARE FACTORS FREQUENCY  PT (By licensed PT), OT (By licensed OT)     PT Frequency: 5 x weekly OT Frequency: 5 x weekly            Contractures Contractures Info: Not present    Additional Factors Info  Code Status, Allergies Code Status Info: Full Allergies Info: No known drug allergies           Current Medications (02/25/2020):  This is the current hospital active medication list Current Facility-Administered Medications  Medication Dose Route Frequency Provider Last Rate Last Admin  . 0.45 % sodium chloride infusion   Intravenous Continuous Tu, Royal Piedra T, DO 50 mL/hr at 02/24/20 1448 New Bag at 02/24/20 1448  . aspirin EC tablet 81 mg  81 mg Oral Daily Tu, Ching T, DO   81 mg at 02/25/20 0941  . feeding supplement (ENSURE ENLIVE) (ENSURE ENLIVE) liquid 237 mL  237 mL Oral BID BM Irene Pap N, DO   237 mL at 02/25/20 0941  . heparin injection 5,000 Units  5,000 Units Subcutaneous Q8H Tu, Ching T, DO   5,000 Units at 02/25/20 1433  . multivitamin with minerals tablet 1 tablet  1 tablet Oral Daily Irene Pap N, DO   1 tablet at 02/25/20 0941  . ondansetron (ZOFRAN) injection 4 mg  4 mg Intravenous Q6H PRN Tu, Ching T, DO   4 mg  at 02/24/20 1232  . pantoprazole (PROTONIX) EC tablet 40 mg  40 mg Oral Daily Tu, Ching T, DO   40 mg at 02/25/20 0941  . senna-docusate (Senokot-S) tablet 2 tablet  2 tablet Oral BID Irene Pap N, DO   2 tablet at 02/25/20 0941  . sertraline (ZOLOFT) tablet 25 mg  25 mg Oral Daily Irene Pap N, DO      . tamsulosin (FLOMAX) capsule 0.4 mg  0.4 mg Oral QPC supper Tu, Ching T, DO   0.4 mg at 02/24/20 1724     Discharge Medications: Please see discharge summary for a list of discharge medications.  Relevant Imaging Results:  Relevant Lab Results:   Additional Information [id number]  [name], Marjie Skiff, RN

## 2020-02-26 LAB — BASIC METABOLIC PANEL
Anion gap: 5 (ref 5–15)
BUN: 78 mg/dL — ABNORMAL HIGH (ref 8–23)
CO2: 25 mmol/L (ref 22–32)
Calcium: 8.2 mg/dL — ABNORMAL LOW (ref 8.9–10.3)
Chloride: 118 mmol/L — ABNORMAL HIGH (ref 98–111)
Creatinine, Ser: 2.92 mg/dL — ABNORMAL HIGH (ref 0.61–1.24)
GFR calc Af Amer: 22 mL/min — ABNORMAL LOW (ref >60)
GFR calc non Af Amer: 19 mL/min — ABNORMAL LOW (ref >60)
Glucose, Bld: 106 mg/dL — ABNORMAL HIGH (ref 70–99)
Potassium: 4.3 mmol/L (ref 3.5–5.1)
Sodium: 148 mmol/L — ABNORMAL HIGH (ref 135–145)

## 2020-02-26 NOTE — TOC Progression Note (Signed)
Transition of Care [facility]) - Progression Note    Patient Details  Name: Alvin Ingram MRN: [id number] Date of Birth: 1936-09-16  [facility]) CM/SW Contact  Nevaya Nagele, Marjie Skiff, RN Phone Number: 02/26/2020, 2:54 PM  Clinical Narrative:    This CM spoke with pt at bedside about SNF bed offers. Pt defers decision to his niece Marquita. This CM contacted Marquita and gave bed offers. [facility] was chosen. [facility] liaison contacted to accept snf bed. Facility to start insurance auth with pt insurance. TOC will continue to follow.   Expected Discharge Plan: [place] Barriers to Discharge: Continued Medical Work up  Expected Discharge Plan and Services Expected Discharge Plan: [place]   Discharge Planning Services: CM Consult   Living arrangements for the past 2 months: Single Family Home                                       Social Determinants of Health (SDOH) Interventions    Readmission Risk Interventions No flowsheet data found.

## 2020-02-26 NOTE — Progress Notes (Signed)
Physical Therapy Treatment Patient Details Name: Alvin Ingram MRN: [id number] DOB: 26-Oct-1936 Today's Date: 02/26/2020    History of Present Illness Alvin Ingram is a 84 y.o. male with medical history significant for BPH, hyperlipidemia and GERD who presents with concerns of decreased p.o. intake and weakness.    PT Comments    General bed mobility comments: seated EOB on arrive with NT as pt just got back from the bathroom.  General transfer comment: Unable to stand from lower surface; required bed elevated.  Very tall.  Good use of B UE's to self assist.  Uncontrolled desend due to fatigue.  50% VC's on safety. General Gait Details: VERY unsteady gait and lean on walker for support.  VERY deconditioned.  Distance limited by MAX c/o fatigue "feeling hot".  Standing BP was 56/41.  RN called to assist pt to recliner.  Recliner BP 89/61.  Returned to bed BP 129/82 HR 75. Pt lives home alone and will need ST Rehab at SNF  Follow Up Recommendations  SNF     Equipment Recommendations  Rolling walker with 5" wheels    Recommendations for Other Services       Precautions / Restrictions Precautions Precautions: Fall Precaution Comments: monitor BP (orthostatic) Restrictions Weight Bearing Restrictions: No    Mobility  Bed Mobility Overal bed mobility: Needs Assistance Bed Mobility: Sit to Supine       Sit to supine: Min assist;HOB elevated   General bed mobility comments: seated EOB on arrive with NT as pt just got back from the bathroom  Transfers Overall transfer level: Needs assistance Equipment used: Rolling walker (2 wheeled);None Transfers: Sit to/from [employer] to Stand: Min assist Stand pivot transfers: Min assist;Mod assist       General transfer comment: Unable to stand from lower surface; required bed elevated.  Very tall.  Good use of B UE's to self assist.  Uncontrolled desend due to fatigue.  50% VC's on  safety.  Ambulation/Gait Ambulation/Gait assistance: Min assist Gait Distance (Feet): 28 Feet Assistive device: Rolling walker (2 wheeled) Gait Pattern/deviations: Step-through pattern;Decreased stride length;Shuffle Gait velocity: decreased   General Gait Details: VERY unsteady gait and lean on walker for support.  VERY deconditioned.  Distance limited by MAX c/o fatigue "feeling hot".  Standing BP was 56/41.  RN called to assist pt to recliner.  Recliner BP 89/61.  Returned to bed BP 129/82 HR 75.   Stairs             Wheelchair Mobility    Modified Rankin (Stroke Patients Only)       Balance                                            Cognition Arousal/Alertness: Awake/alert Behavior During Therapy: Agitated;Flat affect Overall Cognitive Status: Difficult to assess                                 General Comments: honary and requires Max encouragement.  Not willing to offer much information about how he feels.      Exercises      General Comments        Pertinent Vitals/Pain Pain Assessment: No/denies pain    Home Living  Prior Function            PT Goals (current goals can now be found in the care plan section) Progress towards PT goals: Progressing toward goals    Frequency    Min 3X/week      PT Plan Discharge plan needs to be updated    Co-evaluation              AM-PAC PT "6 Clicks" Mobility   Outcome Measure  Help needed turning from your back to your side while in a flat bed without using bedrails?: A Little Help needed moving from lying on your back to sitting on the side of a flat bed without using bedrails?: A Little Help needed moving to and from a bed to a chair (including a wheelchair)?: A Little Help needed standing up from a chair using your arms (e.g., wheelchair or bedside chair)?: A Little Help needed to walk in hospital room?: A Lot Help needed  climbing 3-5 steps with a railing? : A Lot 6 Click Score: 16    End of Session Equipment Utilized During Treatment: Gait belt Activity Tolerance: Treatment limited secondary to medical complications (Comment)(orthostatic) Patient left: in bed;with call bell/phone within reach;with bed alarm set Nurse Communication: Mobility status PT Visit Diagnosis: Unsteadiness on feet (R26.81);Muscle weakness (generalized) (M62.81);Difficulty in walking, not elsewhere classified (R26.2)     Time: 1436-1500 PT Time Calculation (min) (ACUTE ONLY): 24 min  Charges:  $Gait Training: 8-22 mins $Therapeutic Activity: 8-22 mins                     Rica Koyanagi  PTA Acute  Rehabilitation Services Pager      [phone] Office      [phone]

## 2020-02-26 NOTE — Progress Notes (Signed)
Occupational Therapy Treatment Patient Details Name: Alvin Ingram MRN: [id number] DOB: 07/13/1936 Today's Date: 02/26/2020    History of present illness Yeiden Frenkel is a 84 y.o. male with medical history significant for BPH, hyperlipidemia and GERD who presents with concerns of decreased p.o. intake and weakness.   OT comments  "Not today". Pt required encouragement ot participate with OT, then ambulated to bathroom, had a BM, completed grooming task at sink for 8 min with S then agreeable to sitting up in chair. Requires min A to tranfser from lower surface. High risk for falls. Recommend rehab at SNF. Will continue to follow acutely.  Follow Up Recommendations  SNF;Supervision/Assistance - 24 hour    Equipment Recommendations  Tub/shower seat    Recommendations for Other Services      Precautions / Restrictions Precautions Precautions: Fall       Mobility Bed Mobility Overal bed mobility: Needs Assistance       Supine to sit: Supervision;HOB elevated        Transfers Overall transfer level: Needs assistance   Transfers: Sit to/from Stand Sit to Stand: Min assist(from lower surface)              Balance Overall balance assessment: Needs assistance   Sitting balance-Leahy Scale: Good       Standing balance-Leahy Scale: Fair Standing balance comment: ablet o release RW with hsnads during grooing                           ADL either performed or assessed with clinical judgement   ADL Overall ADL's : Needs assistance/impaired     Grooming: Supervision/safety;Standing   Upper Body Bathing: Set up;Sitting   Lower Body Bathing: Min guard;Sit to/from stand           Toilet Transfer: Minimal assistance;Ambulation;RW;Comfort height toilet;Grab bars   Toileting- Clothing Manipulation and Hygiene: Supervision/safety;Sitting/lateral lean;Sit to/from stand       Functional mobility during ADLs: Minimal assistance;Rolling walker General ADL  Comments: Able to stand at sink x 8 min for grooming     Vision       Perception     Praxis      Cognition Arousal/Alertness: Awake/alert Behavior During Therapy: Agitated;Flat affect(at times) Overall Cognitive Status: No family/caregiver present to determine baseline cognitive functioning                                 General Comments: affect better and less agitated as session progressed        Exercises     Shoulder Instructions       General Comments      Pertinent Vitals/ Pain       Pain Assessment: No/denies pain  Home Living                                          Prior Functioning/Environment              Frequency  Min 2X/week        Progress Toward Goals  OT Goals(current goals can now be found in the care plan section)  Progress towards OT goals: Progressing toward goals  Acute Rehab OT Goals Patient Stated Goal: to not talk so much OT Goal Formulation: With patient Time For Goal Achievement: 03/09/20 Potential  to Achieve Goals: Good ADL Goals Pt Will Perform Grooming: with supervision;sitting;standing Pt Will Perform Lower Body Bathing: with supervision;sitting/lateral leans;sit to/from stand Pt Will Perform Upper Body Dressing: with modified independence;sitting Pt Will Perform Lower Body Dressing: with supervision;sit to/from stand;sitting/lateral leans Pt Will Transfer to Toilet: with supervision;ambulating Pt Will Perform Toileting - Clothing Manipulation and hygiene: with supervision;sit to/from stand;sitting/lateral leans  Plan Discharge plan needs to be updated    Co-evaluation                 AM-PAC OT "6 Clicks" Daily Activity     Outcome Measure   Help from another person eating meals?: None Help from another person taking care of personal grooming?: A Little Help from another person toileting, which includes using toliet, bedpan, or urinal?: A Little Help from another person  bathing (including washing, rinsing, drying)?: A Little Help from another person to put on and taking off regular upper body clothing?: A Little Help from another person to put on and taking off regular lower body clothing?: A Little 6 Click Score: 19    End of Session Equipment Utilized During Treatment: Rolling walker  OT Visit Diagnosis: Muscle weakness (generalized) (M62.81);Adult, failure to thrive (R62.7)   Activity Tolerance Patient tolerated treatment well   Patient Left in chair;with call bell/phone within reach;with chair alarm set   Nurse Communication Mobility status        Time: 1010-1040 OT Time Calculation (min): 30 min  Charges: OT General Charges $OT Visit: 1 Visit OT Treatments $Self Care/Home Management : 23-37 mins  Maurie Boettcher, OT/L   Acute OT Clinical Specialist [place] Pager [phone] Office [phone]    [facility] 02/26/2020, 10:51 AM

## 2020-02-26 NOTE — Progress Notes (Signed)
PROGRESS NOTE  Alvin Ingram KGU:[id number] DOB: 11-Feb-1936 DOA: 02/22/2020 PCP: Binnie Rail, MD  HPI/Recap of past 24 hours: Alvin Ingram is a 84 y.o. male with medical history significant for BPH, hyperlipidemia and GERD who presents with concerns of decreased p.o. intake and weakness. Patient overall not a good historian on account timeline.  He reports that family advised him to present to the ED but they have been concerned about his decreased appetite.  He is still able to tolerate some fruit and cereal but has not had much appetite with a more balanced meal for the past several weeks.  He is also noticed nausea and some occasional vomiting that might be worse with food.  He also occasionally notices abdominal pain.  Lower quadrant.  When he urinates he thinks he can hear his "stomach sloshing around."  He notes occasional rare dysuria.  He says that he has seen bright red blood once in a while when he wipes due to bowel movement.  Denies black tarry stool.  He denies any fever.  No night sweats.  Thinks he has lost about 25 pounds in the past several weeks.   He also noticed more balance issues and is afraid that he will fall whenever he gets up to urinate.  He feels that his right side is stronger than his left side.  Has been evaluated by his PCP about 2 weeks ago and had CBC showing mildly elevated AST of 40 and ALT of 59.  He also was noted to have a CT of his abdomen back in February that showed extensive sigmoid diverticulosis.  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  There was a recommendation for further evaluation with MRI with and without contrast on an outpatient basis.  Patient is a former tobacco user but quit about 40 years ago.  He denies alcohol or illicit drug use. Family history includes a brother with lung cancer but no other abdominal related cancer that he knows of.  Elevated  serum sodium, cr with AKI, prerenal in the setting of dehydration with poor oral intake.  Lost 30 to 40 pound weight loss in the last 1 month, has also had recurrent falls 3 times in the last 2 weeks.  02/25/20:  Admits to feeling depressed.  States has been on Elavil for 3-5 years, not controlling his depression.  Denies suicidal ideation.  Psych consulted.  Dc Elavil and start Zoloft as rec by psych.  02/26/20: feels better.  No specific complaints.  Receptive to SNF.  TOC assisting with SNF placement.  Assessment/Plan: Principal Problem:   Failure to thrive in adult Active Problems:   AKI (acute kidney injury) ([place])   Transaminitis   Weakness   Hypernatremia   Protein-calorie malnutrition, severe   Failure to thrive in adult/ Severe weight loss Presented with poor oral intake, poor appetite weight loss 30-40 lbs in 1 month BMI 18 with evidence of muscle mass loss Dehydrated with electrolyte abnormalities, being repleted with IV hydration Dietary consult Encourage increasing protein calorie intake. May need placement, lives alone and cannot take care of himself. TOC consulted to assist with placement.  Uncontrolled depression Seen by psych Management per psych recs Continue Zoloft dced elavil 3/31.  AKI on CKD 3A, improving Baseline creatinine appears to be 1.8 with GFR of 42 Presented with creatinine 3.1 with GFR of 20 Creatinine downtrending with IV fluid Continue IV  fluid Cr 2.9 from 3.1 from 3.39 Monitor urine output Avoid nephrotoxins, hypotension and dehydration Repeat BMP in the morning  Acute transaminitis, improving Unclear etiology Elevated alkaline phosphatase, AST and ALT, trending down T bili normal Right upper quadrant abdominal ultrasound 02/22/20 negative.  No gallstones or wall thickening visualized. No sonographic Murphy sign noted by sonographer. Acute hepatitis panel negative Avoid hepatotoxic agents Repeat CMP in the morning  15 mm low  attenuating lesion with fluid attenuation along the posterior distal body of the pancreas  MRI shows cystic lesion of the pancreas, recommend surveillance wit repeat imagings in 2 years.  B/L renal cysts Surveillance outpatient  Resolved Hypovolemic hypernatremia with iV fluid Presented with serum sodium 153> 151>148 Continue 1/2 normal saline Repeat BMP this afternoon  Chronic diastolic CHF Hypervolemic on exam Last 2D echo done on 12/17/2019 showed LVEF 65-70% With grade 1 diastolic dysfunction Closely monitor volume status while on IV fluid  GERD Continue p.o. Protonix  BPH Continue Flomax  Ambulatory dysfunction with frequent falls 3 falls at home within the last 2 weeks per his niece [name] in the bathroom hospital 3/29, denies hitting his head Denies feeling dizzy prior to falling Lives alone, has not been able to appropriately take care of himself per his niece. Continue Fall precautions TOC assisting with placement    DVT prophylaxis: Heparin SQ 3 times daily Code Status: Full Family Communication: Updated his niece via phone.  Disposition Plan: Patient is from home.  Anticipate discharge to SNF in the next 24 to 48 hours once bed is available.  TOC assisting with SNF placement.  Consults called: Psych.   Objective: Vitals:   02/25/20 2028 02/26/20 0500 02/26/20 0528 02/26/20 1325  BP: (!) 154/98  (!) 144/80 111/64  Pulse: 78  71 77  Resp: 14  14 15   Temp: 98.1 F (36.7 C)  (!) 97.5 F (36.4 C) 98 F (36.7 C)  TempSrc: Oral  Oral Oral  SpO2: 99%  99% 99%  Weight:  63.5 kg    Height:        Intake/Output Summary (Last 24 hours) at 02/26/2020 1504 Last data filed at 02/26/2020 1300 Gross per 24 hour  Intake 865 ml  Output 600 ml  Net 265 ml   Filed Weights   02/22/20 1523 02/22/20 2122 02/26/20 0500  Weight: 62.1 kg 62.3 kg 63.5 kg    Exam:  . General: 84 y.o. year-old male Frail appearing in NAD. A&O x 3 . Cardiovascular: Regular rate and  rhythm no rubs or gallops.   Marland Kitchen Respiratory: Clear auscultation no wheezes no rales.   . Abdomen: Soft nontender normal bowel sounds present.   . Musculoskeletal: No lower extremity edema bilaterally. Marland Kitchen Psychiatry: Mood is appropriate for condition and setting.  Data Reviewed: CBC: Recent Labs  Lab 02/22/20 1540 02/23/20 0521  WBC 5.0 5.5  HGB 12.1* 11.1*  HCT 37.4* 34.2*  MCV 102.2* 103.3*  PLT 150 947   Basic Metabolic Panel: Recent Labs  Lab 02/23/20 0521 02/23/20 1709 02/24/20 0548 02/25/20 0533 02/26/20 0457  NA 154* 153* 151* 148* 148*  K 4.1 4.1 4.1 4.3 4.3  CL 121* 120* 119* 118* 118*  CO2 23 24 24 24 25   GLUCOSE 91 153* 100* 109* 106*  BUN 89* 90* 86* 87* 78*  CREATININE 2.99* 3.23* 3.39* 3.12* 2.92*  CALCIUM 8.5* 8.3* 8.3* 8.1* 8.2*   GFR: Estimated Creatinine Clearance: 17.8 mL/min (A) (by C-G formula based on SCr of 2.92 mg/dL (H)). Liver  Function Tests: Recent Labs  Lab 02/22/20 1540 02/24/20 0548  AST 175* 61*  ALT 253* 147*  ALKPHOS 161* 135*  BILITOT 0.7 0.7  PROT 4.9* 4.3*  ALBUMIN 2.3* 2.1*   Recent Labs  Lab 02/22/20 1540  LIPASE 20   No results for input(s): AMMONIA in the last 168 hours. Coagulation Profile: Recent Labs  Lab 02/22/20 2128  INR 0.9   Cardiac Enzymes: No results for input(s): CKTOTAL, CKMB, CKMBINDEX, TROPONINI in the last 168 hours. BNP (last 3 results) Recent Labs    12/29/19 1639 02/13/20 1431  PROBNP 224.0* 84.0   HbA1C: No results for input(s): HGBA1C in the last 72 hours. CBG: No results for input(s): GLUCAP in the last 168 hours. Lipid Profile: No results for input(s): CHOL, HDL, LDLCALC, TRIG, CHOLHDL, LDLDIRECT in the last 72 hours. Thyroid Function Tests: No results for input(s): TSH, T4TOTAL, FREET4, T3FREE, THYROIDAB in the last 72 hours. Anemia Panel: No results for input(s): VITAMINB12, FOLATE, FERRITIN, TIBC, IRON, RETICCTPCT in the last 72 hours. Urine analysis:    Component Value  Date/Time   COLORURINE YELLOW 02/22/2020 1540   APPEARANCEUR CLEAR 02/22/2020 1540   LABSPEC 1.014 02/22/2020 1540   PHURINE 6.0 02/22/2020 1540   GLUCOSEU NEGATIVE 02/22/2020 1540   GLUCOSEU NEGATIVE 01/02/2020 1028   HGBUR NEGATIVE 02/22/2020 1540   BILIRUBINUR NEGATIVE 02/22/2020 1540   KETONESUR NEGATIVE 02/22/2020 1540   PROTEINUR >=300 (A) 02/22/2020 1540   UROBILINOGEN 0.2 01/02/2020 1028   NITRITE NEGATIVE 02/22/2020 1540   LEUKOCYTESUR NEGATIVE 02/22/2020 1540   Sepsis Labs: @LABRCNTIP (procalcitonin:4,lacticidven:4)  ) Recent Results (from the past 240 hour(s))  SARS CORONAVIRUS 2 (TAT 6-24 HRS) Nasopharyngeal Nasopharyngeal Swab     Status: None   Collection Time: 02/22/20  6:00 PM   Specimen: Nasopharyngeal Swab  Result Value Ref Range Status   SARS Coronavirus 2 NEGATIVE NEGATIVE Final    Comment: (NOTE) SARS-CoV-2 target nucleic acids are NOT DETECTED. The SARS-CoV-2 RNA is generally detectable in upper and lower respiratory specimens during the acute phase of infection. Negative results do not preclude SARS-CoV-2 infection, do not rule out co-infections with other pathogens, and should not be used as the sole basis for treatment or other patient management decisions. Negative results must be combined with clinical observations, patient history, and epidemiological information. The expected result is Negative. Fact Sheet for Patients: SugarRoll.be Fact Sheet for Healthcare Providers: https://www.woods-mathews.com/ This test is not yet approved or cleared by the [place] FDA and  has been authorized for detection and/or diagnosis of SARS-CoV-2 by FDA under an Emergency Use Authorization (EUA). This EUA will remain  in effect (meaning this test can be used) for the duration of the COVID-19 declaration under Section 56 4(b)(1) of the Act, 21 U.S.C. section 360bbb-3(b)(1), unless the authorization is terminated  or revoked sooner. Performed at [facility], [place] [address], [place], [place] [address]       Studies: MR ABDOMEN WO CONTRAST  Result Date: 02/25/2020 CLINICAL DATA:  Pancreatic lesion on CT EXAM: MRI ABDOMEN WITHOUT CONTRAST TECHNIQUE: Multiplanar multisequence MR imaging was performed without the administration of intravenous contrast. COMPARISON:  CT abdomen/pelvis dated 01/02/2020 FINDINGS: Limited evaluation due to motion degradation and lack of intravenous contrast administration. Lower chest: Lung bases are clear. Hepatobiliary: Unenhanced liver is unremarkable. No morphologic findings of cirrhosis. No hepatic steatosis. Gallbladder is unremarkable. No intrahepatic or extrahepatic ductal dilatation. Pancreas: 8 mm unilocular cyst along the proximal pancreatic tail (series 3/image 8), likely benign. Additional  14 mm unilocular cyst at the junction of the pancreatic tail and anterior left kidney (series 5/image 23). While the origin is indeterminate on the current study, this favors a pancreatic cyst on prior CT. No pancreatic atrophy or ductal dilatation. Spleen:  Within normal limits. Adrenals/Urinary Tract:  Adrenal glands are within normal limits. Multiple bilateral renal cysts, measuring up to 3.8 cm in the anterior left lower kidney (series 5/image 29), simple/benign on MRI. No hydronephrosis. Stomach/Bowel: Stomach is within normal limits. Visualized bowel is unremarkable, noting sigmoid diverticulosis. Vascular/Lymphatic:  No evidence of abdominal aortic aneurysm. No suspicious abdominal lymphadenopathy. Other:  No abdominal ascites. Musculoskeletal: No focal osseous lesions. IMPRESSION: Limited evaluation due to motion degradation and lack of intravenous contrast administration. Two unilocular pancreatic tail cysts measuring up to 14 mm, likely benign. Given patient's age, it is unclear that follow-up is warranted. However, if desired, initial follow-up CT or MRI abdomen with/without  contrast is suggested in 2 years. This recommendation follows ACR consensus guidelines: Management of Incidental Pancreatic Cysts: A [place] Paper of the ACR Incidental Findings Committee. [place] 1962;22:[id number]. Electronically Signed   By: Julian Hy M.D.   On: 02/25/2020 20:50    Scheduled Meds: . aspirin EC  81 mg Oral Daily  . feeding supplement (ENSURE ENLIVE)  237 mL Oral BID BM  . heparin  5,000 Units Subcutaneous Q8H  . multivitamin with minerals  1 tablet Oral Daily  . pantoprazole  40 mg Oral Daily  . senna-docusate  2 tablet Oral BID  . sertraline  25 mg Oral Daily  . tamsulosin  0.4 mg Oral QPC supper    Continuous Infusions: . sodium chloride 50 mL/hr at 02/24/20 1448     LOS: 2 days     Kayleen Memos, MD [facility] Pager [phone]  If 7PM-7AM, please contact night-coverage [email or web address] Password [email or web address] 02/26/2020, 3:04 PM

## 2020-02-27 LAB — BASIC METABOLIC PANEL
Anion gap: 7 (ref 5–15)
BUN: 78 mg/dL — ABNORMAL HIGH (ref 8–23)
CO2: 24 mmol/L (ref 22–32)
Calcium: 8.3 mg/dL — ABNORMAL LOW (ref 8.9–10.3)
Chloride: 118 mmol/L — ABNORMAL HIGH (ref 98–111)
Creatinine, Ser: 2.91 mg/dL — ABNORMAL HIGH (ref 0.61–1.24)
GFR calc Af Amer: 22 mL/min — ABNORMAL LOW (ref >60)
GFR calc non Af Amer: 19 mL/min — ABNORMAL LOW (ref >60)
Glucose, Bld: 101 mg/dL — ABNORMAL HIGH (ref 70–99)
Potassium: 4.3 mmol/L (ref 3.5–5.1)
Sodium: 149 mmol/L — ABNORMAL HIGH (ref 135–145)

## 2020-02-27 MED ORDER — BISACODYL 10 MG RE SUPP
10.0000 mg | Freq: Once | RECTAL | Status: AC
  Administered 2020-02-27: 10 mg via RECTAL
  Filled 2020-02-27: qty 1

## 2020-02-27 MED ORDER — POLYETHYLENE GLYCOL 3350 17 G PO PACK
17.0000 g | PACK | Freq: Two times a day (BID) | ORAL | Status: DC
  Administered 2020-02-27 – 2020-03-17 (×12): 17 g via ORAL
  Filled 2020-02-27 (×22): qty 1

## 2020-02-27 NOTE — Progress Notes (Signed)
PROGRESS NOTE  Alvin Ingram WUJ:[id number] DOB: 03/30/1936 DOA: 02/22/2020 PCP: Binnie Rail, MD  HPI/Recap of past 24 hours: Alvin Ingram is a 84 y.o. male with medical history significant for BPH, hyperlipidemia and GERD who presents with concerns of decreased p.o. intake and weakness. Patient overall not a good historian on account timeline.  He reports that family advised him to present to the ED but they have been concerned about his decreased appetite.  He is still able to tolerate some fruit and cereal but has not had much appetite with a more balanced meal for the past several weeks.  He is also noticed nausea and some occasional vomiting that might be worse with food.  He also occasionally notices abdominal pain.  Lower quadrant.  When he urinates he thinks he can hear his "stomach sloshing around."  He notes occasional rare dysuria.  He says that he has seen bright red blood once in a while when he wipes due to bowel movement.  Denies black tarry stool.  He denies any fever.  No night sweats.  Thinks he has lost about 25 pounds in the past several weeks.   He also noticed more balance issues and is afraid that he will fall whenever he gets up to urinate.  He feels that his right side is stronger than his left side.  Has been evaluated by his PCP about 2 weeks ago and had CBC showing mildly elevated AST of 40 and ALT of 59.  He also was noted to have a CT of his abdomen back in February that showed extensive sigmoid diverticulosis.  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  There was a recommendation for further evaluation with MRI with and without contrast on an outpatient basis.  Patient is a former tobacco user but quit about 40 years ago.  He denies alcohol or illicit drug use. Family history includes a brother with lung cancer but no other abdominal related cancer that he knows of.  Elevated  serum sodium, cr with AKI, prerenal in the setting of dehydration with poor oral intake.  Lost 30 to 40 pound weight loss in the last 1 month, has also had recurrent falls 3 times in the last 2 weeks.  02/25/20:  Admits to feeling depressed.  States has been on Elavil for 3-5 years, not controlling his depression.  Denies suicidal ideation.  Psych consulted.  Dc Elavil and start Zoloft as rec by psych.  02/27/20: No new complaints.  Working on placement.    Assessment/Plan: Principal Problem:   Failure to thrive in adult Active Problems:   AKI (acute kidney injury) ([place])   Transaminitis   Weakness   Hypernatremia   Protein-calorie malnutrition, severe   Failure to thrive in adult/ Severe weight loss Presented with poor oral intake, poor appetite weight loss 30-40 lbs in 1 month BMI 18 with evidence of muscle mass loss Dehydrated with electrolyte abnormalities, being repleted with IV hydration Dietary consult Encourage increasing protein calorie intake. TOC assisting with SNF placement.  Uncontrolled depression Seen by psych Management per psych recs Continue Zoloft dced elavil 3/31.  AKI on CKD 3A, improving Baseline creatinine appears to be 1.8 with GFR of 42 Presented with creatinine 3.1 with GFR of 20 Creatinine downtrending with IV fluid Continue IV fluid Cr 2.9 from 3.1 from 3.39 Monitor urine output Continue to avoid nephrotoxins, hypotension and dehydration Repeat BMP  in the morning  Acute transaminitis, improving Unclear etiology Elevated alkaline phosphatase, AST and ALT, trending down T bili normal Right upper quadrant abdominal ultrasound 02/22/20 negative.  No gallstones or wall thickening visualized. No sonographic Murphy sign noted by sonographer. Acute hepatitis panel negative Avoid hepatotoxic agents Repeat CMP tomorrow  15 mm low attenuating lesion with fluid attenuation along the posterior distal body of the pancreas  MRI shows cystic lesion of  the pancreas, recommend surveillance with repeat imagings in 2 years.  B/L renal cysts Surveillance outpatient  Resolved Hypovolemic hypernatremia with iV fluid Presented with serum sodium 153> 151>148 Continue 1/2 normal saline Repeat BMP this afternoon  Chronic diastolic CHF Hypervolemic on exam Last 2D echo done on 12/17/2019 showed LVEF 65-70% With grade 1 diastolic dysfunction Closely monitor volume status while on IV fluid  GERD Continue p.o. Protonix  BPH Continue Flomax  Ambulatory dysfunction with frequent falls 3 falls at home within the last 2 weeks per his niece [name] in the bathroom hospital 3/29, denies hitting his head Denies feeling dizzy prior to falling Lives alone, has not been able to appropriately take care of himself per his niece. Continue PT OT with assistance and fall precautions. TOC assisting with placement    DVT prophylaxis: Heparin SQ 3 times daily Code Status: Full Family Communication: Updated his niece via phone.  Disposition Plan: Patient is from home.  Anticipate discharge to SNF when bed is available.  Consults called: Psych.   Objective: Vitals:   02/26/20 0528 02/26/20 1325 02/26/20 2159 02/27/20 0616  BP: (!) 144/80 111/64 124/76 133/78  Pulse: 71 77 72 73  Resp: 14 15 14 14   Temp: (!) 97.5 F (36.4 C) 98 F (36.7 C) 97.9 F (36.6 C) 98.5 F (36.9 C)  TempSrc: Oral Oral Oral Oral  SpO2: 99% 99% 99% 99%  Weight:      Height:        Intake/Output Summary (Last 24 hours) at 02/27/2020 1347 Last data filed at 02/27/2020 0432 Gross per 24 hour  Intake 220 ml  Output 900 ml  Net -680 ml   Filed Weights   02/22/20 1523 02/22/20 2122 02/26/20 0500  Weight: 62.1 kg 62.3 kg 63.5 kg    Exam: No changes from prior exam.  . General: 84 y.o. year-old male Frail appearing in NAD. A&O x 3 . Cardiovascular: Regular rate and rhythm no rubs or gallops.   Marland Kitchen Respiratory: Clear auscultation no wheezes no rales.   . Abdomen: Soft  nontender normal bowel sounds present.   . Musculoskeletal: No lower extremity edema bilaterally. Marland Kitchen Psychiatry: Mood is appropriate for condition and setting.  Data Reviewed: CBC: Recent Labs  Lab 02/22/20 1540 02/23/20 0521  WBC 5.0 5.5  HGB 12.1* 11.1*  HCT 37.4* 34.2*  MCV 102.2* 103.3*  PLT 150 675   Basic Metabolic Panel: Recent Labs  Lab 02/23/20 1709 02/24/20 0548 02/25/20 0533 02/26/20 0457 02/27/20 0502  NA 153* 151* 148* 148* 149*  K 4.1 4.1 4.3 4.3 4.3  CL 120* 119* 118* 118* 118*  CO2 24 24 24 25 24   GLUCOSE 153* 100* 109* 106* 101*  BUN 90* 86* 87* 78* 78*  CREATININE 3.23* 3.39* 3.12* 2.92* 2.91*  CALCIUM 8.3* 8.3* 8.1* 8.2* 8.3*   GFR: Estimated Creatinine Clearance: 17.9 mL/min (A) (by C-G formula based on SCr of 2.91 mg/dL (H)). Liver Function Tests: Recent Labs  Lab 02/22/20 1540 02/24/20 0548  AST 175* 61*  ALT 253* 147*  ALKPHOS 161*  135*  BILITOT 0.7 0.7  PROT 4.9* 4.3*  ALBUMIN 2.3* 2.1*   Recent Labs  Lab 02/22/20 1540  LIPASE 20   No results for input(s): AMMONIA in the last 168 hours. Coagulation Profile: Recent Labs  Lab 02/22/20 2128  INR 0.9   Cardiac Enzymes: No results for input(s): CKTOTAL, CKMB, CKMBINDEX, TROPONINI in the last 168 hours. BNP (last 3 results) Recent Labs    12/29/19 1639 02/13/20 1431  PROBNP 224.0* 84.0   HbA1C: No results for input(s): HGBA1C in the last 72 hours. CBG: No results for input(s): GLUCAP in the last 168 hours. Lipid Profile: No results for input(s): CHOL, HDL, LDLCALC, TRIG, CHOLHDL, LDLDIRECT in the last 72 hours. Thyroid Function Tests: No results for input(s): TSH, T4TOTAL, FREET4, T3FREE, THYROIDAB in the last 72 hours. Anemia Panel: No results for input(s): VITAMINB12, FOLATE, FERRITIN, TIBC, IRON, RETICCTPCT in the last 72 hours. Urine analysis:    Component Value Date/Time   COLORURINE YELLOW 02/22/2020 1540   APPEARANCEUR CLEAR 02/22/2020 1540   LABSPEC 1.014  02/22/2020 1540   PHURINE 6.0 02/22/2020 1540   GLUCOSEU NEGATIVE 02/22/2020 1540   GLUCOSEU NEGATIVE 01/02/2020 1028   HGBUR NEGATIVE 02/22/2020 1540   BILIRUBINUR NEGATIVE 02/22/2020 1540   KETONESUR NEGATIVE 02/22/2020 1540   PROTEINUR >=300 (A) 02/22/2020 1540   UROBILINOGEN 0.2 01/02/2020 1028   NITRITE NEGATIVE 02/22/2020 1540   LEUKOCYTESUR NEGATIVE 02/22/2020 1540   Sepsis Labs: @LABRCNTIP (procalcitonin:4,lacticidven:4)  ) Recent Results (from the past 240 hour(s))  SARS CORONAVIRUS 2 (TAT 6-24 HRS) Nasopharyngeal Nasopharyngeal Swab     Status: None   Collection Time: 02/22/20  6:00 PM   Specimen: Nasopharyngeal Swab  Result Value Ref Range Status   SARS Coronavirus 2 NEGATIVE NEGATIVE Final    Comment: (NOTE) SARS-CoV-2 target nucleic acids are NOT DETECTED. The SARS-CoV-2 RNA is generally detectable in upper and lower respiratory specimens during the acute phase of infection. Negative results do not preclude SARS-CoV-2 infection, do not rule out co-infections with other pathogens, and should not be used as the sole basis for treatment or other patient management decisions. Negative results must be combined with clinical observations, patient history, and epidemiological information. The expected result is Negative. Fact Sheet for Patients: SugarRoll.be Fact Sheet for Healthcare Providers: https://www.woods-mathews.com/ This test is not yet approved or cleared by the [place] FDA and  has been authorized for detection and/or diagnosis of SARS-CoV-2 by FDA under an Emergency Use Authorization (EUA). This EUA will remain  in effect (meaning this test can be used) for the duration of the COVID-19 declaration under Section 56 4(b)(1) of the Act, 21 U.S.C. section 360bbb-3(b)(1), unless the authorization is terminated or revoked sooner. Performed at [facility], [place] [address], [place], [place] [address]        Studies: No results found.  Scheduled Meds: . aspirin EC  81 mg Oral Daily  . feeding supplement (ENSURE ENLIVE)  237 mL Oral BID BM  . heparin  5,000 Units Subcutaneous Q8H  . multivitamin with minerals  1 tablet Oral Daily  . pantoprazole  40 mg Oral Daily  . polyethylene glycol  17 g Oral BID  . senna-docusate  2 tablet Oral BID  . sertraline  25 mg Oral Daily  . tamsulosin  0.4 mg Oral QPC supper    Continuous Infusions: . sodium chloride 50 mL/hr at 02/26/20 2330     LOS: 3 days     Kayleen Memos, MD [facility] Pager  [phone]  If 7PM-7AM, please contact night-coverage [email or web address] Password [email or web address] 02/27/2020, 1:47 PM

## 2020-02-28 ENCOUNTER — Encounter (HOSPITAL_COMMUNITY): Payer: Self-pay | Admitting: Internal Medicine

## 2020-02-28 LAB — HEPATIC FUNCTION PANEL
ALT: 116 U/L — ABNORMAL HIGH (ref 0–44)
AST: 56 U/L — ABNORMAL HIGH (ref 15–41)
Albumin: 2 g/dL — ABNORMAL LOW (ref 3.5–5.0)
Alkaline Phosphatase: 145 U/L — ABNORMAL HIGH (ref 38–126)
Bilirubin, Direct: <0.1 mg/dL (ref 0.0–0.2)
Total Bilirubin: 0.7 mg/dL (ref 0.3–1.2)
Total Protein: 4.3 g/dL — ABNORMAL LOW (ref 6.5–8.1)

## 2020-02-28 LAB — SARS CORONAVIRUS 2 (TAT 6-24 HRS): SARS Coronavirus 2: NEGATIVE

## 2020-02-28 LAB — LIPASE, BLOOD: Lipase: 19 U/L (ref 11–51)

## 2020-02-28 MED ORDER — ENSURE ENLIVE PO LIQD: 237.0000 mL | Freq: Two times a day (BID) | ORAL | 0 refills | Status: AC

## 2020-02-28 MED ORDER — DEXTROSE-NACL 5-0.45 % IV SOLN: INTRAVENOUS | Status: DC

## 2020-02-28 MED ORDER — SERTRALINE HCL 25 MG PO TABS: 25.0000 mg | ORAL_TABLET | Freq: Every day | ORAL | 0 refills | Status: DC

## 2020-02-28 MED ORDER — BOOST / RESOURCE BREEZE PO LIQD CUSTOM
1.0000 | Freq: Three times a day (TID) | ORAL | Status: DC
  Administered 2020-03-02 – 2020-03-07 (×12): 1 via ORAL

## 2020-02-28 MED ORDER — PROMETHAZINE HCL 25 MG/ML IJ SOLN
12.5000 mg | Freq: Four times a day (QID) | INTRAMUSCULAR | Status: DC | PRN
  Administered 2020-02-28 – 2020-02-29 (×2): 12.5 mg via INTRAVENOUS
  Filled 2020-02-28 (×2): qty 1

## 2020-02-28 NOTE — Discharge Summary (Signed)
Discharge Summary  Alvin Ingram ZTI:[id number] DOB: 12-Sep-1936  PCP: Binnie Rail, MD  Admit date: 02/22/2020 Discharge date: 02/28/2020  Time spent: 35 minutes.  Recommendations for Outpatient Follow-up:  1. Follow-up with your PCP in 1-2 weeks 2. Follow-up with urology in 1-2 weeks 3. Follow-up with psychiatry in 1-2 weeks  4. Take your medications as prescribed 5. Continue PT OT with assistance and fall precautions.  Discharge Diagnoses:  Active Hospital Problems   Diagnosis Date Noted  . Failure to thrive in adult 02/13/2020  . Protein-calorie malnutrition, severe 02/24/2020  . Transaminitis 02/23/2020  . Weakness 02/23/2020  . Hypernatremia 02/23/2020  . AKI (acute kidney injury) ([place]) 12/16/2019    Resolved Hospital Problems  No resolved problems to display.    Discharge Condition: Stable  Diet recommendation: Resume previous diet  Vitals:   02/27/20 2111 02/28/20 0619  BP: 121/70 136/82  Pulse: 71 74  Resp: 20 20  Temp: 98.1 F (36.7 C) 97.9 F (36.6 C)  SpO2: 100% 100%    History of present illness:  Alvin Ingram a 84 y.o.malewith medical history significant forBPH followed by urology, hyperlipidemia and GERD who presents to the ED as advised by his niece with concerns for decreased p.o. intake, suspected uncontrolled depression, weight loss, and generalized weakness.  Evaluated by his PCP about 2 weeks prior.  Labs showing mildly elevated LFTs.   On Elavil for depression which can cause elevation in LFTs.  A CT of his abdomen in February (01/02/20) showed extensive sigmoid diverticulosis and a 15 mm hypodense lesion of the distal body of the pancreas that was indeterminate.  MRI abdomen (02/25/20) showed a cystic lesion of the pancreas, with radiology recommendation for surveillance in 2 years.   Spoke with his niece via phone.  She is concerned that he might be depressed, recently lost a friend.  On Elavil for depression.  Admitted to feeling depressed  without suicidal or homicidal ideation.  Seen by psych with recommendation to DC Elavil and start Zoloft.  Appetite is slowly improving.  Evaluated by PT OT with recommendation for SNF with 24-hour assistance/supervision.  Patient is receptive to going to SNF.  02/28/20: Seen and examined.  No acute events overnight.  He has no new complaints this morning.  Vital signs and labs reviewed and are stable.  He will need to follow-up with his primary care provider, urology, and psych post hospitalization.    Hospital Course:  Principal Problem:   Failure to thrive in adult Active Problems:   AKI (acute kidney injury) ([place])   Transaminitis   Weakness   Hypernatremia   Protein-calorie malnutrition, severe  Failure to thrive in adult/ Severe weight loss/ Depression Presented with poor oral intake, poor appetite, weight loss, uncontrolled depression Improving with change in psych medication Elavil DCed and started on Zoloft 02/25/20. He had excessive sedation on Elavil and elevation in his LFTs Continue to Encourage increase in oral protein calorie intake Follow up with psych outpatient  AKI on CKD 3B, suspect prerenal in the setting of poor oral intake and dehydration Presented with creatinine 3.24 Baseline creatinine 2 with GFR 38 Creatinine downtrending to 2.9 Continue to avoid nephrotoxins and dehydration  Acute transaminitis in the setting of Elavil Possibly iatrogenic, Elavil can increase LFTs Elevated alkaline phosphatase, AST and ALT, trending down T bili normal Right upper quadrant abdominal ultrasound 02/22/20 negative.  No gallstones or wall thickening visualized. No sonographic Murphy sign noted by sonographer. Acute hepatitis panel negative Continue to avoid hepatotoxic  agents  15 mm low attenuating lesion with fluid attenuation along the posterior distal body of the pancreas/ B/L renal cysts MRI shows cystic lesion of the pancreas, recommend surveillance with repeat  imagings in 2 years.  Resolved Hypovolemic hypernatremia with iV fluid Presented with serum sodium 153> 151>148 Avoid dehydration Encourage free fluid oral intake  Chronic diastolic CHF Hypervolemic on exam Last 2D echo done on 12/17/2019 showed LVEF 65-70% With grade 1 diastolic dysfunction Hold off lasix due to recent dehydration and AKI  GERD Continue p.o. PPI Follow up with GI  BPH Continue Flomax Follow up with urology  Ambulatory dysfunction with frequent falls 3 falls at home within the last 2 weeks per his niece [name] in the bathroom hospital 3/29, denies hitting his head Denies feeling dizzy prior to falling Lives alone, has not been able to appropriately take care of himself per his niece. Continue PT OT with assistance and fall precautions. TOC assisting with placement  Avascular necrosis of the left femoral neck without acute fracture or cortical collapse from CT abd/pelvis wo contrast 01/02/20. Fall precautions Follow up with your PCP     Code Status: Full   Consults called: Psych.     Discharge Exam: BP 136/82 (BP Location: Right Arm)   Pulse 74   Temp 97.9 F (36.6 C) (Oral)   Resp 20   Ht 6\' 1"  (1.854 m)   Wt 63.5 kg   SpO2 100%   BMI 18.47 kg/m  . General: 84 y.o. year-old male well developed well nourished in no acute distress.  Alert and oriented x3. . Cardiovascular: Regular rate and rhythm with no rubs or gallops.  No thyromegaly or JVD noted.   Marland Kitchen Respiratory: Clear to auscultation with no wheezes or rales. Good inspiratory effort. . Abdomen: Soft nontender nondistended with normal bowel sounds x4 quadrants. . Musculoskeletal: No lower extremity edema. 2/4 pulses in all 4 extremities. Marland Kitchen Psychiatry: Mood is appropriate for condition and setting  Discharge Instructions You were cared for by a hospitalist during your hospital stay. If you have any questions about your discharge medications or the care you received while you were  in the hospital after you are discharged, you can call the unit and asked to speak with the hospitalist on call if the hospitalist that took care of you is not available. Once you are discharged, your primary care physician will handle any further medical issues. Please note that NO REFILLS for any discharge medications will be authorized once you are discharged, as it is imperative that you return to your primary care physician (or establish a relationship with a primary care physician if you do not have one) for your aftercare needs so that they can reassess your need for medications and monitor your lab values.   Allergies as of 02/28/2020   No Known Allergies     Medication List    STOP taking these medications   amitriptyline 25 MG tablet Commonly known as: ELAVIL   furosemide 40 MG tablet Commonly known as: LASIX     TAKE these medications   aspirin 81 MG tablet Take 81 mg by mouth daily.   CENTRUM SILVER PO Take 1 tablet by mouth daily.   feeding supplement (ENSURE ENLIVE) Liqd Take 237 mLs by mouth 2 (two) times daily between meals for 10 days.   omeprazole 20 MG capsule Commonly known as: PRILOSEC Take 1 capsule by mouth once daily   polyethylene glycol 17 g packet Commonly known as: MIRALAX /  GLYCOLAX Take 17 g by mouth daily.   senna 8.6 MG Tabs tablet Commonly known as: SENOKOT Take 1 tablet (8.6 mg total) by mouth daily. What changed:   when to take this  reasons to take this   sertraline 25 MG tablet Commonly known as: ZOLOFT Take 1 tablet (25 mg total) by mouth daily.   simvastatin 20 MG tablet Commonly known as: ZOCOR TAKE 1 TABLET BY MOUTH AT BEDTIME   tamsulosin 0.4 MG Caps capsule Commonly known as: FLOMAX Take 0.4 mg by mouth daily after supper.            Durable Medical Equipment  (From admission, onward)         Start     Ordered   02/24/20 1522  For home use only DME Tub bench  Once     02/24/20 1521   02/23/20 1709  For home  use only DME Walker rolling  Once    Question Answer Comment  Walker: With 5 Inch Wheels   Patient needs a walker to treat with the following condition Ambulatory dysfunction      02/23/20 1709         No Known Allergies  Contact information for follow-up providers    Binnie Rail, MD. Call in 1 day(s).   Specialty: Internal Medicine Why: Please call for a post hospital follow-up appointment. Contact information: [place] [address] [phone]        Franchot Gallo, MD. Call in 1 day(s).   Specialty: Urology Why: Please call for a post hospital follow-up appointment. Contact information: [place] [address] [phone]        Richmond Campbell, MD. Call in 1 day(s).   Specialty: Gastroenterology Why: Please call for a post hospital follow-up appointment. Contact information: [place] [address] [phone]        Hampton Abbot, MD. Call in 1 day(s).   Specialty: Psychiatry Why: Please call for a post hospital follow-up appointment. Contact information: [place] [address] [phone]            Contact information for after-discharge care    Destination    [facility] .   Service: Skilled Nursing Contact information: [place] [phone]                   The results of significant diagnostics from this hospitalization (including imaging, microbiology, ancillary and laboratory) are listed below for reference.    Significant Diagnostic Studies: DG Chest 2 View  Result Date: 02/13/2020 CLINICAL DATA:  Weight loss. Occasional dyspnea. Former smoker. EXAM: CHEST - 2 VIEW COMPARISON:  December 16, 2019 FINDINGS: The cardiomediastinal silhouette is normal. The lungs are hyperinflated. There are barrel chest deformity and bilateral hilar architectural distortion. There is no pneumonia, pulmonary  edema, pleural effusion or pneumothorax. Moderate skeletal degenerative change and aortic knob calcified atherosclerosis are present. IMPRESSION: Moderate hyperinflation without evidence of acute cardiopulmonary disease. Thoracic aortic calcified atherosclerosis. Electronically Signed   By: Revonda Humphrey   On: 02/13/2020 23:22   MR ABDOMEN WO CONTRAST  Result Date: 02/25/2020 CLINICAL DATA:  Pancreatic lesion on CT EXAM: MRI ABDOMEN WITHOUT CONTRAST TECHNIQUE: Multiplanar multisequence MR imaging was performed without the administration of intravenous contrast. COMPARISON:  CT abdomen/pelvis dated 01/02/2020 FINDINGS: Limited evaluation due to motion degradation and lack of intravenous contrast administration. Lower chest: Lung bases are clear. Hepatobiliary:  Unenhanced liver is unremarkable. No morphologic findings of cirrhosis. No hepatic steatosis. Gallbladder is unremarkable. No intrahepatic or extrahepatic ductal dilatation. Pancreas: 8 mm unilocular cyst along the proximal pancreatic tail (series 3/image 8), likely benign. Additional 14 mm unilocular cyst at the junction of the pancreatic tail and anterior left kidney (series 5/image 23). While the origin is indeterminate on the current study, this favors a pancreatic cyst on prior CT. No pancreatic atrophy or ductal dilatation. Spleen:  Within normal limits. Adrenals/Urinary Tract:  Adrenal glands are within normal limits. Multiple bilateral renal cysts, measuring up to 3.8 cm in the anterior left lower kidney (series 5/image 29), simple/benign on MRI. No hydronephrosis. Stomach/Bowel: Stomach is within normal limits. Visualized bowel is unremarkable, noting sigmoid diverticulosis. Vascular/Lymphatic:  No evidence of abdominal aortic aneurysm. No suspicious abdominal lymphadenopathy. Other:  No abdominal ascites. Musculoskeletal: No focal osseous lesions. IMPRESSION: Limited evaluation due to motion degradation and lack of intravenous contrast  administration. Two unilocular pancreatic tail cysts measuring up to 14 mm, likely benign. Given patient's age, it is unclear that follow-up is warranted. However, if desired, initial follow-up CT or MRI abdomen with/without contrast is suggested in 2 years. This recommendation follows ACR consensus guidelines: Management of Incidental Pancreatic Cysts: A [place] Paper of the ACR Incidental Findings Committee. [place] 9024;[id number]. Electronically Signed   By: Julian Hy M.D.   On: 02/25/2020 20:50   DG ABD ACUTE 2+V W 1V CHEST  Result Date: 02/22/2020 CLINICAL DATA:  Nausea with eating. EXAM: DG ABDOMEN ACUTE W/ 1V CHEST COMPARISON:  CT abdomen pelvis dated 01/02/2020. FINDINGS: There is no evidence of dilated bowel loops or free intraperitoneal air. No radiopaque calculi or other significant radiographic abnormality is seen. Heart size and mediastinal contours are within normal limits. The lungs are hyperinflated. Both lungs are clear. IMPRESSION: Negative abdominal radiographs. No acute cardiopulmonary disease. Hyperinflated lungs likely reflect chronic obstructive pulmonary disease. Electronically Signed   By: Zerita Boers M.D.   On: 02/22/2020 17:34   US Abdomen Limited RUQ  Result Date: 02/22/2020 CLINICAL DATA:  Elevated LFTs, nausea EXAM: ULTRASOUND ABDOMEN LIMITED RIGHT UPPER QUADRANT COMPARISON:  01/02/2020, 05/02/2007 FINDINGS: Gallbladder: No gallstones or wall thickening visualized. No sonographic Murphy sign noted by sonographer. Common bile duct: Diameter: 3 mm Liver: No focal lesion identified. Within normal limits in parenchymal echogenicity. Portal vein is patent on color Doppler imaging with normal direction of blood flow towards the liver. Other: None. IMPRESSION: 1. Unremarkable right upper quadrant ultrasound. Electronically Signed   By: Randa Ngo M.D.   On: 02/22/2020 17:56    Microbiology: Recent Results (from the past 240 hour(s))  SARS CORONAVIRUS 2 (TAT  6-24 HRS) Nasopharyngeal Nasopharyngeal Swab     Status: None   Collection Time: 02/22/20  6:00 PM   Specimen: Nasopharyngeal Swab  Result Value Ref Range Status   SARS Coronavirus 2 NEGATIVE NEGATIVE Final    Comment: (NOTE) SARS-CoV-2 target nucleic acids are NOT DETECTED. The SARS-CoV-2 RNA is generally detectable in upper and lower respiratory specimens during the acute phase of infection. Negative results do not preclude SARS-CoV-2 infection, do not rule out co-infections with other pathogens, and should not be used as the sole basis for treatment or other patient management decisions. Negative results must be combined with clinical observations, patient history, and epidemiological information. The expected result is Negative. Fact Sheet for Patients: SugarRoll.be Fact Sheet for Healthcare Providers: https://www.woods-mathews.com/ This test is not yet approved or cleared by the [place]  FDA and  has been authorized for detection and/or diagnosis of SARS-CoV-2 by FDA under an Emergency Use Authorization (EUA). This EUA will remain  in effect (meaning this test can be used) for the duration of the COVID-19 declaration under Section 56 4(b)(1) of the Act, 21 U.S.C. section 360bbb-3(b)(1), unless the authorization is terminated or revoked sooner. Performed at [facility], [place] [address], [place], [place] [address]   SARS CORONAVIRUS 2 (TAT 6-24 HRS) Nasopharyngeal Nasopharyngeal Swab     Status: None   Collection Time: 02/27/20  6:32 PM   Specimen: Nasopharyngeal Swab  Result Value Ref Range Status   SARS Coronavirus 2 NEGATIVE NEGATIVE Final    Comment: (NOTE) SARS-CoV-2 target nucleic acids are NOT DETECTED. The SARS-CoV-2 RNA is generally detectable in upper and lower respiratory specimens during the acute phase of infection. Negative results do not preclude SARS-CoV-2 infection, do not rule out co-infections with other  pathogens, and should not be used as the sole basis for treatment or other patient management decisions. Negative results must be combined with clinical observations, patient history, and epidemiological information. The expected result is Negative. Fact Sheet for Patients: SugarRoll.be Fact Sheet for Healthcare Providers: https://www.woods-mathews.com/ This test is not yet approved or cleared by the [place] FDA and  has been authorized for detection and/or diagnosis of SARS-CoV-2 by FDA under an Emergency Use Authorization (EUA). This EUA will remain  in effect (meaning this test can be used) for the duration of the COVID-19 declaration under Section 56 4(b)(1) of the Act, 21 U.S.C. section 360bbb-3(b)(1), unless the authorization is terminated or revoked sooner. Performed at [facility], [place] [address] [place], [place] [address]      Labs: Basic Metabolic Panel: Recent Labs  Lab 02/23/20 1709 02/24/20 0548 02/25/20 0533 02/26/20 0457 02/27/20 0502  NA 153* 151* 148* 148* 149*  K 4.1 4.1 4.3 4.3 4.3  CL 120* 119* 118* 118* 118*  CO2 24 24 24 25 24   GLUCOSE 153* 100* 109* 106* 101*  BUN 90* 86* 87* 78* 78*  CREATININE 3.23* 3.39* 3.12* 2.92* 2.91*  CALCIUM 8.3* 8.3* 8.1* 8.2* 8.3*   Liver Function Tests: Recent Labs  Lab 02/22/20 1540 02/24/20 0548  AST 175* 61*  ALT 253* 147*  ALKPHOS 161* 135*  BILITOT 0.7 0.7  PROT 4.9* 4.3*  ALBUMIN 2.3* 2.1*   Recent Labs  Lab 02/22/20 1540  LIPASE 20   No results for input(s): AMMONIA in the last 168 hours. CBC: Recent Labs  Lab 02/22/20 1540 02/23/20 0521  WBC 5.0 5.5  HGB 12.1* 11.1*  HCT 37.4* 34.2*  MCV 102.2* 103.3*  PLT 150 151   Cardiac Enzymes: No results for input(s): CKTOTAL, CKMB, CKMBINDEX, TROPONINI in the last 168 hours. BNP: BNP (last 3 results) No results for input(s): BNP in the last 8760 hours.  ProBNP (last 3 results) Recent Labs      12/29/19 1639 02/13/20 1431  PROBNP 224.0* 84.0    CBG: No results for input(s): GLUCAP in the last 168 hours.     Signed:  Kayleen Memos, MD [facility] 02/28/2020, 9:53 AM

## 2020-02-28 NOTE — Progress Notes (Signed)
Nutrition Follow-up  DOCUMENTATION CODES:   Severe malnutrition in context of acute illness/injury, Underweight  INTERVENTION:  Boost Breeze po TID, each supplement provides 250 kcal and 9 grams of protein  Diet advances per GI recommendations  NUTRITION DIAGNOSIS:   Severe Malnutrition related to acute illness(unknown etiology) as evidenced by moderate fat depletion, moderate muscle depletion, severe muscle depletion, percent weight loss. Ongoing, addressing via nutrition supplements    GOAL:   Patient will meet greater than or equal to 90% of their needs Progessing    MONITOR:   PO intake, Supplement acceptance, Labs, Weight trends  REASON FOR ASSESSMENT:   Consult Assessment of nutrition requirement/status  ASSESSMENT:  RD working remotely.  84 y.o. male with medical history of BPH, hyperlipidemia, and GERD who presented to the ED with concerns of decreased p.o. intake and weakness. He is not a good historian overall. He is able to tolerate some fruit and cereal but has had a poor appetite and has not had a balanced meal for several weeks. He reported nausea and occasional vomiting that may be worse with food intake. He sometimes notices abdominal pain. He thinks he can hear his "stomach sloshing around" when he gets up to use the restroom. He reported losing 25 lb in the couple of weeks PTA. He also reported issues with balance and feeling like he is going to fall over when he stands up. Family history includes a brother with lung cancer but no other abdominal related cancer of which he is aware.  Patient discharge delayed due to nausea and episode of vomiting late this morning and elevated LFTs.   Per notes: -GI consulted -CL diet per GI -repeat liver tests and lipase in am -consider endoscopy on Monday pending clinical course  Medications reviewed: IVF D5 NaCl @ 100 ml/hr  Labs: Alkaline Phos 145 (H), AST 56 (H), ALT 116 (H)  Diet Order:   Diet Order             Diet clear liquid Room service appropriate? Yes; Fluid consistency: Thin  Diet effective now              EDUCATION NEEDS:   No education needs have been identified at this time  Skin:  Skin Assessment: Reviewed RN Assessment  Last BM:  PTA/unknown  Height:   Ht Readings from Last 1 Encounters:  02/22/20 6\' 1"  (1.854 m)    Weight:   Wt Readings from Last 1 Encounters:  02/26/20 63.5 kg    Ideal Body Weight:  83.6 kg  BMI:  Body mass index is 18.47 kg/m.  Estimated Nutritional Needs:   Kcal:  1870-2055 kcal  Protein:  80-95 grams  Fluid:  >/= 2 L/day   Lajuan Lines, RD, LDN Clinical Nutrition After Hours/Weekend Pager # in [place]

## 2020-02-28 NOTE — Consult Note (Addendum)
Reason for Consult: Nausea vomiting weight loss Referring Physician: Hospital team  Alvin Ingram is an 84 y.o. male.  HPI: Patient seen and examined in hospital computer chart reviewed and case discussed with the hospital team unfortunately it is hard to get an accurate history from the patient and he was deemed ready for discharge earlier today until he had some increased nausea and vomiting and we are consulted for further work-up and plans and consideration of an endoscopy and he is not having any abdominal pain and denies any bowel movement complaint and supposedly he lives alone but I do have trouble understanding some of his answers and he does have some history of depression and his renal failure is worse than his baseline but no other complaints  Past Medical History:  Diagnosis Date  . Cardiac murmur    Aortic  . Diverticulosis 2006  . GERD (gastroesophageal reflux disease)   . History of colonic polyps 2003,2011   Dr Earlean Shawl  . History of syncope 1966   ? stress related  . Hyperlipidemia   . Hypertension    hypertensive response @ stress test    Past Surgical History:  Procedure Laterality Date  . CATARACT EXTRACTION  2003 & 2011   Dr Charise Killian  . COLONOSCOPY  04/2005   tics, no polyps. Dr.Medoff  . COLONOSCOPY W/ POLYPECTOMY  2003, 2011   neg 2006; due 2016. Dr Earlean Shawl    Family History  Problem Relation Age of Onset  . Heart attack Mother 35  . Colon polyps Brother   . Pneumonia Brother   . Heart attack Brother 56  . Lung cancer Brother   . Other Father        Benign Brain Tumor  . Hypertension Brother   . Tuberculosis Paternal Uncle   . Other Maternal Grandmother   . Diabetes Neg Hx   . Stroke Neg Hx     Social History:  reports that he quit smoking about 52 years ago. He has never used smokeless tobacco. He reports current alcohol use. He reports that he does not use drugs.  Allergies: No Known Allergies  Medications: I have reviewed the patient's current  medications.  Results for orders placed or performed during the hospital encounter of 02/22/20 (from the past 48 hour(s))  Basic metabolic panel     Status: Abnormal   Collection Time: 02/27/20  5:02 AM  Result Value Ref Range   Sodium 149 (H) 135 - 145 mmol/L   Potassium 4.3 3.5 - 5.1 mmol/L   Chloride 118 (H) 98 - 111 mmol/L   CO2 24 22 - 32 mmol/L   Glucose, Bld 101 (H) 70 - 99 mg/dL    Comment: Glucose reference range applies only to samples taken after fasting for at least 8 hours.   BUN 78 (H) 8 - 23 mg/dL   Creatinine, Ser 2.91 (H) 0.61 - 1.24 mg/dL   Calcium 8.3 (L) 8.9 - 10.3 mg/dL   GFR calc non Af Amer 19 (L) >60 mL/min   GFR calc Af Amer 22 (L) >60 mL/min   Anion gap 7 5 - 15    Comment: Performed at [facility], [place] [address], [place], [place] [address]  SARS CORONAVIRUS 2 (TAT 6-24 HRS) Nasopharyngeal Nasopharyngeal Swab     Status: None   Collection Time: 02/27/20  6:32 PM   Specimen: Nasopharyngeal Swab  Result Value Ref Range   SARS Coronavirus 2 NEGATIVE NEGATIVE    Comment: (NOTE) SARS-CoV-2 target  nucleic acids are NOT DETECTED. The SARS-CoV-2 RNA is generally detectable in upper and lower respiratory specimens during the acute phase of infection. Negative results do not preclude SARS-CoV-2 infection, do not rule out co-infections with other pathogens, and should not be used as the sole basis for treatment or other patient management decisions. Negative results must be combined with clinical observations, patient history, and epidemiological information. The expected result is Negative. Fact Sheet for Patients: SugarRoll.be Fact Sheet for Healthcare Providers: https://www.woods-mathews.com/ This test is not yet approved or cleared by the [place] FDA and  has been authorized for detection and/or diagnosis of SARS-CoV-2 by FDA under an Emergency Use Authorization (EUA). This EUA will remain   in effect (meaning this test can be used) for the duration of the COVID-19 declaration under Section 56 4(b)(1) of the Act, 21 U.S.C. section 360bbb-3(b)(1), unless the authorization is terminated or revoked sooner. Performed at [facility], [place] [address], [place], [place] [address]     No results found.  Review of Systems negative except above Blood pressure 136/82, pulse 74, temperature 97.9 F (36.6 C), temperature source Oral, resp. rate 20, height 6\' 1"  (1.854 m), weight 63.5 kg, SpO2 100 %. Physical Exam Vital signs stable afebrile no acute distress currently he denies nausea vomiting at this time lungs are clear decreased heart sounds abdomen is soft rare bowel sounds nontender labs reviewed minimally elevated liver tests questionable significance CT ultrasound MRI reviewed Assessment/Plan: Multiple medical problems including weight loss and now with some acute nausea and vomiting Plan: We will change his diet to clear liquids will add repeat liver tests and lipase to a.m. labs and will check on tomorrow and consider endoscopy Monday or other work-up and plans pending clinical course and if nausea vomiting continues consider flat and upright abdomen x-ray versus oral contrast only CT although based on how he looks now he does not need an urgent x-ray at this time  [facility] E 02/28/2020, 12:15 PM

## 2020-02-28 NOTE — TOC Progression Note (Signed)
Transition of Care [facility]) - Progression Note    Patient Details  Name: Alvin Ingram MRN: [id number] Date of Birth: [birth year]  [facility]) CM/SW Contact  Ross Ludwig, [place] Phone Number: 02/28/2020, 1:38 PM  Clinical Narrative:     CSW was informed by admissions worker Tammy at [facility] that patient has been approved by insurance company for SNF placement.  Patient can discharge once he is medically ready for discharge.   Expected Discharge Plan: [place] Barriers to Discharge: Continued Medical Work up  Expected Discharge Plan and Services Expected Discharge Plan: [place]   Discharge Planning Services: CM Consult   Living arrangements for the past 2 months: Single Family Home Expected Discharge Date: 02/28/20                                     Social Determinants of Health (SDOH) Interventions    Readmission Risk Interventions No flowsheet data found.

## 2020-02-28 NOTE — Progress Notes (Addendum)
Discharge delayed due to nausea and 1 episode of vomiting late this morning.  Will get LFTs and further assess.  [facility], Dr. Watt Climes will see in consultation.  Greatly appreciate assistance.

## 2020-02-29 ENCOUNTER — Encounter (HOSPITAL_COMMUNITY): Payer: Self-pay | Admitting: Internal Medicine

## 2020-02-29 ENCOUNTER — Inpatient Hospital Stay (HOSPITAL_COMMUNITY): Payer: Medicare HMO

## 2020-02-29 LAB — COMPREHENSIVE METABOLIC PANEL
ALT: 84 U/L — ABNORMAL HIGH (ref 0–44)
AST: 31 U/L (ref 15–41)
Albumin: 1.8 g/dL — ABNORMAL LOW (ref 3.5–5.0)
Alkaline Phosphatase: 133 U/L — ABNORMAL HIGH (ref 38–126)
Anion gap: 7 (ref 5–15)
BUN: 76 mg/dL — ABNORMAL HIGH (ref 8–23)
CO2: 23 mmol/L (ref 22–32)
Calcium: 8.1 mg/dL — ABNORMAL LOW (ref 8.9–10.3)
Chloride: 117 mmol/L — ABNORMAL HIGH (ref 98–111)
Creatinine, Ser: 3.24 mg/dL — ABNORMAL HIGH (ref 0.61–1.24)
GFR calc Af Amer: 20 mL/min — ABNORMAL LOW (ref >60)
GFR calc non Af Amer: 17 mL/min — ABNORMAL LOW (ref >60)
Glucose, Bld: 121 mg/dL — ABNORMAL HIGH (ref 70–99)
Potassium: 4.2 mmol/L (ref 3.5–5.1)
Sodium: 147 mmol/L — ABNORMAL HIGH (ref 135–145)
Total Bilirubin: 0.5 mg/dL (ref 0.3–1.2)
Total Protein: 4 g/dL — ABNORMAL LOW (ref 6.5–8.1)

## 2020-02-29 LAB — CBC WITH DIFFERENTIAL/PLATELET
Abs Immature Granulocytes: 0.02 10*3/uL (ref 0.00–0.07)
Basophils Absolute: 0 10*3/uL (ref 0.0–0.1)
Basophils Relative: 0 %
Eosinophils Absolute: 0 10*3/uL (ref 0.0–0.5)
Eosinophils Relative: 0 %
HCT: 33 % — ABNORMAL LOW (ref 39.0–52.0)
Hemoglobin: 11 g/dL — ABNORMAL LOW (ref 13.0–17.0)
Immature Granulocytes: 0 %
Lymphocytes Relative: 22 %
Lymphs Abs: 1.3 10*3/uL (ref 0.7–4.0)
MCH: 32.9 pg (ref 26.0–34.0)
MCHC: 33.3 g/dL (ref 30.0–36.0)
MCV: 98.8 fL (ref 80.0–100.0)
Monocytes Absolute: 0.5 10*3/uL (ref 0.1–1.0)
Monocytes Relative: 8 %
Neutro Abs: 4 10*3/uL (ref 1.7–7.7)
Neutrophils Relative %: 70 %
Platelets: 131 10*3/uL — ABNORMAL LOW (ref 150–400)
RBC: 3.34 MIL/uL — ABNORMAL LOW (ref 4.22–5.81)
RDW: 13.8 % (ref 11.5–15.5)
WBC: 5.9 10*3/uL (ref 4.0–10.5)
nRBC: 0 % (ref 0.0–0.2)

## 2020-02-29 MED ORDER — DEXTROSE 5 % IV SOLN: INTRAVENOUS | Status: DC

## 2020-02-29 NOTE — Progress Notes (Signed)
Alvin Ingram 12:03 PM  Subjective: Patient still does not like my voice but is more talkative today and we discussed his previous colonoscopy by a different gastroenterologist and he does have some right-sided middle abdominal pain and he is continuing to spit up a little but no vomiting and is tolerating clear liquids and has no other complaints  Objective: Vital signs stable afebrile no acute distress abdomen is soft there is minimal right-sided middle discomfort normal bowel sounds no guarding or rebound labs stable still concerned about increased BUN and creatinine  Assessment: Weight loss nausea abdominal pain  Plan: We discussed endoscopy and compared it to the colonoscopy he has had and my partner Dr. Shirley Muscat will proceed tomorrow morning with further work-up and plans pending those findings  [facility]  office [phone] After 5PM or if no answer call [phone]

## 2020-02-29 NOTE — Progress Notes (Addendum)
PROGRESS NOTE  Alvin Ingram GGY:[id number] DOB: Sep 12, 1936 DOA: 02/22/2020 PCP: Binnie Rail, MD  HPI/Recap of past 24 hours: Alvin Ingram a 84 y.o.malewith medical history significant forBPH followed by urology, hyperlipidemia and GERD who presents to the ED as advised by his niece with concerns for decreased p.o. intake, suspected uncontrolled depression, weight loss, and generalized weakness.  Evaluated by his PCP about 2 weeks prior.  Labs showing mildly elevated LFTs.   On Elavil for depression which can cause elevation in LFTs.  A CT of his abdomen in February (01/02/20) showed extensive sigmoid diverticulosis and a 15 mm hypodense lesion of the distal body of the pancreas that was indeterminate.  MRI abdomen (02/25/20) showed a cystic lesion of the pancreas, with radiology recommendation for surveillance in 2 years.   Spoke with his niece via phone.  She is concerned that he might be depressed, recently lost a friend.  On Elavil for depression.  Admitted to feeling depressed without suicidal or homicidal ideation.  Seen by psych with recommendation to DC Elavil and start Zoloft.  Appetite is slowly improving.  Evaluated by PT OT with recommendation for SNF with 24-hour assistance/supervision.  Patient is receptive to going to SNF.  02/29/20: Persistent nausea this AM. R mid quadrant abd pain with palpation.  Appreciate GI assistance.  Possible endoscopy tomorrow.  NPO after midnight.  Worsening renal function.  Ordered renal US and consulted nephrology.    Assessment/Plan: Principal Problem:   Failure to thrive in adult Active Problems:   AKI (acute kidney injury) ([place])   Transaminitis   Weakness   Hypernatremia   Protein-calorie malnutrition, severe  Failure to thrive in adult/ Severe weight loss/ Depression Presented with poor oral intake, poor appetite, weight loss, uncontrolled depression Improving with change in psych medication Elavil DCed and started on Zoloft  02/25/20. He had excessive sedation on Elavil and elevation in his LFTs Continue to Encourage increase in oral protein calorie intake Follow up with psych outpatient  AKI on CKD 3B, suspect prerenal in the setting of poor oral intake and dehydration Presented with creatinine 3.24 Baseline creatinine 2 with GFR 38 Creatinine uptrending to 2.9>>3.24; BUN 76 Renal US evidence of medical renal disease with multiple renal cysts bilaterally .  No evidence of obstruction. Continue to avoid nephrotoxins and dehydration Restarted IV fluid Consulted nephrology.  Appreciate nephrology's assistance  Intractable nausea with transient vomiting Continue antiemetics PRN Appreciate GI's assistance Possible endoscopy tomorrow NPO after midnight  Acute transaminitis in the setting of Elavil, improving Possibly iatrogenic, Elavil can increase LFTs Elevated alkaline phosphatase, AST and ALT, trending down T bili normal Right upper quadrant abdominal ultrasound 02/22/20 negative. No gallstones or wall thickening visualized. No sonographic Murphy sign noted by sonographer. Acute hepatitis panel negative Continue to avoid hepatotoxic agents  15 mm low attenuating lesion with fluid attenuation along the posterior distal body of the pancreas/ B/L renal cysts MRI shows cystic lesion of the pancreas, recommend surveillance withrepeat imagings in 2 years.  Resolved Hypovolemic hypernatremia with iV fluid Presented with serum sodium 153> 151>148> 147 Avoid dehydration Encourage free fluid oral intake  Chronic diastolic CHF Hypervolemic on exam Last 2D echo done on 12/17/2019 showed LVEF 65-70% With grade 1 diastolic dysfunction Hold off lasix due to recent dehydration and AKI Monitor volume status on IV fluid  R pleural effusion on renal US Incidental finding Monitor volume status on IV fluid Start incentive spirometer O2 sat 98% RA Maintain O2 sat>92%  GERD Continue p.o. PPI  Follow up  with GI  BPH Continue Flomax Follow up with urology  Ambulatory dysfunction with frequent falls 3 falls at home within the last 2 weeks per his niece Alvin Ingram in the bathroom hospital 3/29, denies hitting his head Denies feeling dizzy prior to falling Lives alone, has not been able to appropriately take care of himself per his niece. Continue PT OT with assistance and fall precautions. TOC assisting with placement  Avascular necrosis of the left femoral neck without acute fracture or cortical collapse from CT abd/pelvis wo contrast 01/02/20. Fall precautions Follow up with your PCP     Code Status: Full   Consults called:Psych, GI, nephro   Disposition Plan: Patient is from home.  Anticipate discharge to SNF when symptomatology has improved, GI and nephrology have signed off.    Objective: Vitals:   02/28/20 0619 02/28/20 1338 02/28/20 2100 02/29/20 0611  BP: 136/82 (!) 160/96 135/83 136/81  Pulse: 74 85 79 80  Resp: 20 16 17 18   Temp: 97.9 F (36.6 C) 97.7 F (36.5 C) 98 F (36.7 C) (!) 97.5 F (36.4 C)  TempSrc: Oral Oral Oral Oral  SpO2: 100% 100% 100% 98%  Weight:    67.7 kg  Height:        Intake/Output Summary (Last 24 hours) at 02/29/2020 1209 Last data filed at 02/29/2020 5400 Gross per 24 hour  Intake 1545.89 ml  Output 1000 ml  Net 545.89 ml   Filed Weights   02/22/20 2122 02/26/20 0500 02/29/20 0611  Weight: 62.3 kg 63.5 kg 67.7 kg    Exam: No changes from prior exam.  . General: 84 y.o. year-old male Frail appearing incomfortable due to nausea.  A&O x 3. . Cardiovascular: RRR no rubs or gallops  . Respiratory: Mild rales at bases.  No wheezes. . Abdomen: Soft R mid Q tenderness with palpation. Bowel sounds present..   . Musculoskeletal: No LE edema . Psychiatry: Mood is appropriate.  Data Reviewed: CBC: Recent Labs  Lab 02/22/20 1540 02/23/20 0521 02/29/20 0519  WBC 5.0 5.5 5.9  NEUTROABS  --   --  4.0  HGB 12.1* 11.1* 11.0*   HCT 37.4* 34.2* 33.0*  MCV 102.2* 103.3* 98.8  PLT 150 151 867*   Basic Metabolic Panel: Recent Labs  Lab 02/24/20 0548 02/25/20 0533 02/26/20 0457 02/27/20 0502 02/29/20 0519  NA 151* 148* 148* 149* 147*  K 4.1 4.3 4.3 4.3 4.2  CL 119* 118* 118* 118* 117*  CO2 24 24 25 24 23   GLUCOSE 100* 109* 106* 101* 121*  BUN 86* 87* 78* 78* 76*  CREATININE 3.39* 3.12* 2.92* 2.91* 3.24*  CALCIUM 8.3* 8.1* 8.2* 8.3* 8.1*   GFR: Estimated Creatinine Clearance: 17.1 mL/min (A) (by C-G formula based on SCr of 3.24 mg/dL (H)). Liver Function Tests: Recent Labs  Lab 02/22/20 1540 02/24/20 0548 02/28/20 1153 02/29/20 0519  AST 175* 61* 56* 31  ALT 253* 147* 116* 84*  ALKPHOS 161* 135* 145* 133*  BILITOT 0.7 0.7 0.7 0.5  PROT 4.9* 4.3* 4.3* 4.0*  ALBUMIN 2.3* 2.1* 2.0* 1.8*   Recent Labs  Lab 02/22/20 1540 02/28/20 1156  LIPASE 20 19   No results for input(s): AMMONIA in the last 168 hours. Coagulation Profile: Recent Labs  Lab 02/22/20 2128  INR 0.9   Cardiac Enzymes: No results for input(s): CKTOTAL, CKMB, CKMBINDEX, TROPONINI in the last 168 hours. BNP (last 3 results) Recent Labs    12/29/19 1639 02/13/20 1431  PROBNP 224.0* 84.0  HbA1C: No results for input(s): HGBA1C in the last 72 hours. CBG: No results for input(s): GLUCAP in the last 168 hours. Lipid Profile: No results for input(s): CHOL, HDL, LDLCALC, TRIG, CHOLHDL, LDLDIRECT in the last 72 hours. Thyroid Function Tests: No results for input(s): TSH, T4TOTAL, FREET4, T3FREE, THYROIDAB in the last 72 hours. Anemia Panel: No results for input(s): VITAMINB12, FOLATE, FERRITIN, TIBC, IRON, RETICCTPCT in the last 72 hours. Urine analysis:    Component Value Date/Time   COLORURINE YELLOW 02/22/2020 1540   APPEARANCEUR CLEAR 02/22/2020 1540   LABSPEC 1.014 02/22/2020 1540   PHURINE 6.0 02/22/2020 1540   GLUCOSEU NEGATIVE 02/22/2020 1540   GLUCOSEU NEGATIVE 01/02/2020 1028   HGBUR NEGATIVE 02/22/2020  1540   BILIRUBINUR NEGATIVE 02/22/2020 1540   KETONESUR NEGATIVE 02/22/2020 1540   PROTEINUR >=300 (A) 02/22/2020 1540   UROBILINOGEN 0.2 01/02/2020 1028   NITRITE NEGATIVE 02/22/2020 1540   LEUKOCYTESUR NEGATIVE 02/22/2020 1540   Sepsis Labs: @LABRCNTIP (procalcitonin:4,lacticidven:4)  ) Recent Results (from the past 240 hour(s))  SARS CORONAVIRUS 2 (TAT 6-24 HRS) Nasopharyngeal Nasopharyngeal Swab     Status: None   Collection Time: 02/22/20  6:00 PM   Specimen: Nasopharyngeal Swab  Result Value Ref Range Status   SARS Coronavirus 2 NEGATIVE NEGATIVE Final    Comment: (NOTE) SARS-CoV-2 target nucleic acids are NOT DETECTED. The SARS-CoV-2 RNA is generally detectable in upper and lower respiratory specimens during the acute phase of infection. Negative results do not preclude SARS-CoV-2 infection, do not rule out co-infections with other pathogens, and should not be used as the sole basis for treatment or other patient management decisions. Negative results must be combined with clinical observations, patient history, and epidemiological information. The expected result is Negative. Fact Sheet for Patients: SugarRoll.be Fact Sheet for Healthcare Providers: https://www.woods-mathews.com/ This test is not yet approved or cleared by the [place] FDA and  has been authorized for detection and/or diagnosis of SARS-CoV-2 by FDA under an Emergency Use Authorization (EUA). This EUA will remain  in effect (meaning this test can be used) for the duration of the COVID-19 declaration under Section 56 4(b)(1) of the Act, 21 U.S.C. section 360bbb-3(b)(1), unless the authorization is terminated or revoked sooner. Performed at [facility], [place] [address] [place], [place] [address]   SARS CORONAVIRUS 2 (TAT 6-24 HRS) Nasopharyngeal Nasopharyngeal Swab     Status: None   Collection Time: 02/27/20  6:32 PM   Specimen: Nasopharyngeal Swab   Result Value Ref Range Status   SARS Coronavirus 2 NEGATIVE NEGATIVE Final    Comment: (NOTE) SARS-CoV-2 target nucleic acids are NOT DETECTED. The SARS-CoV-2 RNA is generally detectable in upper and lower respiratory specimens during the acute phase of infection. Negative results do not preclude SARS-CoV-2 infection, do not rule out co-infections with other pathogens, and should not be used as the sole basis for treatment or other patient management decisions. Negative results must be combined with clinical observations, patient history, and epidemiological information. The expected result is Negative. Fact Sheet for Patients: SugarRoll.be Fact Sheet for Healthcare Providers: https://www.woods-mathews.com/ This test is not yet approved or cleared by the [place] FDA and  has been authorized for detection and/or diagnosis of SARS-CoV-2 by FDA under an Emergency Use Authorization (EUA). This EUA will remain  in effect (meaning this test can be used) for the duration of the COVID-19 declaration under Section 56 4(b)(1) of the Act, 21 U.S.C. section 360bbb-3(b)(1), unless the authorization is terminated or revoked sooner. Performed at  [facility], [place] [address], [place], [place] [address]       Studies: US RENAL  Result Date: 02/29/2020 CLINICAL DATA:  Acute kidney injury EXAM: RENAL / URINARY TRACT ULTRASOUND COMPLETE COMPARISON:  Abdominal MRI February 25, 2020; renal ultrasound December 17, 2019 FINDINGS: Right Kidney: Renal measurements: 11.1 x 4.0 x 4.5 cm = volume: 104.6 mL . Echogenicity is increased. Renal cortical thickness within normal limits. No perinephric fluid or hydronephrosis visualized. There is a cyst in the upper right kidney measuring 3.3 x 3.1 x 1.8 cm. There is a cyst in the mid right kidney measuring 2.0 x 2.0 x 1.9 cm. Several smaller renal cysts are noted on the right. No sonographically demonstrable calculus or  ureterectasis. Left Kidney: Renal measurements: 11.1 x 6.5 x 6.6 cm = volume: 249.4 mL. Echogenicity is increased. Renal cortical thickness is normal. No perinephric fluid or hydronephrosis visualized. Multiple renal cysts noted on the left. Largest cyst arises in the lower pole region measuring 3.9 x 3.8 x 3.3 cm. A cyst in the upper pole region is mildly septated and measures 3.3 x 2.3 x 2.2 cm. A cyst in the mid left kidney measures 3.0 x 2.8 x 2.5 cm. Smaller cysts noted as well. No sonographically demonstrable calculus or ureterectasis. Bladder: Appears normal for degree of bladder distention. Other: Prostatic calcifications noted. Prostate measures 4.9 x 4.3 x 3.2 cm with a measured prostate volume of 34.6 cubic cm. Small right pleural effusion. IMPRESSION: 1. Echogenic kidneys bilaterally, a finding indicative of medical renal disease. Renal cortical thickness normal. No obstructing focus in either kidney. 2. Multiple renal cysts bilaterally. There is septation in a cyst on the left. Other cysts are simple cysts. 3.  Prostatic calculi.  Prostate mildly prominent in size. 4.  Small right pleural effusion. Electronically Signed   By: Lowella Grip III M.D.   On: 02/29/2020 08:52    Scheduled Meds: . aspirin EC  81 mg Oral Daily  . feeding supplement  1 Container Oral TID BM  . feeding supplement (ENSURE ENLIVE)  237 mL Oral BID BM  . heparin  5,000 Units Subcutaneous Q8H  . multivitamin with minerals  1 tablet Oral Daily  . pantoprazole  40 mg Oral Daily  . polyethylene glycol  17 g Oral BID  . senna-docusate  2 tablet Oral BID  . sertraline  25 mg Oral Daily  . tamsulosin  0.4 mg Oral QPC supper    Continuous Infusions: . sodium chloride 50 mL/hr at 02/28/20 0513  . dextrose 5 % and 0.45% NaCl 100 mL/hr at 02/29/20 9977     LOS: 5 days     Kayleen Memos, MD [facility] Pager [phone]  If 7PM-7AM, please contact night-coverage [email or web address] Password [facility] 02/29/2020,  12:09 PM

## 2020-02-29 NOTE — Consult Note (Signed)
Renal Service Consult Note [facility]  Kodah Maret 02/29/2020 Sol Blazing Requesting Physician:  Dr Nevada Crane, C.    Reason for Consult:  Renal failure  HPI: The patient is a 84 y.o. year-old w/ hx of HTN, HL and CKD 3 presented w/ nausea for several days, vomiting and poor appetite.  Having balance issues as well. In ED VSS, afeb, nl WBC, Hb 12, creat 3.15 (prior 2), some mild ^AST/ ALT, nl TSH.  RUQ [place] was neg. Pt was admitted for wt loss/ ^LFT"s and AoCKD3.  Pt was given IVF's. Renal fxn did not improve, creat today is 3.2.  MR abd showed normal appearing kidneys. Still having problems w/ nausea/ vomiting. Seen by GI yest and plans are for EGD tomorrow.  Asked to see for renal failure.   Pt is vague historian. "I have the same problems I came here for".   Denies voiding issues.  Has condom cath here.    Admit here in Jan 2021 for syncope, creat 3.0 improved to 1.8 w/ IVF"s.  ARB held and metoprolol held. Orthostasis improved w/ above measures. ECHO was unremarkable. Renal US showed no hydro.  Bladder scan showed retention of 700 cc requiring I/O cath.  Foley cath placed, to f/u w/ urology in OP setting. dc'd home.    ROS  denies CP  no joint pain   no HA  no blurry vision  no rash  no dysuria  no difficulty voiding  no change in urine color    Past Medical History  Past Medical History:  Diagnosis Date  . Cardiac murmur    Aortic  . Diverticulosis 2006  . GERD (gastroesophageal reflux disease)   . History of colonic polyps 2003,2011   Dr Earlean Shawl  . History of syncope 1966   ? stress related  . Hyperlipidemia   . Hypertension    hypertensive response @ stress test   Past Surgical History  Past Surgical History:  Procedure Laterality Date  . CATARACT EXTRACTION  2003 & 2011   Dr Charise Killian  . COLONOSCOPY  04/2005   tics, no polyps. Dr.Medoff  . COLONOSCOPY W/ POLYPECTOMY  2003, 2011   neg 2006; due 2016. Dr Earlean Shawl   Family History  Family History   Problem Relation Age of Onset  . Heart attack Mother 61  . Colon polyps Brother   . Pneumonia Brother   . Heart attack Brother 37  . Lung cancer Brother   . Other Father        Benign Brain Tumor  . Hypertension Brother   . Tuberculosis Paternal Uncle   . Other Maternal Grandmother   . Diabetes Neg Hx   . Stroke Neg Hx    Social History  reports that he quit smoking about 52 years ago. He has never used smokeless tobacco. He reports current alcohol use. He reports that he does not use drugs. Allergies No Known Allergies Home medications Prior to Admission medications   Medication Sig Start Date End Date Taking? Authorizing Provider  amitriptyline (ELAVIL) 25 MG tablet Take 25 mg by mouth at bedtime.   Yes [provider]  aspirin 81 MG tablet Take 81 mg by mouth daily.     Yes [provider]  Multiple Vitamins-Minerals (CENTRUM SILVER PO) Take 1 tablet by mouth daily.    Yes [provider]  omeprazole (PRILOSEC) 20 MG capsule Take 1 capsule by mouth once daily Patient taking differently: Take 20 mg by mouth daily.  08/25/19  Yes Burns, Claudina Lick, MD  polyethylene glycol (MIRALAX / GLYCOLAX) 17 g packet Take 17 g by mouth daily. 12/20/19  Yes Lucky Cowboy, MD  senna (SENOKOT) 8.6 MG TABS tablet Take 1 tablet (8.6 mg total) by mouth daily. Patient taking differently: Take 1 tablet by mouth daily as needed for mild constipation.  12/20/19  Yes Lucky Cowboy, MD  simvastatin (ZOCOR) 20 MG tablet TAKE 1 TABLET BY MOUTH AT BEDTIME Patient taking differently: Take 20 mg by mouth at bedtime.  10/27/19  Yes Burns, Claudina Lick, MD  tamsulosin (FLOMAX) 0.4 MG CAPS capsule Take 0.4 mg by mouth daily after supper.    Yes [provider]  feeding supplement, ENSURE ENLIVE, (ENSURE ENLIVE) LIQD Take 237 mLs by mouth 2 (two) times daily between meals for 10 days. 02/28/20 03/09/20  Kayleen Memos, DO  furosemide (LASIX) 40 MG tablet Take 1 tablet by mouth once  daily Patient not taking: Reported on 02/22/2020 01/21/20   Binnie Rail, MD  sertraline (ZOLOFT) 25 MG tablet Take 1 tablet (25 mg total) by mouth daily. 02/28/20 05/28/20  Kayleen Memos, DO     Vitals:   02/28/20 1338 02/28/20 2100 02/29/20 0611 02/29/20 1325  BP: (!) 160/96 135/83 136/81 139/89  Pulse: 85 79 80 74  Resp: _0 Temp: 97.7 F (36.5 C) 98 F (36.7 C) (!) 97.5 F (36.4 C) 97.8 F (36.6 C)  TempSrc: Oral Oral Oral Oral  SpO2: 100% 100% 98% 98%  Weight:   67.7 kg   Height:       Exam Gen elderly AAM, wdwn , some wt loss, no distress, tired No rash, cyanosis or gangrene Sclera anicteric, throat clear and moist  No jvd or bruits Chest clear bilat to bases no rales, wheezing, bronch BS RRR no MRG Abd soft ntnd no mass or ascites +bs GU normal male w/ condom cath  MS no joint effusions or deformity Ext 1-2+ pretib edema, no wounds or ulcers Neuro is alert, Ox 3 , nf   I/O 3L in and 3 L out   Home meds:  - furosemide 40 qd  - simvastatin 20 hs/ aspirin 81  - elavil 25 hs  - prilosec 40 qd/ flomax 0.4  - prn's/ vitamins/ supplements    UA 3/28 -  > 300 proteinuria, 0-5 rbc/ wbc        Date   Creat  Protein  eGFR    2009- 16  1.01- 1.13    2017- 19  1.20- 1.26    2020   1.44    Jan '21  3.07 >> 1.88   21 > 28    Feb '21  1.84- 2.04 +100      02/13/20  3.24        02/22/20 > now 3.15 >> 3.24 > 300  20 ml/min      Renal US 4/4 >   11.1/ 11.1 cm kidneys, ^'d bilat echotexture c/w chron medical disease, bilat cysts, no hydro     CXR 3/28 /21 > COPD, no active disease otherwise      ECHO Jan 2021 >  LVEF 60%, G1DD     Na 147  K 4.2 CO2 23  BUN 76  Cr 3.24  Alb 1.8  AST 31/ aLT 84  Tprot 4.0, Tbili 0.5  eGFR 20     WBC 5k  Hb 11.0   plt 131     MR Abd wo  3/31 - normal appearing kidneys w/ bilat cysts, no hydro     Assessment/ Plan: 1. Renal failure - progressive decline in renal fxn over last 1-2 years particularly.  Has proteinuria, will quantify  w/ spot prot-creat ratio. His eGFR is down to 20 ml/min now. Creat hasn't improved w/ IVF's and would dc IVF's now as he is edematous. RUS shows normal size, ^'d echo, no hydro.  UA no rbc's, +proteinuria.  May have neph syndrome.  Will send of some serologies.  Pt is not feeling well and I did not discuss any prognostics with him at this time.    2. BP/ volume - BP's normal , not on BP meds at home. Vol up now after IVf's here.  3. Nausea /vomiting - agree w/ EGD, should not be uremic yet w/ creat 3.0.  4. Hypoalbuminemia - again may have neph syndrome situation 5. Hypernatremia - dc'd all IVF's and started D5W for [place].       Rob Lavra Imler  MD 02/29/2020, 8:35 PM  Recent Labs  Lab 02/23/20 0521 02/29/20 0519  WBC 5.5 5.9  HGB 11.1* 11.0*   Recent Labs  Lab 02/23/20 0521 02/23/20 0521 02/23/20 1709 02/23/20 1709 02/24/20 0548 02/24/20 0548 02/25/20 0533 02/25/20 0533 02/26/20 0457 02/26/20 0457 02/27/20 0502 02/29/20 0519  K 4.1   < > 4.1   < > 4.1   < > 4.3   < > 4.3   < > 4.3 4.2  BUN 89*   < > 90*   < > 86*   < > 87*   < > 78*   < > 78* 76*  CREATININE 2.99*   < > 3.23*   < > 3.39*   < > 3.12*   < > 2.92*   < > 2.91* 3.24*  CALCIUM 8.5*  --  8.3*  --  8.3*  --  8.1*  --  8.2*  --  8.3* 8.1*   < > = values in this interval not displayed.

## 2020-03-01 ENCOUNTER — Encounter (HOSPITAL_COMMUNITY): Payer: Self-pay | Admitting: Internal Medicine

## 2020-03-01 ENCOUNTER — Inpatient Hospital Stay (HOSPITAL_COMMUNITY): Payer: Medicare HMO | Admitting: Anesthesiology

## 2020-03-01 ENCOUNTER — Encounter (HOSPITAL_COMMUNITY)
Admission: EM | Disposition: A | Discharge status: Hospice | Payer: Self-pay | Source: Home / Self Care | Attending: Internal Medicine

## 2020-03-01 HISTORY — PX: BIOPSY: SHX5522

## 2020-03-01 HISTORY — PX: ESOPHAGOGASTRODUODENOSCOPY (EGD) WITH PROPOFOL: SHX5813

## 2020-03-01 LAB — CBC
HCT: 33.5 % — ABNORMAL LOW (ref 39.0–52.0)
Hemoglobin: 11 g/dL — ABNORMAL LOW (ref 13.0–17.0)
MCH: 32.6 pg (ref 26.0–34.0)
MCHC: 32.8 g/dL (ref 30.0–36.0)
MCV: 99.4 fL (ref 80.0–100.0)
Platelets: 123 10*3/uL — ABNORMAL LOW (ref 150–400)
RBC: 3.37 MIL/uL — ABNORMAL LOW (ref 4.22–5.81)
RDW: 13.6 % (ref 11.5–15.5)
WBC: 5.7 10*3/uL (ref 4.0–10.5)
nRBC: 0 % (ref 0.0–0.2)

## 2020-03-01 LAB — BASIC METABOLIC PANEL
Anion gap: 6 (ref 5–15)
BUN: 70 mg/dL — ABNORMAL HIGH (ref 8–23)
CO2: 22 mmol/L (ref 22–32)
Calcium: 8.1 mg/dL — ABNORMAL LOW (ref 8.9–10.3)
Chloride: 117 mmol/L — ABNORMAL HIGH (ref 98–111)
Creatinine, Ser: 3.21 mg/dL — ABNORMAL HIGH (ref 0.61–1.24)
GFR calc Af Amer: 20 mL/min — ABNORMAL LOW (ref >60)
GFR calc non Af Amer: 17 mL/min — ABNORMAL LOW (ref >60)
Glucose, Bld: 111 mg/dL — ABNORMAL HIGH (ref 70–99)
Potassium: 4 mmol/L (ref 3.5–5.1)
Sodium: 145 mmol/L (ref 135–145)

## 2020-03-01 LAB — CREATININE, URINE, RANDOM: Creatinine, Urine: 125.57 mg/dL

## 2020-03-01 LAB — PROTEIN / CREATININE RATIO, URINE
Creatinine, Urine: 123.27 mg/dL
Protein Creatinine Ratio: 4.96 mg/mg{Cre} — ABNORMAL HIGH (ref 0.00–0.15)
Total Protein, Urine: 612 mg/dL

## 2020-03-01 LAB — SODIUM, URINE, RANDOM: Sodium, Ur: 34 mmol/L

## 2020-03-01 LAB — HIV ANTIBODY (ROUTINE TESTING W REFLEX): HIV Screen 4th Generation wRfx: NONREACTIVE

## 2020-03-01 SURGERY — ESOPHAGOGASTRODUODENOSCOPY (EGD) WITH PROPOFOL
Anesthesia: Monitor Anesthesia Care

## 2020-03-01 MED ORDER — PROPOFOL 10 MG/ML IV BOLUS
INTRAVENOUS | Status: AC
  Filled 2020-03-01: qty 40

## 2020-03-01 MED ORDER — PROPOFOL 1000 MG/100ML IV EMUL
INTRAVENOUS | Status: AC
  Filled 2020-03-01: qty 100

## 2020-03-01 MED ORDER — PANTOPRAZOLE SODIUM 40 MG IV SOLR
40.0000 mg | Freq: Two times a day (BID) | INTRAVENOUS | Status: DC
  Administered 2020-03-01 – 2020-03-03 (×6): 40 mg via INTRAVENOUS
  Filled 2020-03-01 (×6): qty 40

## 2020-03-01 MED ORDER — PROPOFOL 10 MG/ML IV BOLUS
INTRAVENOUS | Status: DC | PRN
  Administered 2020-03-01: 20 mg via INTRAVENOUS

## 2020-03-01 MED ORDER — SODIUM CHLORIDE 0.9 % IV SOLN: INTRAVENOUS | Status: DC

## 2020-03-01 MED ORDER — LACTATED RINGERS IV SOLN: INTRAVENOUS | Status: DC | PRN

## 2020-03-01 MED ORDER — PROPOFOL 500 MG/50ML IV EMUL
INTRAVENOUS | Status: DC | PRN
  Administered 2020-03-01: 125 ug/kg/min via INTRAVENOUS

## 2020-03-01 MED ORDER — SUCRALFATE 1 G PO TABS
1.0000 g | ORAL_TABLET | Freq: Three times a day (TID) | ORAL | Status: DC
  Administered 2020-03-01 – 2020-03-06 (×19): 1 g via ORAL
  Filled 2020-03-01 (×19): qty 1

## 2020-03-01 SURGICAL SUPPLY — 14 items

## 2020-03-01 NOTE — Care Management Important Message (Signed)
Important Message  Patient Details IM Letter given to Marney Doctor RN Case Manager to present to the Patient Name: Alvin Ingram MRN: [id number] Date of Birth: July 05, 1936   Medicare Important Message Given:  Yes     [name] 03/01/2020, 10:03 AM

## 2020-03-01 NOTE — Brief Op Note (Signed)
02/22/2020 - 03/01/2020  1:03 PM  PATIENT:  Alvin Ingram  84 y.o. male  PRE-OPERATIVE DIAGNOSIS:  Nausea abdominal pain weight loss  POST-OPERATIVE DIAGNOSIS:  esophagitis, gastritis  PROCEDURE:  Procedure(s): ESOPHAGOGASTRODUODENOSCOPY (EGD) WITH PROPOFOL (N/A) BIOPSY  SURGEON:  Surgeon(s) and Role:    * Tavia Stave, MD - Primary  Findings ---------- -EGD showed localized area of inflammation at distal esophagus.  Also showed severe gastritis with extrinsic compression in the gastric antrum.  No evidence of gastric outlet obstruction.  Biopsies taken  Recommendations -------------------------- -Start full liquid diet -Change Protonix to IV twice daily -Add Carafate -Follow biopsies -GI will follow  Otis Brace MD, [place] 03/01/2020, 1:04 PM  Contact #  [phone]

## 2020-03-01 NOTE — Progress Notes (Signed)
[facility] Progress Note  Subjective: EGD showed esophagitis and gastritis, creat unchanged  Vitals:   03/01/20 1300 03/01/20 1310 03/01/20 1314 03/01/20 1343  BP: 124/73 (!) 162/78  (!) 162/95  Pulse: 66 60 (!) 57 (!) 55  Resp: '17 20 10 15  ' Temp:    (!) 97.5 F (36.4 C)  TempSrc:    Oral  SpO2: 100% 100% 100% 100%  Weight:      Height:        Exam: Gen elderly AAM, wdwn , some wt loss, no distress, tired No rash, cyanosis or gangrene Sclera anicteric, throat clear and moist  No jvd or bruits Chest clear bilat to bases no rales, wheezing, bronch BS RRR no MRG Abd soft ntnd no mass or ascites +bs GU normal male w/ condom cath  MS no joint effusions or deformity Ext 1-2+ pretib edema, no wounds or ulcers Neuro is alert, Ox 3 , nf    Home meds:  - furosemide 40 qd  - simvastatin 20 hs/ aspirin 81  - elavil 25 hs  - prilosec 40 qd/ flomax 0.4  - prn's/ vitamins/ supplements    UA 3/28 -  > 300 proteinuria, 0-5 rbc/ wbc        Date                          Creat               Protein             eGFR    2009- 16                   1.01- 1.13    2017- 19                   1.20- 1.26    2020                         1.44    Jan '21                      3.07 >> 1.88                            21 > 28    Feb '21                     1.84- 2.04        +100                    02/13/20                     3.24                                             02/22/20 > now          3.15 >> 3.24    > 300               20 ml/min      Renal US 4/4 >   11.1/ 11.1 cm kidneys, ^'d bilat echotexture c/w chron medical disease, bilat cysts, no hydro     CXR 3/28 /21 > COPD,  no active disease otherwise      ECHO Jan 2021 >  LVEF 60%, G1DD     Na 147  K 4.2 CO2 23  BUN 76  Cr 3.24  Alb 1.8  AST 31/ aLT 84  Tprot 4.0, Tbili 0.5  eGFR 20     WBC 5k  Hb 11.0   plt 131     MR Abd wo 3/31 - normal appearing kidneys w/ bilat cysts, no hydro   Assessment/ Plan: 1. Renal  failure - progressive decline in renal fxn over last 1-2 years particularly.  Has proteinuria, awaiting urine studies. His eGFR is down to 20 ml/min now.  May have neph syndrome.  Had 1st visit appt at [place] for 3/31 but pt missed d/t illness. Have sent some serologies for prognostic purposes only, pt not a good candidate for immunosuppression given age/ frailty/ condition at this time. Will re-schedule office visit for 3-4 wks post dc (see dc section).  Would give pt po lasix 40 qd for 7 -10 days then stop. No further suggestions, will sign off.  2. BP/ volume - BP's normal , not on BP meds at home. LE edema here now. Start lasix po as above for limited course 3. Nausea /vomiting - EGD showing gastritis and esophagitis  4. Hypoalbuminemia - suspect neph syndrome +/- malnutrition, asked RN to send urine for studies today 5. Hypernatremia - cont D5W for ^^Na for now 6. Dispo - per primary       Rob Anneth Brunell 03/01/2020, 3:57 PM   Recent Labs  Lab 02/29/20 0519 03/01/20 0602  K 4.2 4.0  BUN 76* 70*  CREATININE 3.24* 3.21*  CALCIUM 8.1* 8.1*  HGB 11.0* 11.0*   Inpatient medications: . aspirin EC  81 mg Oral Daily  . feeding supplement  1 Container Oral TID BM  . feeding supplement (ENSURE ENLIVE)  237 mL Oral BID BM  . heparin  5,000 Units Subcutaneous Q8H  . multivitamin with minerals  1 tablet Oral Daily  . pantoprazole (PROTONIX) IV  40 mg Intravenous Q12H  . polyethylene glycol  17 g Oral BID  . senna-docusate  2 tablet Oral BID  . sertraline  25 mg Oral Daily  . sucralfate  1 g Oral TID WC & HS  . tamsulosin  0.4 mg Oral QPC supper   . dextrose 50 mL/hr at 03/01/20 0600   ondansetron (ZOFRAN) IV, promethazine

## 2020-03-01 NOTE — Anesthesia Procedure Notes (Signed)
Procedure Name: MAC Date/Time: 03/01/2020 12:32 PM Performed by: Claudia Desanctis, CRNA Pre-anesthesia Checklist: Patient identified, Emergency Drugs available, Suction available and Patient being monitored Patient Re-evaluated:Patient Re-evaluated prior to induction Oxygen Delivery Method: Simple face mask

## 2020-03-01 NOTE — Progress Notes (Signed)
PT Cancellation Note  Patient Details Name: Alvin Ingram MRN: [id number] DOB: May 14, 1936   Cancelled Treatment:    Reason Eval/Treat Not Completed: Patient at procedure or test/unavailable(Pt at endoscopy for EGD today. Will follow up at later date/time as schedul allows and pt able to participate.)  Verner Mould, DPT Physical Therapist with [facility] [phone]  03/01/2020 12:31 PM

## 2020-03-01 NOTE — Transfer of Care (Signed)
Immediate Anesthesia Transfer of Care Note  Patient: Alvin Ingram  Procedure(s) Performed: ESOPHAGOGASTRODUODENOSCOPY (EGD) WITH PROPOFOL (N/A ) BIOPSY  Patient Location: Endoscopy Unit  Anesthesia Type:MAC  Level of Consciousness: drowsy  Airway & Oxygen Therapy: Patient Spontanous Breathing and Patient connected to face mask  Post-op Assessment: Report given to RN and Post -op Vital signs reviewed and stable  Post vital signs: Reviewed and stable  Last Vitals:  Vitals Value Taken Time  BP    Temp    Pulse    Resp    SpO2      Last Pain:  Vitals:   03/01/20 1204  TempSrc: Oral  PainSc: 0-No pain         Complications: No apparent anesthesia complications

## 2020-03-01 NOTE — Anesthesia Preprocedure Evaluation (Addendum)
Anesthesia Evaluation  Patient identified by MRN, date of birth, ID band Patient awake    Reviewed: Allergy & Precautions, H&P , NPO status , Patient's Chart, lab work & pertinent test results, reviewed documented beta blocker date and time   Airway Mallampati: I  TM Distance: >3 FB Neck ROM: full    Dental no notable dental hx. (+) Missing, Poor Dentition   Pulmonary neg pulmonary ROS, former smoker,    Pulmonary exam normal breath sounds clear to auscultation       Cardiovascular Exercise Tolerance: Good hypertension, + Valvular Problems/Murmurs  Rhythm:regular Rate:Normal     Neuro/Psych negative neurological ROS  negative psych ROS   GI/Hepatic Neg liver ROS, GERD  Medicated,  Endo/Other  negative endocrine ROS  Renal/GU Renal disease  negative genitourinary   Musculoskeletal   Abdominal   Peds  Hematology negative hematology ROS (+)   Anesthesia Other Findings   Reproductive/Obstetrics negative OB ROS                           Anesthesia Physical Anesthesia Plan  ASA: III  Anesthesia Plan: MAC   Post-op Pain Management:    Induction: Intravenous  PONV Risk Score and Plan: 1  Airway Management Planned: Mask, Natural Airway and Nasal Cannula  Additional Equipment:   Intra-op Plan:   Post-operative Plan:   Informed Consent: I have reviewed the patients History and Physical, chart, labs and discussed the procedure including the risks, benefits and alternatives for the proposed anesthesia with the patient or authorized representative who has indicated his/her understanding and acceptance.     Dental Advisory Given  Plan Discussed with: CRNA and Anesthesiologist  Anesthesia Plan Comments:         Anesthesia Quick Evaluation

## 2020-03-01 NOTE — Progress Notes (Signed)
[facility] Progress Note  Alvin Ingram 84 y.o. [birth year]  CC: Nausea, vomiting, weight loss   Subjective: Patient seen and examined in endoscopy unit.  He continues to have nausea.  Complaining of trouble swallowing food with sensation of food getting stuck in the upper esophagus.     Objective: Vital signs in last 24 hours: Vitals:   03/01/20 0547 03/01/20 1204  BP: 134/81 (!) 149/89  Pulse: 67 72  Resp: 16 19  Temp: 98.4 F (36.9 C) 98.4 F (36.9 C)  SpO2: 97% 99%    Physical Exam:  General.  Elderly patient.  Not in acute distress Abdomen.  Soft, nontender, nondistended, bowel sounds present Neuro.  Alert/oriented x3 Psych.  Mood and affect normal  Lab Results: Recent Labs    02/29/20 0519 03/01/20 0602  NA 147* 145  K 4.2 4.0  CL 117* 117*  CO2 23 22  GLUCOSE 121* 111*  BUN 76* 70*  CREATININE 3.24* 3.21*  CALCIUM 8.1* 8.1*   Recent Labs    02/28/20 1153 02/29/20 0519  AST 56* 31  ALT 116* 84*  ALKPHOS 145* 133*  BILITOT 0.7 0.5  PROT 4.3* 4.0*  ALBUMIN 2.0* 1.8*   Recent Labs    02/29/20 0519 03/01/20 0602  WBC 5.9 5.7  NEUTROABS 4.0  --   HGB 11.0* 11.0*  HCT 33.0* 33.5*  MCV 98.8 99.4  PLT 131* 123*   No results for input(s): LABPROT, INR in the last 72 hours.    Assessment/Plan: -Nausea and vomiting.  Recent imaging work-up reviewed. -Dysphagia.  Pharyngoesophageal.  Sensation of food getting stuck in upper esophagus -Pancreatic cyst.  -Weight loss/failure to thrive -Abnormal LFTs.  Improving  Recommendations -------------------------- -Proceed with EGD today.  -Need for dilation if any esophageal narrowing discussed with the patient.  Patient is agreeable with dilation only if there is any esophageal stricture or ring.  Risks (bleeding, infection, bowel perforation that could require surgery, sedation-related changes in cardiopulmonary systems), benefits (identification and possible treatment of source of  symptoms, exclusion of certain causes of symptoms), and alternatives (watchful waiting, radiographic imaging studies, empiric medical treatment)  were explained to patient in detail and patient wishes to proceed.   Otis Brace MD, [place] 03/01/2020, 12:26 PM  Contact #  [phone]

## 2020-03-01 NOTE — Op Note (Signed)
[facility] Patient Name: Alvin Ingram Procedure Date: 03/01/2020 MRN: [id number] Attending MD: Otis Brace , MD Date of Birth: 1936/01/24 CSN: [id number] Age: 84 Admit Type: Inpatient Procedure:                Upper GI endoscopy Indications:              Nausea with vomiting, Weight loss Providers:                Otis Brace, MD, Elmer Ramp. Tilden Dome, RN, [employer], Technician, Janeece Agee, Technician Referring MD:              Medicines:                Sedation Administered by an Anesthesia Professional Complications:            No immediate complications. Estimated Blood Loss:     Estimated blood loss was minimal. Procedure:                Pre-Anesthesia Assessment:                           - Prior to the procedure, a History and Physical                            was performed, and patient medications and                            allergies were reviewed. The patient's tolerance of                            previous anesthesia was also reviewed. The risks                            and benefits of the procedure and the sedation                            options and risks were discussed with the patient.                            All questions were answered, and informed consent                            was obtained. Prior Anticoagulants: The patient has                            taken no previous anticoagulant or antiplatelet                            agents except for aspirin. ASA Grade Assessment:                            III - A patient with severe systemic disease. After  reviewing the risks and benefits, the patient was                            deemed in satisfactory condition to undergo the                            procedure.                           After obtaining informed consent, the endoscope was                            passed under direct vision. Throughout the                procedure, the patient's blood pressure, pulse, and                            oxygen saturations were monitored continuously. The                            upper GI endoscopy was accomplished without                            difficulty. The patient tolerated the procedure                            well. The Endoscope was introduced through the                            mouth, and advanced to the second part of duodenum. Scope In: Scope Out: Findings:      The Z-line was regular and was found 42 cm from the incisors.      LA Grade C (one or more mucosal breaks continuous between tops of 2 or       more mucosal folds, less than 75% circumference) esophagitis with no       bleeding was found 40 cm from the incisors. Biopsies were taken with a       cold forceps for histology.      Scattered severe inflammation with hemorrhage characterized by       congestion (edema), erosions, erythema and friability was found in the       gastric body and in the gastric antrum. Biopsies were taken with a cold       forceps for histology.      The cardia and gastric fundus were normal on retroflexion.      Extrinsic compression on the stomach was found in the gastric antrum.      The duodenal bulb, first portion of the duodenum and second portion of       the duodenum were normal. Impression:               - Z-line regular, 42 cm from the incisors.                           - LA Grade C esophagitis with no bleeding. Biopsied.                           -  Gastritis with hemorrhage. Biopsied.                           - Extrinsic compression in the gastric antrum.                           - Normal duodenal bulb, first portion of the                            duodenum and second portion of the duodenum. Moderate Sedation:      Moderate (conscious) sedation was personally administered by an       anesthesia professional. The following parameters were monitored: oxygen       saturation,  heart rate, blood pressure, and response to care. Recommendation:           - Return patient to hospital ward for ongoing care.                           - Advance diet as tolerated.                           - Continue present medications.                           - Await pathology results. Procedure Code(s):        --- Professional ---                           [phone], Esophagogastroduodenoscopy, flexible,                            transoral; with biopsy, single or multiple Diagnosis Code(s):        --- Professional ---                           K20.90, Esophagitis, unspecified without bleeding                           K29.71, Gastritis, unspecified, with bleeding                           K31.89, Other diseases of stomach and duodenum                           R11.2, Nausea with vomiting, unspecified                           R63.4, Abnormal weight loss CPT copyright 2019 [employer]. All rights reserved. The codes documented in this report are preliminary and upon coder review may  be revised to meet current compliance requirements. Otis Brace, MD Otis Brace, MD 03/01/2020 12:50:37 PM Number of Addenda: 0

## 2020-03-01 NOTE — Progress Notes (Signed)
PROGRESS NOTE  [name] NOB:[id number] DOB: [birth year] DOA: 02/22/2020 PCP: Binnie Rail, MD  HPI/Recap of past 24 hours: Alvin Ingram a 84 y.o.malewith medical history significant forBPH followed by urology, hyperlipidemia and GERD who presents to the ED as advised by his niece with concerns for decreased p.o. intake, suspected uncontrolled depression, weight loss, and generalized weakness.  Evaluated by his PCP about 2 weeks prior.  Labs showing mildly elevated LFTs.   On Elavil for depression which can cause elevation in LFTs.  A CT of his abdomen in February (01/02/20) showed extensive sigmoid diverticulosis and a 15 mm hypodense lesion of the distal body of the pancreas that was indeterminate.  MRI abdomen (02/25/20) showed a cystic lesion of the pancreas, with radiology recommendation for surveillance in 2 years.   Spoke with his niece via phone.  She is concerned that he might be depressed, recently lost a friend.  On Elavil for depression.  Admitted to feeling depressed without suicidal or homicidal ideation.  Seen by psych with recommendation to DC Elavil and start Zoloft.  Appetite is slowly improving.  Evaluated by PT OT with recommendation for SNF with 24-hour assistance/supervision.  Patient is receptive to going to SNF.  03/01/20:  Planned EGD today.  Not very talkative today.  Still have nausea intermittently.  Abdomen not as tender.    Assessment/Plan: Principal Problem:   Failure to thrive in adult Active Problems:   AKI (acute kidney injury) ([place])   Transaminitis   Weakness   Hypernatremia   Protein-calorie malnutrition, severe  Failure to thrive in adult/ Severe weight loss/ Depression Presented with poor oral intake, poor appetite, weight loss, uncontrolled depression Improving with change in psych medication Elavil DCed and started on Zoloft 02/25/20. He had excessive sedation on Elavil and elevation in his LFTs Continue to Encourage increase in oral protein  calorie intake Follow up with psych outpatient  Intractable nausea with transient vomiting Continue antiemetics PRN Appreciate GI's assistance Possible endoscopy 4/5 Follow results of EGD  AKI on CKD 3B Presented with creatinine 3.24, proteuniuria Baseline creatinine 2 with GFR 38 No significant improvement in his Cr UO 700cc recorded in last 24H Nephrology following along Urine lytes pending Renal US evidence of medical renal disease with multiple renal cysts bilaterally .  No evidence of obstruction. Continue to avoid nephrotoxins  Daily BMPs  Acute transaminitis in the setting of Elavil, improving Possibly iatrogenic, Elavil can increase LFTs Elevated alkaline phosphatase, AST and ALT, trending down T bili normal Right upper quadrant abdominal ultrasound 02/22/20 negative. No gallstones or wall thickening visualized. No sonographic Murphy sign noted by sonographer. Acute hepatitis panel negative Continue to avoid hepatotoxic agents  15 mm low attenuating lesion with fluid attenuation along the posterior distal body of the pancreas/ B/L renal cysts MRI shows cystic lesion of the pancreas, recommend surveillance withrepeat imagings in 2 years.  Resolved Hypovolemic hypernatremia with iV fluid Presented with serum sodium 153> 151>148> 147> 145 Avoid dehydration> on D5% at 50 cc/hr Encourage free fluid oral intake  Chronic diastolic CHF Hypervolemic on exam Last 2D echo done on 12/17/2019 showed LVEF 65-70% With grade 1 diastolic dysfunction Hold off lasix due to recent dehydration and AKI Mild increase in volume status, O2 sat 97% RA  R pleural effusion on renal US Incidental finding Monitor volume status on IV fluid Start incentive spirometer O2 sat 97% RA Maintain O2 sat>92%  GERD Continue p.o. PPI GI following along EGD 4/5  BPH Continue Flomax Follow up  with urology  Ambulatory dysfunction with frequent falls 3 falls at home within the last 2  weeks per his niece Alvin Ingram in the bathroom hospital 3/29, denies hitting his head Denies feeling dizzy prior to falling Lives alone, has not been able to appropriately take care of himself per his niece. Continue PT OT with assistance and fall precautions. TOC assisting with placement  Avascular necrosis of the left femoral neck without acute fracture or cortical collapse from CT abd/pelvis wo contrast 01/02/20. Fall precautions Follow up with your PCP     Code Status: Full   Consults called:Psych, GI, nephro   Disposition Plan: Patient is from home.  Anticipate discharge to SNF when symptomatology has improved, GI and nephrology have signed off.    Objective: Vitals:   02/29/20 0611 02/29/20 1325 02/29/20 2130 03/01/20 0547  BP: 136/81 139/89 129/82 134/81  Pulse: 80 74 66 67  Resp: 18 20 16 16   Temp: (!) 97.5 F (36.4 C) 97.8 F (36.6 C) 97.7 F (36.5 C) 98.4 F (36.9 C)  TempSrc: Oral Oral Oral Oral  SpO2: 98% 98% 99% 97%  Weight: 67.7 kg   66.2 kg  Height:        Intake/Output Summary (Last 24 hours) at 03/01/2020 1100 Last data filed at 03/01/2020 0600 Gross per 24 hour  Intake 360.82 ml  Output 700 ml  Net -339.18 ml   Filed Weights   02/26/20 0500 02/29/20 0611 03/01/20 0547  Weight: 63.5 kg 67.7 kg 66.2 kg    Exam: No changes from prior exam.  . General: 84 y.o. year-old male Frail appearing in no acute distress.  Alert and oriented x3.   . Cardiovascular: Regular rate and rhythm no rubs or gallops. Marland Kitchen Respiratory: Mild rales at bases no wheezing noted. . Abdomen: Soft normal bowel sounds present. . Musculoskeletal: Trace pitting edema in lower extremities bilaterally.   Marland Kitchen Psychiatry: Mood is appropriate for condition and setting.  Data Reviewed: CBC: Recent Labs  Lab 02/29/20 0519 03/01/20 0602  WBC 5.9 5.7  NEUTROABS 4.0  --   HGB 11.0* 11.0*  HCT 33.0* 33.5*  MCV 98.8 99.4  PLT 131* 643*   Basic Metabolic Panel: Recent Labs    Lab 02/25/20 0533 02/26/20 0457 02/27/20 0502 02/29/20 0519 03/01/20 0602  NA 148* 148* 149* 147* 145  K 4.3 4.3 4.3 4.2 4.0  CL 118* 118* 118* 117* 117*  CO2 24 25 24 23 22   GLUCOSE 109* 106* 101* 121* 111*  BUN 87* 78* 78* 76* 70*  CREATININE 3.12* 2.92* 2.91* 3.24* 3.21*  CALCIUM 8.1* 8.2* 8.3* 8.1* 8.1*   GFR: Estimated Creatinine Clearance: 16.9 mL/min (A) (by C-G formula based on SCr of 3.21 mg/dL (H)). Liver Function Tests: Recent Labs  Lab 02/24/20 0548 02/28/20 1153 02/29/20 0519  AST 61* 56* 31  ALT 147* 116* 84*  ALKPHOS 135* 145* 133*  BILITOT 0.7 0.7 0.5  PROT 4.3* 4.3* 4.0*  ALBUMIN 2.1* 2.0* 1.8*   Recent Labs  Lab 02/28/20 1156  LIPASE 19   No results for input(s): AMMONIA in the last 168 hours. Coagulation Profile: No results for input(s): INR, PROTIME in the last 168 hours. Cardiac Enzymes: No results for input(s): CKTOTAL, CKMB, CKMBINDEX, TROPONINI in the last 168 hours. BNP (last 3 results) Recent Labs    12/29/19 1639 02/13/20 1431  PROBNP 224.0* 84.0   HbA1C: No results for input(s): HGBA1C in the last 72 hours. CBG: No results for input(s): GLUCAP in the last  168 hours. Lipid Profile: No results for input(s): CHOL, HDL, LDLCALC, TRIG, CHOLHDL, LDLDIRECT in the last 72 hours. Thyroid Function Tests: No results for input(s): TSH, T4TOTAL, FREET4, T3FREE, THYROIDAB in the last 72 hours. Anemia Panel: No results for input(s): VITAMINB12, FOLATE, FERRITIN, TIBC, IRON, RETICCTPCT in the last 72 hours. Urine analysis:    Component Value Date/Time   COLORURINE YELLOW 02/22/2020 1540   APPEARANCEUR CLEAR 02/22/2020 1540   LABSPEC 1.014 02/22/2020 1540   PHURINE 6.0 02/22/2020 1540   GLUCOSEU NEGATIVE 02/22/2020 1540   GLUCOSEU NEGATIVE 01/02/2020 1028   HGBUR NEGATIVE 02/22/2020 1540   BILIRUBINUR NEGATIVE 02/22/2020 1540   KETONESUR NEGATIVE 02/22/2020 1540   PROTEINUR >=300 (A) 02/22/2020 1540   UROBILINOGEN 0.2 01/02/2020 1028    NITRITE NEGATIVE 02/22/2020 1540   LEUKOCYTESUR NEGATIVE 02/22/2020 1540   Sepsis Labs: @LABRCNTIP (procalcitonin:4,lacticidven:4)  ) Recent Results (from the past 240 hour(s))  SARS CORONAVIRUS 2 (TAT 6-24 HRS) Nasopharyngeal Nasopharyngeal Swab     Status: None   Collection Time: 02/22/20  6:00 PM   Specimen: Nasopharyngeal Swab  Result Value Ref Range Status   SARS Coronavirus 2 NEGATIVE NEGATIVE Final    Comment: (NOTE) SARS-CoV-2 target nucleic acids are NOT DETECTED. The SARS-CoV-2 RNA is generally detectable in upper and lower respiratory specimens during the acute phase of infection. Negative results do not preclude SARS-CoV-2 infection, do not rule out co-infections with other pathogens, and should not be used as the sole basis for treatment or other patient management decisions. Negative results must be combined with clinical observations, patient history, and epidemiological information. The expected result is Negative. Fact Sheet for Patients: SugarRoll.be Fact Sheet for Healthcare Providers: https://www.woods-mathews.com/ This test is not yet approved or cleared by the [place] FDA and  has been authorized for detection and/or diagnosis of SARS-CoV-2 by FDA under an Emergency Use Authorization (EUA). This EUA will remain  in effect (meaning this test can be used) for the duration of the COVID-19 declaration under Section 56 4(b)(1) of the Act, 21 U.S.C. section 360bbb-3(b)(1), unless the authorization is terminated or revoked sooner. Performed at [facility], [place] [address] [place], [place] [address]   SARS CORONAVIRUS 2 (TAT 6-24 HRS) Nasopharyngeal Nasopharyngeal Swab     Status: None   Collection Time: 02/27/20  6:32 PM   Specimen: Nasopharyngeal Swab  Result Value Ref Range Status   SARS Coronavirus 2 NEGATIVE NEGATIVE Final    Comment: (NOTE) SARS-CoV-2 target nucleic acids are NOT DETECTED. The  SARS-CoV-2 RNA is generally detectable in upper and lower respiratory specimens during the acute phase of infection. Negative results do not preclude SARS-CoV-2 infection, do not rule out co-infections with other pathogens, and should not be used as the sole basis for treatment or other patient management decisions. Negative results must be combined with clinical observations, patient history, and epidemiological information. The expected result is Negative. Fact Sheet for Patients: SugarRoll.be Fact Sheet for Healthcare Providers: https://www.woods-mathews.com/ This test is not yet approved or cleared by the [place] FDA and  has been authorized for detection and/or diagnosis of SARS-CoV-2 by FDA under an Emergency Use Authorization (EUA). This EUA will remain  in effect (meaning this test can be used) for the duration of the COVID-19 declaration under Section 56 4(b)(1) of the Act, 21 U.S.C. section 360bbb-3(b)(1), unless the authorization is terminated or revoked sooner. Performed at [facility], [place] [address] [place], [place] [address]       Studies: No results  found.  Scheduled Meds: . aspirin EC  81 mg Oral Daily  . feeding supplement  1 Container Oral TID BM  . feeding supplement (ENSURE ENLIVE)  237 mL Oral BID BM  . heparin  5,000 Units Subcutaneous Q8H  . multivitamin with minerals  1 tablet Oral Daily  . pantoprazole  40 mg Oral Daily  . polyethylene glycol  17 g Oral BID  . senna-docusate  2 tablet Oral BID  . sertraline  25 mg Oral Daily  . tamsulosin  0.4 mg Oral QPC supper    Continuous Infusions: . sodium chloride    . dextrose 50 mL/hr at 03/01/20 0600     LOS: 6 days     Kayleen Memos, MD [facility] Pager [phone]  If 7PM-7AM, please contact night-coverage [email or web address] Password [email or web address] 03/01/2020, 11:00 AM

## 2020-03-02 ENCOUNTER — Encounter (HOSPITAL_COMMUNITY): Payer: Self-pay | Admitting: Internal Medicine

## 2020-03-02 ENCOUNTER — Inpatient Hospital Stay (HOSPITAL_COMMUNITY): Payer: Medicare HMO

## 2020-03-02 ENCOUNTER — Other Ambulatory Visit: Payer: Self-pay

## 2020-03-02 DIAGNOSIS — R630 Anorexia: Secondary | ICD-10-CM

## 2020-03-02 DIAGNOSIS — Z515 Encounter for palliative care: Secondary | ICD-10-CM

## 2020-03-02 DIAGNOSIS — Z7189 Other specified counseling: Secondary | ICD-10-CM

## 2020-03-02 LAB — COMPREHENSIVE METABOLIC PANEL
ALT: 61 U/L — ABNORMAL HIGH (ref 0–44)
AST: 25 U/L (ref 15–41)
Albumin: 2 g/dL — ABNORMAL LOW (ref 3.5–5.0)
Alkaline Phosphatase: 128 U/L — ABNORMAL HIGH (ref 38–126)
Anion gap: 7 (ref 5–15)
BUN: 73 mg/dL — ABNORMAL HIGH (ref 8–23)
CO2: 24 mmol/L (ref 22–32)
Calcium: 8.3 mg/dL — ABNORMAL LOW (ref 8.9–10.3)
Chloride: 115 mmol/L — ABNORMAL HIGH (ref 98–111)
Creatinine, Ser: 3.03 mg/dL — ABNORMAL HIGH (ref 0.61–1.24)
GFR calc Af Amer: 21 mL/min — ABNORMAL LOW (ref >60)
GFR calc non Af Amer: 18 mL/min — ABNORMAL LOW (ref >60)
Glucose, Bld: 109 mg/dL — ABNORMAL HIGH (ref 70–99)
Potassium: 4.5 mmol/L (ref 3.5–5.1)
Sodium: 146 mmol/L — ABNORMAL HIGH (ref 135–145)
Total Bilirubin: 0.5 mg/dL (ref 0.3–1.2)
Total Protein: 4.2 g/dL — ABNORMAL LOW (ref 6.5–8.1)

## 2020-03-02 LAB — C4 COMPLEMENT: Complement C4, Body Fluid: 23 mg/dL (ref 12–38)

## 2020-03-02 LAB — CBC
HCT: 36.6 % — ABNORMAL LOW (ref 39.0–52.0)
Hemoglobin: 12.2 g/dL — ABNORMAL LOW (ref 13.0–17.0)
MCH: 33.1 pg (ref 26.0–34.0)
MCHC: 33.3 g/dL (ref 30.0–36.0)
MCV: 99.2 fL (ref 80.0–100.0)
Platelets: 135 10*3/uL — ABNORMAL LOW (ref 150–400)
RBC: 3.69 MIL/uL — ABNORMAL LOW (ref 4.22–5.81)
RDW: 13.5 % (ref 11.5–15.5)
WBC: 6.8 10*3/uL (ref 4.0–10.5)
nRBC: 0 % (ref 0.0–0.2)

## 2020-03-02 LAB — MPO/PR-3 (ANCA) ANTIBODIES
ANCA Proteinase 3: <3.5 U/mL (ref 0.0–3.5)
Myeloperoxidase Abs: <9 U/mL (ref 0.0–9.0)

## 2020-03-02 LAB — C3 COMPLEMENT: C3 Complement: 86 mg/dL (ref 82–167)

## 2020-03-02 LAB — SURGICAL PATHOLOGY

## 2020-03-02 NOTE — Progress Notes (Addendum)
[facility] Gastroenterology Progress Note  Hadyn Ingram 84 y.o. 14-Jun-1936  CC: Nausea, vomiting, weight loss  Subjective: Patient underwent EGD yesterday.  He continues to spit up mucus.  He has not seen any bloody mucus and denies nausea, vomiting, and hematemesis.  He denies chest pain, odynophagia, or abdominal pain.  ROS : Review of Systems  Respiratory: Negative for cough and shortness of breath.   Cardiovascular: Negative for chest pain and palpitations.  Gastrointestinal: Positive for nausea and vomiting. Negative for abdominal pain and blood in stool.    Objective: Vital signs in last 24 hours: Vitals:   03/02/20 0203 03/02/20 0549  BP: (!) 158/87 129/86  Pulse: 67 70  Resp: 18 14  Temp: (!) 97.4 F (36.3 C) (!) 97.4 F (36.3 C)  SpO2: 99% 99%    Physical Exam:  General:  Thin, Alert, cooperative, no distress, voice is hoarse  Head:  Normocephalic, without obvious abnormality, atraumatic  Eyes:  Anicteric sclera, lids and conjunctiva normal  Lungs:   Clear to auscultation bilaterally, respirations unlabored  Heart:  Regular rate and rhythm, S1, S2 normal  Abdomen:   Soft, non-tender, bowel sounds active all four quadrants,  no masses,   Extremities: Extremities normal, atraumatic, no  edema  Pulses: 2+ and symmetric    Lab Results: Recent Labs    03/01/20 0602 03/02/20 0524  NA 145 146*  K 4.0 4.5  CL 117* 115*  CO2 22 24  GLUCOSE 111* 109*  BUN 70* 73*  CREATININE 3.21* 3.03*  CALCIUM 8.1* 8.3*   Recent Labs    02/29/20 0519 03/02/20 0524  AST 31 25  ALT 84* 61*  ALKPHOS 133* 128*  BILITOT 0.5 0.5  PROT 4.0* 4.2*  ALBUMIN 1.8* 2.0*   Recent Labs    02/29/20 0519 02/29/20 0519 03/01/20 0602 03/02/20 0524  WBC 5.9   < > 5.7 6.8  NEUTROABS 4.0  --   --   --   HGB 11.0*   < > 11.0* 12.2*  HCT 33.0*   < > 33.5* 36.6*  MCV 98.8   < > 99.4 99.2  PLT 131*   < > 123* 135*   < > = values in this interval not displayed.   No results for input(s):  LABPROT, INR in the last 72 hours.  Assessment -EGD showed esophagitis and severe gastritis without gastric outlet obstruction.  Biopsies pending -Nausea/vomiting -Weight loss -Pancreatic cyst -Elevated LFTs, downtrending -Renal failure  Plan: -Continue full liquid diet -Continue IV Protonix twice daily -Continue Carafate -Await biopsies -ENT consulted due to voice hoarseness and continued sensation of something stuck in upper throat (EGD negative for stricture)  [facility] will follow.  Salley Slaughter PA-C 03/02/2020, 9:48 AM  Contact #  [phone]

## 2020-03-02 NOTE — Progress Notes (Signed)
PT Cancellation Note  Patient Details Name: Alvin Ingram MRN: [id number] DOB: Dec 06, 1935   Cancelled Treatment:    Reason Eval/Treat Not Completed: Fatigue/lethargy limiting ability to participate(Pt stated, "I just don't feel up to it" when asked if he would be willing to get out of bed. Benefits of mobility explained. Pt continued to decline PT. His mood seems depressed. He denied pain. Will follow.)   Philomena Doheny PT 03/02/2020  Acute Rehabilitation Services Pager [phone] Office [phone]

## 2020-03-02 NOTE — Anesthesia Postprocedure Evaluation (Signed)
Anesthesia Post Note  Patient: Alvin Ingram  Procedure(s) Performed: ESOPHAGOGASTRODUODENOSCOPY (EGD) WITH PROPOFOL (N/A ) BIOPSY     Patient location during evaluation: PACU Anesthesia Type: MAC Level of consciousness: awake and alert Pain management: pain level controlled Vital Signs Assessment: post-procedure vital signs reviewed and stable Respiratory status: spontaneous breathing, nonlabored ventilation, respiratory function stable and patient connected to nasal cannula oxygen Cardiovascular status: stable and blood pressure returned to baseline Postop Assessment: no apparent nausea or vomiting Anesthetic complications: no    Last Vitals:  Vitals:   03/02/20 0203 03/02/20 0549  BP: (!) 158/87 129/86  Pulse: 67 70  Resp: 18 14  Temp: (!) 36.3 C (!) 36.3 C  SpO2: 99% 99%    Last Pain:  Vitals:   03/02/20 0549  TempSrc: Oral  PainSc:    Pain Goal:                   Pina Sirianni

## 2020-03-02 NOTE — Consult Note (Signed)
ENT CONSULT:  Reason for Consult: Chronic hoarseness and dysphagia Referring Physician: GI service  Alvin Ingram is an 84 y.o. male.  HPI: Patient admitted with symptoms of abdominal pain, nausea and vomiting and weight loss.  He underwent EGD on 03/02/2020 with findings of esophagitis and hemorrhagic gastritis.  Concerns raised regarding patient's chronic ongoing dysphagia and hoarseness.  Previous smoker.  No significant laryngeal history, surgery or recent infection.  ENT service consulted for evaluation of upper airway, larynx and hypopharynx.  Patient complains of continued low-grade stomach pain, no sore throat, odynophagia or otalgia.  No active symptoms of aspiration or obstruction.  Past Medical History:  Diagnosis Date  . Cardiac murmur    Aortic  . Diverticulosis 2006  . GERD (gastroesophageal reflux disease)   . History of colonic polyps 2003,2011   Dr Earlean Shawl  . History of syncope 1966   ? stress related  . Hyperlipidemia   . Hypertension    hypertensive response @ stress test    Past Surgical History:  Procedure Laterality Date  . BIOPSY  03/01/2020   Procedure: BIOPSY;  Surgeon: Otis Brace, MD;  Location: [facility];  Service: Gastroenterology;;  . CATARACT EXTRACTION  2003 & 2011   Dr Charise Killian  . COLONOSCOPY  04/2005   tics, no polyps. Dr.Medoff  . COLONOSCOPY W/ POLYPECTOMY  2003, 2011   neg 2006; due 2016. Dr Earlean Shawl  . ESOPHAGOGASTRODUODENOSCOPY (EGD) WITH PROPOFOL N/A 03/01/2020   Procedure: ESOPHAGOGASTRODUODENOSCOPY (EGD) WITH PROPOFOL;  Surgeon: Otis Brace, MD;  Location: [facility];  Service: Gastroenterology;  Laterality: N/A;    Family History  Problem Relation Age of Onset  . Heart attack Mother 66  . Colon polyps Brother   . Pneumonia Brother   . Heart attack Brother 66  . Lung cancer Brother   . Other Father        Benign Brain Tumor  . Hypertension Brother   . Tuberculosis Paternal Uncle   . Other Maternal Grandmother   . Diabetes  Neg Hx   . Stroke Neg Hx     Social History:  reports that he quit smoking about 52 years ago. He has never used smokeless tobacco. He reports current alcohol use. He reports that he does not use drugs.  Allergies: No Known Allergies  Medications: I have reviewed the patient's current medications.  Results for orders placed or performed during the hospital encounter of 02/22/20 (from the past 48 hour(s))  C3 complement     Status: None   Collection Time: 03/01/20  5:40 AM  Result Value Ref Range   C3 Complement 86 82 - 167 mg/dL    Comment: (NOTE) Performed At: [facility] [place], [place] [id number] Rush Farmer MD [id number]   C4 complement     Status: None   Collection Time: 03/01/20  5:40 AM  Result Value Ref Range   Complement C4, Body Fluid 23 12 - 38 mg/dL    Comment: (NOTE) Performed At: [facility] [place], [place] [id number] Rush Farmer MD [id number]   Mpo/pr-3 (anca) antibodies     Status: None   Collection Time: 03/01/20  5:40 AM  Result Value Ref Range   Myeloperoxidase Abs <9.0 0.0 - 9.0 U/mL   ANCA Proteinase 3 <3.5 0.0 - 3.5 U/mL    Comment: (NOTE) Performed At: [facility] [address] [place], [place] [id number] Rush Farmer MD [id number]   HIV Antibody (routine testing w rflx)     Status:  None   Collection Time: 03/01/20  5:40 AM  Result Value Ref Range   HIV Screen 4th Generation wRfx NON REACTIVE NON REACTIVE    Comment: Performed at [facility], [address] [place], [place] [address]  CBC     Status: Abnormal   Collection Time: 03/01/20  6:02 AM  Result Value Ref Range   WBC 5.7 4.0 - 10.5 K/uL   RBC 3.37 (L) 4.22 - 5.81 MIL/uL   Hemoglobin 11.0 (L) 13.0 - 17.0 g/dL   HCT 33.5 (L) 39.0 - 52.0 %   MCV 99.4 80.0 - 100.0 fL   MCH 32.6 26.0 - 34.0 pg   MCHC 32.8 30.0 - 36.0 g/dL   RDW 13.6 11.5 - 15.5 %   Platelets 123 (L) 150 - 400 K/uL   nRBC 0.0 0.0 - 0.2  %    Comment: Performed at [facility], [place] [address] [place], [place] [address]  Basic metabolic panel     Status: Abnormal   Collection Time: 03/01/20  6:02 AM  Result Value Ref Range   Sodium 145 135 - 145 mmol/L   Potassium 4.0 3.5 - 5.1 mmol/L   Chloride 117 (H) 98 - 111 mmol/L   CO2 22 22 - 32 mmol/L   Glucose, Bld 111 (H) 70 - 99 mg/dL    Comment: Glucose reference range applies only to samples taken after fasting for at least 8 hours.   BUN 70 (H) 8 - 23 mg/dL   Creatinine, Ser 3.21 (H) 0.61 - 1.24 mg/dL   Calcium 8.1 (L) 8.9 - 10.3 mg/dL   GFR calc non Af Amer 17 (L) >60 mL/min   GFR calc Af Amer 20 (L) >60 mL/min   Anion gap 6 5 - 15    Comment: Performed at [facility], [place] [address] [place], [place] [address]  Surgical pathology     Status: None   Collection Time: 03/01/20 12:37 PM  Result Value Ref Range   SURGICAL PATHOLOGY      SURGICAL PATHOLOGY CASE: [phone] PATIENT: Alvin Ingram Surgical Pathology Report     Clinical History: Nausea, abdominal pain, weight loss (cm)     FINAL MICROSCOPIC DIAGNOSIS:  A. STOMACH, BIOPSY: - Mild reactive gastropathy. - Warthin-Starry is negative for Helicobacter pylori. - No intestinal metaplasia, dysplasia, or malignancy.  B. ESOPHAGUS, BIOPSY: - Benign squamous mucosa. - No increase in intraepithelial eosinophils. - No intestinal metaplasia, dysplasia, or malignancy.   GROSS DESCRIPTION:  A: Received in formalin are tan, soft tissue fragments that are submitted in toto. Number: 3 size: Range from 0.4 to 0.6 cm blocks: 1  B: Received in formalin are tan, soft tissue fragments that are submitted in toto. Number: 3 size: Range from 0.3 to 0.4 cm blocks: 1 [name] 03/02/2020)    Final Diagnosis performed by Vicente Males, MD.   Electronically signed 03/02/2020 Technical and / or Professional components performed at [place], [place] [address],  [place], [place] [address].  Immunohistochemistry Technical component (if applicable) was performed at [facility]. [address], [place], [place], [place] [address].   IMMUNOHISTOCHEMISTRY DISCLAIMER (if applicable): Some of these immunohistochemical stains may have been developed and the performance characteristics determine by [facility]. Some may not have been cleared or approved by the U.S. Food and Drug Administration. The FDA has determined that such clearance or approval is not necessary. This test is used for clinical purposes. It  should not be regarded as investigational or for research. This laboratory is certified under the [place] (CLIA-88) as qualified to perform high complexity clinical laboratory testing.  The controls stained appropriately.   Protein / creatinine ratio, urine     Status: Abnormal   Collection Time: 03/01/20  2:45 PM  Result Value Ref Range   Creatinine, Urine 123.27 mg/dL   Total Protein, Urine 612 mg/dL    Comment: NO NORMAL RANGE ESTABLISHED FOR THIS TEST RESULTS CONFIRMED BY MANUAL DILUTION    Protein Creatinine Ratio 4.96 (H) 0.00 - 0.15 mg/mg[Cre]    Comment: Performed at [place] [address] [place], [place] [address]  Creatinine, urine, random     Status: None   Collection Time: 03/01/20  4:45 PM  Result Value Ref Range   Creatinine, Urine 125.57 mg/dL    Comment: Performed at [facility], [place] [address] [place], [place] [address]  Sodium, urine, random     Status: None   Collection Time: 03/01/20  4:45 PM  Result Value Ref Range   Sodium, Ur 34 mmol/L    Comment: Performed at [facility], [place] [address], [place], [place] [address]  CBC     Status: Abnormal   Collection Time: 03/02/20  5:24 AM  Result Value Ref Range   WBC 6.8 4.0 - 10.5 K/uL   RBC 3.69 (L) 4.22 - 5.81 MIL/uL   Hemoglobin 12.2 (L)  13.0 - 17.0 g/dL   HCT 36.6 (L) 39.0 - 52.0 %   MCV 99.2 80.0 - 100.0 fL   MCH 33.1 26.0 - 34.0 pg   MCHC 33.3 30.0 - 36.0 g/dL   RDW 13.5 11.5 - 15.5 %   Platelets 135 (L) 150 - 400 K/uL   nRBC 0.0 0.0 - 0.2 %    Comment: Performed at [facility], [place] [address] [place], [place] [address]  Comprehensive metabolic panel     Status: Abnormal   Collection Time: 03/02/20  5:24 AM  Result Value Ref Range   Sodium 146 (H) 135 - 145 mmol/L   Potassium 4.5 3.5 - 5.1 mmol/L   Chloride 115 (H) 98 - 111 mmol/L   CO2 24 22 - 32 mmol/L   Glucose, Bld 109 (H) 70 - 99 mg/dL    Comment: Glucose reference range applies only to samples taken after fasting for at least 8 hours.   BUN 73 (H) 8 - 23 mg/dL   Creatinine, Ser 3.03 (H) 0.61 - 1.24 mg/dL   Calcium 8.3 (L) 8.9 - 10.3 mg/dL   Total Protein 4.2 (L) 6.5 - 8.1 g/dL   Albumin 2.0 (L) 3.5 - 5.0 g/dL   AST 25 15 - 41 U/L   ALT 61 (H) 0 - 44 U/L   Alkaline Phosphatase 128 (H) 38 - 126 U/L   Total Bilirubin 0.5 0.3 - 1.2 mg/dL   GFR calc non Af Amer 18 (L) >60 mL/min   GFR calc Af Amer 21 (L) >60 mL/min   Anion gap 7 5 - 15    Comment: Performed at [facility], [place] [address], [place], [place] [address]    CT SOFT TISSUE NECK WO CONTRAST  Result Date: 03/02/2020 CLINICAL DATA:  Dysphagia. EGD with biopsy yesterday. Esophagitis and hemorrhagic gastritis. Nausea vomiting and weight loss. EXAM: CT NECK WITHOUT CONTRAST TECHNIQUE: Multidetector CT imaging of the neck was performed following the standard protocol without intravenous  contrast. COMPARISON:  None. FINDINGS: Pharynx and larynx: Asymmetric enlargement of the left laryngeal ventricle. Thinning of the left vocal cord. Mild thickening of the left aryepiglottic fold. 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 Correlate with hoarseness. No mass lesion identified. Epiglottis and pharynx otherwise normal. Salivary glands: No inflammation, mass, or stone.  Thyroid: Negative Lymph nodes: No enlarged lymph nodes in the neck. Vascular: Limited vascular evaluation without intravenous contrast. Limited intracranial: Negative Visualized orbits: Bilateral cataract extraction.  No orbital mass. Mastoids and visualized paranasal sinuses: Mild mucosal edema paranasal sinuses. Air-fluid level left sphenoid sinus. Bony thickening left sphenoid sinus. Mastoid clear bilaterally. Skeleton: Cervical spondylosis with disc and facet degeneration at multiple levels. No acute skeletal abnormality. Upper chest: Visualized lung apices clear bilaterally. Mild apical scarring bilaterally. 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 Correlate with hoarseness. Electronically Signed   By: Franchot Gallo M.D.   On: 03/02/2020 15:49    ROS:ROS 12 systems reviewed and negative except as stated in HPI   Blood pressure 129/77, pulse 65, temperature (!) 97.5 F (36.4 C), temperature source Oral, resp. rate 18, height 6\' 1"  (1.854 m), weight 66 kg, SpO2 99 %.  PHYSICAL EXAM: General appearance - alert, well appearing, and in no distress Nose -nasal crusting with some irritation, no mass or polyp, no discharge. Mouth -normal oral mucosa, no ulcer, mass or lesion.  Normal tongue and palatal mobility. Neck - supple, no significant adenopathy   Procedure Note: Flexible Laryngoscopy  Risks/benefits and possible complications were discussed in detail. Patient understands and agrees to proceed with procedure.   Procedure: 4 mm flexible laryngoscope inserted through the nasal passageway with minimal discomfort. Nasal cavity and nasopharynx patent without discharge, mass or polyp. Normal base of tongue and supraglottis, normal vocal cord mobility. No evidence of vocal cord paralysis, mass or lesion.  Mild bilateral vocal cord atrophy without evidence of mucosal lesion. Normal hypopharynx without evidence of mass  or aspiration.  Patient tolerated procedure without complication or difficulty.  Early Chars. Wilburn Cornelia, M.D.    Studies Reviewed: Noncontrasted CT scan of the neck from 03/02/2020 is reviewed in detail.  No evidence of mass or tumor, possible atrophy of the left vocal cord and laryngeal ventricle noted by the radiologist.  No evidence of mass or lesion.  No cervical adenopathy.    Assessment/Plan: ENT service consulted for evaluation of chronic hoarseness and dysphagia with weight loss, nausea and vomiting and abdominal pain.  CT scan reviewed above, no evidence of mass or lesion no adenopathy.  Patient has chronic hoarseness and mild cough without symptoms of aspiration.  Flexible laryngoscopy performed today shows normal bilateral vocal cord mobility without evidence of vocal cord nodule, mass or lesion hypopharynx is clear.  Patient may have mild vocal cord atrophy related to age changes.  No evidence of ulcer, mass or lesion.  Patient with a history of chronic hoarseness, no vocal cord pathology noted on today's laryngoscopy.  Swallowing issues may be more related to chronic nausea and vomiting and GI issues.  No evidence of mucosal lesion in the upper airway, hypopharynx or larynx.  Patient reassured regarding these findings.  No further work-up or treatment at this time.  Monitor for additional concerns and reconsult ENT as needed.  Jerrell Belfast 03/02/2020, 4:25 PM

## 2020-03-02 NOTE — Progress Notes (Signed)
PROGRESS NOTE  Alvin Ingram OFH:[id number] DOB: 08-11-1936 DOA: 02/22/2020 PCP: Binnie Rail, MD  HPI/Recap of past 24 hours: [name] a 84 y.o.malewith medical history significant forBPH followed by urology, hyperlipidemia and GERD who presents to [facility] ED as advised by his niece with concerns for decreased p.o. intake, suspected uncontrolled depression, weight loss, and generalized weakness.  Evaluated by his PCP about 2 weeks prior.  Labs showing mildly elevated LFTs.   On Elavil for depression which can cause elevation in LFTs.  A CT of his abdomen in February (01/02/20) showed extensive sigmoid diverticulosis and a 15 mm hypodense lesion of the distal body of the pancreas that was indeterminate.  MRI abdomen (02/25/20) showed a cystic lesion of the pancreas, with radiology recommendation for surveillance in 2 years.   Spoke with his niece via phone.  She is concerned that he might be depressed, recently lost a friend.  On Elavil for depression.  Admitted to feeling depressed without suicidal or homicidal ideation.  Seen by psych with recommendation to DC Elavil and start Zoloft.  Appetite poor.  Evaluated by PT OT with recommendation for SNF with 24-hour assistance/supervision.  Receptive to going to SNF.  GI consulted.  Had EGD done 4/5 showing esophagitis and hemorrhagic gastritis.  Biopsies pending.  Also seen by ENT due to concern for hoarseness in the setting of significant weight loss in a former smoker.  No significant findings.  03/02/20:  States he feels the same, fatigue with nausea.  No abdominal pain.     Assessment/Plan: Principal Problem:   Failure to thrive in adult Active Problems:   AKI (acute kidney injury) ([place])   Transaminitis   Weakness   Hypernatremia   Protein-calorie malnutrition, severe  Failure to thrive in adult/ Severe weight loss/ Depression Presented with poor oral intake, poor appetite, weight loss, uncontrolled depression Elavil DCed and started  on Zoloft 02/25/20. He had excessive sedation on Elavil and elevation in his LFTs Encourage increase in oral protein calorie intake Follow up with psych outpatient  Esophagitis/hemorrhagic gastritis Management directed by GI Continue IV PPI twice daily and Carafate  Intractable nausea with transient vomiting Continue antiemetics PRN Continue recommendations per GI.  AKI on CKD 3B Presented with creatinine 3.24, proteuniuria Baseline creatinine 2 with GFR 38 Creatinine trending down 3.0 with GFR of 21 Continue to avoid nephrotoxins Continue to monitor urine output Follow recommendations per nephrology Repeat BMP in the morning  Acute transaminitis in the setting of Elavil, improving Possibly iatrogenic, Elavil can increase LFTs LFTs are trending down Right upper quadrant abdominal ultrasound 02/22/20 negative. No gallstones or wall thickening visualized. No sonographic Murphy sign noted by sonographer. Acute hepatitis panel negative Continue to avoid hepatotoxic agents  15 mm low attenuating lesion with fluid attenuation along the posterior distal body of the pancreas/ B/L renal cysts MRI shows cystic lesion of the pancreas, recommend surveillance withrepeat imagings in 2 years.  Resolved Hypovolemic hypernatremia with iV fluid Presented with serum sodium 153> 151>148> 147> 145 Improved on D5% at 50 cc/hr Encourage free fluid oral intake Monitor volume status while on IV fluid.  Chronic diastolic CHF Last 2D echo done on 12/17/2019 showed LVEF 65-70% With grade 1 diastolic dysfunction Hold off lasix due to recent dehydration and AKI Continue strict I's and O's and daily weight  R pleural effusion on renal US Incidental finding Monitor volume status on IV fluid Continue incentive spirometer Maintain O2 sat>92%  GERD Continue IV PPI as recommended by GI Post  EGD 03/01/2020 by Dr. Regina Eck with findings as stated above  BPH Continue Flomax Follow up with  urology  Ambulatory dysfunction with frequent falls 3 falls at home within the last 2 weeks per his niece [name] in the bathroom hospital 3/29, denies hitting his head Denies feeling dizzy prior to falling Lives alone, has not been able to appropriately take care of himself per his niece. Continue PT OT with assistance and fall precautions. TOC assisting with SNF placement  Avascular necrosis of the left femoral neck without acute fracture or cortical collapse from CT abd/pelvis wo contrast 01/02/20. Fall precautions Follow up with your PCP     Code Status: Full   Consults called:Psych, GI, nephro   Disposition Plan: Patient is from home.  Anticipate discharge to SNF when symptomatology has improved, GI signs off.    Objective: Vitals:   03/02/20 0203 03/02/20 0500 03/02/20 0549 03/02/20 1318  BP: (!) 158/87  129/86 129/77  Pulse: 67  70 65  Resp: 18  14 18   Temp: (!) 97.4 F (36.3 C)  (!) 97.4 F (36.3 C) (!) 97.5 F (36.4 C)  TempSrc: Oral  Oral Oral  SpO2: 99%  99% 99%  Weight:  66 kg    Height:        Intake/Output Summary (Last 24 hours) at 03/02/2020 1645 Last data filed at 03/02/2020 0200 Gross per 24 hour  Intake 282.24 ml  Output 300 ml  Net -17.76 ml   Filed Weights   03/01/20 0547 03/01/20 1204 03/02/20 0500  Weight: 66.2 kg 66.2 kg 66 kg    Exam: No changes from prior exam.  . General: 84 y.o. year-old male Frail in no acute distress.  Alert and oriented x3. . Cardiovascular: Regular rate and rhythm no rubs or gallops.   Marland Kitchen Respiratory: Mild rales at bases no wheezing noted. . Abdomen: Soft nontender normal bowel sounds present.   . Musculoskeletal: Trace edema in lower extremities bilaterally.   Marland Kitchen Psychiatry: Mood is appropriate for condition and setting.   Data Reviewed: CBC: Recent Labs  Lab 02/29/20 0519 03/01/20 0602 03/02/20 0524  WBC 5.9 5.7 6.8  NEUTROABS 4.0  --   --   HGB 11.0* 11.0* 12.2*  HCT 33.0* 33.5* 36.6*  MCV  98.8 99.4 99.2  PLT 131* 123* 540*   Basic Metabolic Panel: Recent Labs  Lab 02/26/20 0457 02/27/20 0502 02/29/20 0519 03/01/20 0602 03/02/20 0524  NA 148* 149* 147* 145 146*  K 4.3 4.3 4.2 4.0 4.5  CL 118* 118* 117* 117* 115*  CO2 25 24 23 22 24   GLUCOSE 106* 101* 121* 111* 109*  BUN 78* 78* 76* 70* 73*  CREATININE 2.92* 2.91* 3.24* 3.21* 3.03*  CALCIUM 8.2* 8.3* 8.1* 8.1* 8.3*   GFR: Estimated Creatinine Clearance: 17.8 mL/min (A) (by C-G formula based on SCr of 3.03 mg/dL (H)). Liver Function Tests: Recent Labs  Lab 02/28/20 1153 02/29/20 0519 03/02/20 0524  AST 56* 31 25  ALT 116* 84* 61*  ALKPHOS 145* 133* 128*  BILITOT 0.7 0.5 0.5  PROT 4.3* 4.0* 4.2*  ALBUMIN 2.0* 1.8* 2.0*   Recent Labs  Lab 02/28/20 1156  LIPASE 19   No results for input(s): AMMONIA in the last 168 hours. Coagulation Profile: No results for input(s): INR, PROTIME in the last 168 hours. Cardiac Enzymes: No results for input(s): CKTOTAL, CKMB, CKMBINDEX, TROPONINI in the last 168 hours. BNP (last 3 results) Recent Labs    12/29/19 1639 02/13/20 1431  PROBNP  224.0* 84.0   HbA1C: No results for input(s): HGBA1C in the last 72 hours. CBG: No results for input(s): GLUCAP in the last 168 hours. Lipid Profile: No results for input(s): CHOL, HDL, LDLCALC, TRIG, CHOLHDL, LDLDIRECT in the last 72 hours. Thyroid Function Tests: No results for input(s): TSH, T4TOTAL, FREET4, T3FREE, THYROIDAB in the last 72 hours. Anemia Panel: No results for input(s): VITAMINB12, FOLATE, FERRITIN, TIBC, IRON, RETICCTPCT in the last 72 hours. Urine analysis:    Component Value Date/Time   COLORURINE YELLOW 02/22/2020 1540   APPEARANCEUR CLEAR 02/22/2020 1540   LABSPEC 1.014 02/22/2020 1540   PHURINE 6.0 02/22/2020 1540   GLUCOSEU NEGATIVE 02/22/2020 1540   GLUCOSEU NEGATIVE 01/02/2020 1028   HGBUR NEGATIVE 02/22/2020 1540   BILIRUBINUR NEGATIVE 02/22/2020 1540   KETONESUR NEGATIVE 02/22/2020 1540     PROTEINUR >=300 (A) 02/22/2020 1540   UROBILINOGEN 0.2 01/02/2020 1028   NITRITE NEGATIVE 02/22/2020 1540   LEUKOCYTESUR NEGATIVE 02/22/2020 1540   Sepsis Labs: @LABRCNTIP (procalcitonin:4,lacticidven:4)  ) Recent Results (from the past 240 hour(s))  SARS CORONAVIRUS 2 (TAT 6-24 HRS) Nasopharyngeal Nasopharyngeal Swab     Status: None   Collection Time: 02/22/20  6:00 PM   Specimen: Nasopharyngeal Swab  Result Value Ref Range Status   SARS Coronavirus 2 NEGATIVE NEGATIVE Final    Comment: (NOTE) SARS-CoV-2 target nucleic acids are NOT DETECTED. The SARS-CoV-2 RNA is generally detectable in upper and lower respiratory specimens during the acute phase of infection. Negative results do not preclude SARS-CoV-2 infection, do not rule out co-infections with other pathogens, and should not be used as the sole basis for treatment or other patient management decisions. Negative results must be combined with clinical observations, patient history, and epidemiological information. The expected result is Negative. Fact Sheet for Patients: SugarRoll.be Fact Sheet for Healthcare Providers: https://www.woods-mathews.com/ This test is not yet approved or cleared by the [place] FDA and  has been authorized for detection and/or diagnosis of SARS-CoV-2 by FDA under an Emergency Use Authorization (EUA). This EUA will remain  in effect (meaning this test can be used) for the duration of the COVID-19 declaration under Section 56 4(b)(1) of the Act, 21 U.S.C. section 360bbb-3(b)(1), unless the authorization is terminated or revoked sooner. Performed at [facility], [place] [address] [place], [place] [address]   SARS CORONAVIRUS 2 (TAT 6-24 HRS) Nasopharyngeal Nasopharyngeal Swab     Status: None   Collection Time: 02/27/20  6:32 PM   Specimen: Nasopharyngeal Swab  Result Value Ref Range Status   SARS Coronavirus 2 NEGATIVE NEGATIVE Final     Comment: (NOTE) SARS-CoV-2 target nucleic acids are NOT DETECTED. The SARS-CoV-2 RNA is generally detectable in upper and lower respiratory specimens during the acute phase of infection. Negative results do not preclude SARS-CoV-2 infection, do not rule out co-infections with other pathogens, and should not be used as the sole basis for treatment or other patient management decisions. Negative results must be combined with clinical observations, patient history, and epidemiological information. The expected result is Negative. Fact Sheet for Patients: SugarRoll.be Fact Sheet for Healthcare Providers: https://www.woods-mathews.com/ This test is not yet approved or cleared by the [place] FDA and  has been authorized for detection and/or diagnosis of SARS-CoV-2 by FDA under an Emergency Use Authorization (EUA). This EUA will remain  in effect (meaning this test can be used) for the duration of the COVID-19 declaration under Section 56 4(b)(1) of the Act, 21 U.S.C. section 360bbb-3(b)(1), unless the authorization is terminated  or revoked sooner. Performed at [facility], [place] [address] [place], [place] [address]       Studies: CT SOFT TISSUE NECK WO CONTRAST  Result Date: 03/02/2020 CLINICAL DATA:  Dysphagia. EGD with biopsy yesterday. Esophagitis and hemorrhagic gastritis. Nausea vomiting and weight loss. EXAM: CT NECK WITHOUT CONTRAST TECHNIQUE: Multidetector CT imaging of the neck was performed following the standard protocol without intravenous contrast. COMPARISON:  None. FINDINGS: Pharynx and larynx: Asymmetric enlargement of the left laryngeal ventricle. Thinning of the left vocal cord. Mild thickening of the left aryepiglottic fold. 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 Correlate with hoarseness. No mass lesion identified. Epiglottis and pharynx otherwise normal. Salivary glands: No inflammation, mass, or stone. Thyroid:  Negative Lymph nodes: No enlarged lymph nodes in the neck. Vascular: Limited vascular evaluation without intravenous contrast. Limited intracranial: Negative Visualized orbits: Bilateral cataract extraction.  No orbital mass. Mastoids and visualized paranasal sinuses: Mild mucosal edema paranasal sinuses. Air-fluid level left sphenoid sinus. Bony thickening left sphenoid sinus. Mastoid clear bilaterally. Skeleton: Cervical spondylosis with disc and facet degeneration at multiple levels. No acute skeletal abnormality. Upper chest: Visualized lung apices clear bilaterally. Mild apical scarring bilaterally. 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 Correlate with hoarseness. Electronically Signed   By: Franchot Gallo M.D.   On: 03/02/2020 15:49    Scheduled Meds: . aspirin EC  81 mg Oral Daily  . feeding supplement  1 Container Oral TID BM  . feeding supplement (ENSURE ENLIVE)  237 mL Oral BID BM  . heparin  5,000 Units Subcutaneous Q8H  . multivitamin with minerals  1 tablet Oral Daily  . pantoprazole (PROTONIX) IV  40 mg Intravenous Q12H  . polyethylene glycol  17 g Oral BID  . senna-docusate  2 tablet Oral BID  . sertraline  25 mg Oral Daily  . sucralfate  1 g Oral TID WC & HS  . tamsulosin  0.4 mg Oral QPC supper    Continuous Infusions: . dextrose 50 mL/hr at 03/02/20 0500     LOS: 7 days     Kayleen Memos, MD [facility] Pager [phone]  If 7PM-7AM, please contact night-coverage [email or web address] Password [facility] 03/02/2020, 4:45 PM

## 2020-03-02 NOTE — Progress Notes (Signed)
OT Cancellation Note  Patient Details Name: Traquan Duarte MRN: [id number] DOB: 1936/08/04   Cancelled Treatment:    Reason Eval/Treat Not Completed: Patient declined, no reason specified. Pt declined engagement in OT, reports "I just need to rest".  Reports depressed. Will follow and see as able.   Jolaine Artist, OT Acute Rehabilitation Services Pager [phone] Office [phone]    Delight Stare 03/02/2020, 2:13 PM

## 2020-03-02 NOTE — Consult Note (Signed)
Palliative care consult note  Reason for consult: Goals of care in light of failure to thrive  Palliative care consult received.  Chart reviewed including personal review of pertinent labs and imaging.  Discussed with bedside care team.  Briefly, Alvin Ingram is an 84 year old male with past medical history of BPH who is followed by urology, hyperlipidemia and GERD who presented to [facility] long due to decreased in take with weight loss, weakness, and uncontrolled depression.  He was evaluated by PCP and had abnormal LFTs.  CT of his abdomen showed diverticulosis and nonspecific pancreas mass.  He is noted by family to have recently lost a friend and concern is high for depression.  He was evaluated by psychiatry who transition from Elavil to Zoloft.  His appetite remains poor, however, he was just recently started on SSRI which would take several weeks to determine efficacy.  He is status post EGD with biopsies pending.  I met today with Alvin Ingram.  He was awake and alert but lying in bed with his eyes closed.  He reports being tired today and is minimally engaged in conversation with me.  I introduced palliative care as specialized medical care for people living with serious illness. It focuses on providing relief from the symptoms and stress of a serious illness. The goal is to improve quality of life for both the patient and the family.  We discussed clinical course as well as wishes moving forward in regard to  care plan this hospitalization.  Alvin Ingram is not very engaged in conversation but he does report understanding his current situation and concern with his decreased intake and poor nutritional status.  We discussed care plan to this point in time including EGD with biopsies pending.  Values and goals of care important to patient and family were attempted to be elicited.  -Throughout encounter today, Alvin Ingram reported over and over that he was tired he did not really feel up to talking.  He  does appear to be significantly depressed and I recommend continuing to work on treating this prior to further discussion regarding long-term goals of care. -We reviewed his clinical course this hospitalization.  After discussion, he endorses that he needs to find out results of biopsy and see how he does with rehab prior to making any other decisions about long-term goals of care. -It appears to me that a lot of his decline may be driven largely by his depression.  I would recommend he continue to be followed by psychiatry.  He did have medication changes made this admission, but unfortunately it will probably take several weeks to determine if changes that were made here in the hospital are effective for him. -Recommend he be followed by outpatient palliative care when he discharges from the hospital. -Please call if there are other concerns with which we can be of assistance in the care of Alvin Ingram at this time.  Otherwise, no specific palliative recommendations.  Questions and concerns addressed.   PMT will continue to support holistically.  Time in: 1700 Time out: 1800 Total time: 60 minutes  Greater than 50%  of this time was spent counseling and coordinating care related to the above assessment and plan.  Micheline Rough, MD [place] [facility] [phone]

## 2020-03-03 DIAGNOSIS — K209 Esophagitis, unspecified without bleeding: Secondary | ICD-10-CM

## 2020-03-03 DIAGNOSIS — K2971 Gastritis, unspecified, with bleeding: Secondary | ICD-10-CM

## 2020-03-03 LAB — CBC
HCT: 33.6 % — ABNORMAL LOW (ref 39.0–52.0)
Hemoglobin: 11.4 g/dL — ABNORMAL LOW (ref 13.0–17.0)
MCH: 33.1 pg (ref 26.0–34.0)
MCHC: 33.9 g/dL (ref 30.0–36.0)
MCV: 97.7 fL (ref 80.0–100.0)
Platelets: 121 10*3/uL — ABNORMAL LOW (ref 150–400)
RBC: 3.44 MIL/uL — ABNORMAL LOW (ref 4.22–5.81)
RDW: 13.5 % (ref 11.5–15.5)
WBC: 6.2 10*3/uL (ref 4.0–10.5)
nRBC: 0 % (ref 0.0–0.2)

## 2020-03-03 LAB — COMPREHENSIVE METABOLIC PANEL
ALT: 47 U/L — ABNORMAL HIGH (ref 0–44)
AST: 22 U/L (ref 15–41)
Albumin: 1.8 g/dL — ABNORMAL LOW (ref 3.5–5.0)
Alkaline Phosphatase: 117 U/L (ref 38–126)
Anion gap: 8 (ref 5–15)
BUN: 65 mg/dL — ABNORMAL HIGH (ref 8–23)
CO2: 23 mmol/L (ref 22–32)
Calcium: 8.1 mg/dL — ABNORMAL LOW (ref 8.9–10.3)
Chloride: 111 mmol/L (ref 98–111)
Creatinine, Ser: 3.09 mg/dL — ABNORMAL HIGH (ref 0.61–1.24)
GFR calc Af Amer: 21 mL/min — ABNORMAL LOW (ref >60)
GFR calc non Af Amer: 18 mL/min — ABNORMAL LOW (ref >60)
Glucose, Bld: 100 mg/dL — ABNORMAL HIGH (ref 70–99)
Potassium: 3.9 mmol/L (ref 3.5–5.1)
Sodium: 142 mmol/L (ref 135–145)
Total Bilirubin: 0.4 mg/dL (ref 0.3–1.2)
Total Protein: 3.9 g/dL — ABNORMAL LOW (ref 6.5–8.1)

## 2020-03-03 LAB — ANTINUCLEAR ANTIBODIES, IFA: ANA Ab, IFA: NEGATIVE

## 2020-03-03 MED ORDER — FUROSEMIDE 40 MG PO TABS
40.0000 mg | ORAL_TABLET | Freq: Every day | ORAL | Status: DC
  Administered 2020-03-03 – 2020-03-05 (×3): 40 mg via ORAL
  Filled 2020-03-03 (×3): qty 1

## 2020-03-03 NOTE — Progress Notes (Addendum)
[facility] Gastroenterology Progress Note  Alvin Ingram 84 y.o. 03/05/1936  CC: Odynophagia   Subjective: Patient states he is feeling "about the same."  He is still having some discomfort when swallowing.  He denies any more episodes of emesis but has intermittent nausea.  He also endorses some mild heartburn and mild epigastric discomfort.  He denies having an appetite and states he has not been eating or drinking much.  Patient appears more comfortable during my exam today compared to yesterday.  He is not having any coughing and not spitting up any sputum today.  He was seen by ENT yesterday for persistent feeling of dysphagia despite negative EGD, as well as vocal hoarseness.  CT ordered per ENT request and was negative for any masses.  Laryngoscopy completed yesterday showed normal hypopharynx without evidence of mass or aspiration.  No further recommendations from ENT.  ROS : Review of Systems  Constitutional: Negative for chills and fever.  HENT: Positive for sore throat.        Vocal hoarseness  Gastrointestinal: Positive for abdominal pain (mild, epigastric), heartburn and nausea. Negative for vomiting.      Objective: Vital signs in last 24 hours: Vitals:   03/02/20 2209 03/03/20 0542  BP: (!) 101/58 135/78  Pulse: 65 69  Resp: 18 18  Temp: 97.6 F (36.4 C) (!) 97.4 F (36.3 C)  SpO2: 99% 99%   Physical Exam:  General:  Sleeping but arouses easily to voice, no distress, appears stated age  Head:  Normocephalic, without obvious abnormality, atraumatic  Eyes:   Lids and conjunctive are normal, EOM's intact  Lungs:   Clear to auscultation bilaterally, respirations unlabored  Heart:  Regular rate and rhythm, S1, S2 normal  Abdomen:   Soft, mild epigastric tenderness, bowel sounds active all four quadrants,  no masses  Extremities: Extremities normal, atraumatic, no  edema  Pulses: 2+ and symmetric    Lab Results: Recent Labs    03/02/20 0524 03/03/20 0535  NA 146*  142  K 4.5 3.9  CL 115* 111  CO2 24 23  GLUCOSE 109* 100*  BUN 73* 65*  CREATININE 3.03* 3.09*  CALCIUM 8.3* 8.1*   Recent Labs    03/02/20 0524 03/03/20 0535  AST 25 22  ALT 61* 47*  ALKPHOS 128* 117  BILITOT 0.5 0.4  PROT 4.2* 3.9*  ALBUMIN 2.0* 1.8*   Recent Labs    03/02/20 0524 03/03/20 0535  WBC 6.8 6.2  HGB 12.2* 11.4*  HCT 36.6* 33.6*  MCV 99.2 97.7  PLT 135* 121*   No results for input(s): LABPROT, INR in the last 72 hours.    Assessment Esophagitis, clinically improving.  Biopsies: benign squamous mucosa, no increase in intraepithelial eosinophils, no metaplasia, dysplasia, or malignancy.  Gastritis, improving.  Biopsies: mild reactive gastropathy, negative for H. Pylori, metaplasia, dysplasia, or malignancy.  Biopsy findings discussed with patient.  Vocal hoarseness, left vocal cord paresis per CT.  Laryngoscopy unremarkable. No further recommendations per ENT.  LFTs continue to trend down.  Pancreatic cyst per MRI 02/15/20, likely benign.  Plan: -Continue IV PPI while hospitalized with transition to PO at discharge -Continue Carafate -Advance diet as tolerated. Discussed importance of adequate PO intake with patient.  [facility] will sign off. Please contact us if we can be of any further assistance during this hospital stay.  Salley Slaughter PA-C 03/03/2020, 10:08 AM  Contact #  [phone]

## 2020-03-03 NOTE — Progress Notes (Signed)
PROGRESS NOTE    Alvin Ingram  EYC:[id number] DOB: [birth year] DOA: 02/22/2020 PCP: Binnie Rail, MD   Brief Narrative [name] a 84 y.o.malewith medical history significant forBPHfollowed by urology, hyperlipidemia and GERD and other comorbidities who presented to [facility] ED as advised by his niecewith concerns fordecreased p.o. intake, suspected uncontrolled depression, weight loss,and generalizedweakness. Evaluated by his PCP about 2 weeksprior. Labsshowing mildly elevated LFTs (AST of 40 and ALT of 59). On Elavil for depression which can cause elevation in LFTs. ACT of his abdomen in February (01/02/20)showed extensive sigmoid diverticulosis and a15 mm hypodense lesion of the distal body of the pancreas that was indeterminate. MRI abdomen (02/25/20) showeda cystic lesion of the pancreas, with radiology recommendation for survillance in 2 years.   My colleagues spoke to the patient's niece via phone and she is concerned that he might be depressed, recently lost a friend. Admitted to feeling depressed without suicidal or homicidal ideation. Seen by psych with recommendation to DC Elavil and start Zoloft. Appetite remained poor. Evaluated by PT OT with recommendation for SNF with 24-hour assistance/supervision. Receptive to going to SNF.    GI was consulted for Nausea, Vomiting and Weight loss.  Had EGD done 4/5 showing esophagitis and hemorrhagic gastritis.  Biopsies pending.  Also seen by ENT due to concern for hoarseness in the setting of significant weight loss in a former smoker.  No significant findings.  Today he is feeling okay and complains that he does not really have nausea but when I saw him he did not really eat his breakfast because he was afraid of being nauseous.  Ate a little bit he states.  GI recommending continuing PPI twice daily.  PT OT recommending skilled nursing facility.  Likely can be discharged to skilled nursing facility next 24 to 48 hours now    Assessment & Plan:   Principal Problem:   Failure to thrive in adult Active Problems:   AKI (acute kidney injury) ([place])   Transaminitis   Weakness   Hypernatremia   Protein-calorie malnutrition, severe  Failure to thrive in adult/ Severe weight loss/ Depression -Presented with poor oral intake, poor appetite,weight loss, uncontrolled depression -Elavil DCed and started on Zoloft 02/25/20 by Psychiatry -He had excessive sedation on Elavil and elevation in his LFTs -Encourage increase in oral protein calorie intake -Follow up with psych outpatient -See Below -Palliative care consulted and patient was not interested so of care recommending outpatient follow-up with palliative care at SNF  Esophagitis/Hemorrhagic gastritis -Management directed by GI as below -Continue IV PPI twice daily and Carafate and transition to po   Intractable nausea with transient vomiting, improved  -Continue antiemetics PRN -Continue recommendations per GI. -GI recommended ENT evaluation for chronic hoarseness and dysphagia with weight loss nausea and vomiting and patient has had chronic hoarseness with mild cough without symptoms of aspiration reflux laryngoscope was performed and showed normal bilateral vocal cord mobility without evidence of vocal cord nodule mass or hypopharynx -ENT believes the patient may have some vocal cord atrophy related to age changes and feels the swallowing issues may be related to chronic nausea and vomiting and GI issues and feels that there is no mucous lesion upper hypopharynx or larynx -Continue to monitor and continue with antiemetics as necessary  AKI on CKD 3B -Presented with creatinine 3.24, proteuniuria -Baseline creatinine 2 with GFR 38 -BUN/creatinine remains relatively stable since admission and is now 65/3.09 -Continue to avoid nephrotoxins, hypotension, contrast dyes and renally adjust medications -Continue to monitor  urine output -Follow recommendations  per nephrology; nephrology feels that he may have some nephrotic syndrome and have sent off serologies for prognostic purposes but patient not a good candidate for immunosuppression given his age and felt in condition at this time and they are rescheduling his outpatient office visit for 3 to 4 weeks and then recommending p.o. Lasix daily for 7 to 10 days and then stopping -Repeat CMP within the a.m.  Acute Transaminitisin the setting of Elavil, improving -Possibly iatrogenic, Elavil can increase LFTs -LFTs are trending down and improving as AST is now 22 and ALT is 47 -Right upper quadrant abdominal ultrasound 02/22/20 negative. No gallstones or wall thickening visualized. No sonographic Murphy sign noted by sonographer. -Acute hepatitis panel negative -Continue to avoid hepatotoxic agents  15 mm low attenuating lesion with fluid attenuation along the posterior distal body of the pancreas/B/L renal cysts -MRI shows cystic lesion of the pancreas, -Recommend surveillance withrepeat imagings in 2 years.  Resolved Hypovolemic hypernatremia with iV fluid -Presented with serum sodium 153 --> 151 --> 148 --> 147 --> 145 --> 146 --> 142  -Improved on D5% at 50 cc/hr but will stop and will start Lasix po 40 mg Daily  -Encourage free fluid oral intake -Continue to Monitor volume status but will stop IVF   Chronic Diastolic CHF -Last 2D echo done on 12/17/2019 showed LVEF 65-70% With grade 1 diastolic dysfunction -Hold off lasix due to recent dehydration and AKI -Was getting IVF Hydration at 50 mL/hr with D5W but will stop and start Lasix 40 mg po Daily for 7 Days  -Continue strict I's and O's and daily weight -Continue to monitor volume status extremely carefully  R pleural effusion on renal US -Incidental finding -Monitor volume status on IV fluid and will stop IV fluid hydration and start po Lasix 40 mg Daily -Continue incentive spirometer -Maintain O2 sat>92% -Repeat chest x-ray in  a.m.  GERD -ContinuingIV PPI as recommended by GI and will change to po at D/C as GI recommends pantoprazole 40 mg p.o. twice daily for 4 weeks followed by 40 mg p.o. once daily for another 4 weeks and also recommending Carafate for 2 weeks -Post EGD 03/01/2020 by Dr. Regina Eck with findings as stated above -GI recommending following up with Dr. Driscilla Grammes her primary GI doctor made off in 6 to 8 weeks after discharge  BPH -Continue Tamsulosin 0.4 mg po daily after supper -Follow up with Urology in the outpatient setting   Ambulatory dysfunction with frequent falls -Has had 3 falls at home within the last 2 weeks per his niece -[name] in the bathroom hospital 3/29, denies hitting his head -Denies feeling dizzy prior to falling -Lives alone, has not been able to appropriately take care of himself per his niece. -Continue PT OT with assistance and fall precautions. -TOC assisting with SNF placement as patient is agreeable   Avascular necrosis of the left femoral neck without acute fracture or cortical collapse from CT abd/pelvis wo contrast 01/02/20. -Fall Precautions -Follow up as an outpatient and continue PT/OT efforts  Normocytic Anemia -Patient's hemoglobin/hematocit is now 11.4/33.6  -Continue to monitor for signs and symptoms of bleeding; currently no overt bleeding noted -Repeat CBC in a.m.  DVT prophylaxis: Heparin 5,000 units sq q8h  Code Status: FULL CODE  Family Communication: No family present at bedside Disposition Plan: Patient is from home and now requires SNF level of care given his significant weakness.  GI is now signed off the case given that his symptomology  is improved somewhat.  We will need close monitoring for his oral intake and can be discharged in the next 24 to 48 hours and follow-up with nephrology, palliative care, psychiatry as well as ENT in the outpatient setting.   Consultants:   Gastroenterology  Palliative Care Medicine  ENT  Nephrology    Psychiatry    Procedures:  EGD 4/5 Findings:      The Z-line was regular and was found 42 cm from the incisors.      LA Grade C (one or more mucosal breaks continuous between tops of 2 or       more mucosal folds, less than 75% circumference) esophagitis with no       bleeding was found 40 cm from the incisors. Biopsies were taken with a       cold forceps for histology.      Scattered severe inflammation with hemorrhage characterized by       congestion (edema), erosions, erythema and friability was found in the       gastric body and in the gastric antrum. Biopsies were taken with a cold       forceps for histology.      The cardia and gastric fundus were normal on retroflexion.      Extrinsic compression on the stomach was found in the gastric antrum.      The duodenal bulb, first portion of the duodenum and second portion of       the duodenum were normal. Impression:               - Z-line regular, 42 cm from the incisors.                           - LA Grade C esophagitis with no bleeding. Biopsied.                           - Gastritis with hemorrhage. Biopsied.                           - Extrinsic compression in the gastric antrum.                           - Normal duodenal bulb, first portion of the                            duodenum and second portion of the duodenum.   Antimicrobials:  Anti-infectives (From admission, onward)   None     Subjective: Seen and examined and he was sitting in the chair at bedside and wanted to get back into the bed.  States that he has been eating okay and did not eat very much because he is afraid to be nauseous.  No lightheadedness or dizziness.  Apparently he ate more before GI saw him.  No chest pain or any other concerns or complaints at this time.  Objective: Vitals:   03/02/20 1318 03/02/20 2209 03/03/20 0542 03/03/20 1346  BP: 129/77 (!) 101/58 135/78 129/82  Pulse: 65 65 69 70  Resp: 18 18 18 16   Temp: (!) 97.5 F (36.4 C)  97.6 F (36.4 C) (!) 97.4 F (36.3 C) 97.6 F (36.4 C)  TempSrc: Oral Oral Oral Oral  SpO2: 99% 99% 99% 100%  Weight:  Height:        Intake/Output Summary (Last 24 hours) at 03/03/2020 1835 Last data filed at 03/03/2020 1541 Gross per 24 hour  Intake 200 ml  Output 300 ml  Net -100 ml   Filed Weights   03/01/20 0547 03/01/20 1204 03/02/20 0500  Weight: 66.2 kg 66.2 kg 66 kg   Examination: Physical Exam:  Constitutional: Thin elderly African-American male in, NAD and appears calm but a little uncomfortable sitting in the chair bedside Eyes: Lids and conjunctivae normal, sclerae anicteric  ENMT: External Ears, Nose appear normal. Grossly normal hearing. Neck: Appears normal, supple, no cervical masses, normal ROM, no appreciable thyromegaly; no JVD Respiratory: Clear to auscultation bilaterally, no wheezing, rales, rhonchi or crackles. Normal respiratory effort and patient is not tachypenic. No accessory muscle use.  Cardiovascular: RRR, no murmurs / rubs / gallops. S1 and S2 auscultate diminished.  Trace extremity edema.  Abdomen: Soft, non-tender, non-distended. Bowel sounds positive.  GU: Deferred. Musculoskeletal: No clubbing / cyanosis of digits/nails. No joint deformity upper and lower extremities.  Skin: No rashes, lesions, ulcers on limited skin evaluation. No induration; Warm and dry.  Neurologic: CN 2-12 grossly intact with no focal deficits. Romberg sign and cerebellar reflexes not assessed.  Psychiatric: Normal judgment and insight. Alert and oriented x 3. Normal mood and appropriate affect.   Data Reviewed: I have personally reviewed following labs and imaging studies  CBC: Recent Labs  Lab 02/29/20 0519 03/01/20 0602 03/02/20 0524 03/03/20 0535  WBC 5.9 5.7 6.8 6.2  NEUTROABS 4.0  --   --   --   HGB 11.0* 11.0* 12.2* 11.4*  HCT 33.0* 33.5* 36.6* 33.6*  MCV 98.8 99.4 99.2 97.7  PLT 131* 123* 135* 101*   Basic Metabolic Panel: Recent Labs  Lab  02/27/20 0502 02/29/20 0519 03/01/20 0602 03/02/20 0524 03/03/20 0535  NA 149* 147* 145 146* 142  K 4.3 4.2 4.0 4.5 3.9  CL 118* 117* 117* 115* 111  CO2 24 23 22 24 23   GLUCOSE 101* 121* 111* 109* 100*  BUN 78* 76* 70* 73* 65*  CREATININE 2.91* 3.24* 3.21* 3.03* 3.09*  CALCIUM 8.3* 8.1* 8.1* 8.3* 8.1*   GFR: Estimated Creatinine Clearance: 17.5 mL/min (A) (by C-G formula based on SCr of 3.09 mg/dL (H)). Liver Function Tests: Recent Labs  Lab 02/28/20 1153 02/29/20 0519 03/02/20 0524 03/03/20 0535  AST 56* 31 25 22   ALT 116* 84* 61* 47*  ALKPHOS 145* 133* 128* 117  BILITOT 0.7 0.5 0.5 0.4  PROT 4.3* 4.0* 4.2* 3.9*  ALBUMIN 2.0* 1.8* 2.0* 1.8*   Recent Labs  Lab 02/28/20 1156  LIPASE 19   No results for input(s): AMMONIA in the last 168 hours. Coagulation Profile: No results for input(s): INR, PROTIME in the last 168 hours. Cardiac Enzymes: No results for input(s): CKTOTAL, CKMB, CKMBINDEX, TROPONINI in the last 168 hours. BNP (last 3 results) Recent Labs    12/29/19 1639 02/13/20 1431  PROBNP 224.0* 84.0   HbA1C: No results for input(s): HGBA1C in the last 72 hours. CBG: No results for input(s): GLUCAP in the last 168 hours. Lipid Profile: No results for input(s): CHOL, HDL, LDLCALC, TRIG, CHOLHDL, LDLDIRECT in the last 72 hours. Thyroid Function Tests: No results for input(s): TSH, T4TOTAL, FREET4, T3FREE, THYROIDAB in the last 72 hours. Anemia Panel: No results for input(s): VITAMINB12, FOLATE, FERRITIN, TIBC, IRON, RETICCTPCT in the last 72 hours. Sepsis Labs: No results for input(s): PROCALCITON, LATICACIDVEN in the last 168 hours.  Recent Results (from the past 240 hour(s))  SARS CORONAVIRUS 2 (TAT 6-24 HRS) Nasopharyngeal Nasopharyngeal Swab     Status: None   Collection Time: 02/27/20  6:32 PM   Specimen: Nasopharyngeal Swab  Result Value Ref Range Status   SARS Coronavirus 2 NEGATIVE NEGATIVE Final    Comment: (NOTE) SARS-CoV-2 target nucleic  acids are NOT DETECTED. The SARS-CoV-2 RNA is generally detectable in upper and lower respiratory specimens during the acute phase of infection. Negative results do not preclude SARS-CoV-2 infection, do not rule out co-infections with other pathogens, and should not be used as the sole basis for treatment or other patient management decisions. Negative results must be combined with clinical observations, patient history, and epidemiological information. The expected result is Negative. Fact Sheet for Patients: SugarRoll.be Fact Sheet for Healthcare Providers: https://www.woods-mathews.com/ This test is not yet approved or cleared by the [place] FDA and  has been authorized for detection and/or diagnosis of SARS-CoV-2 by FDA under an Emergency Use Authorization (EUA). This EUA will remain  in effect (meaning this test can be used) for the duration of the COVID-19 declaration under Section 56 4(b)(1) of the Act, 21 U.S.C. section 360bbb-3(b)(1), unless the authorization is terminated or revoked sooner. Performed at [facility], [place] [address], [place],  [address]      RN Pressure Injury Documentation:     Estimated body mass index is 19.2 kg/m as calculated from the following:   Height as of this encounter: 6\' 1"  (1.854 m).   Weight as of this encounter: 66 kg.  Malnutrition Type:  Nutrition Problem: Severe Malnutrition Etiology: acute illness(unknown etiology)   Malnutrition Characteristics:  Signs/Symptoms: moderate fat depletion, moderate muscle depletion, severe muscle depletion, percent weight loss Percent weight loss: 20 %   Nutrition Interventions:  Interventions: Ensure Enlive (each supplement provides 350kcal and 20 grams of protein), Magic cup, MVI   Radiology Studies: CT SOFT TISSUE NECK WO CONTRAST  Result Date: 03/02/2020 CLINICAL DATA:  Dysphagia. EGD with biopsy yesterday. Esophagitis and  hemorrhagic gastritis. Nausea vomiting and weight loss. EXAM: CT NECK WITHOUT CONTRAST TECHNIQUE: Multidetector CT imaging of the neck was performed following the standard protocol without intravenous contrast. COMPARISON:  None. FINDINGS: Pharynx and larynx: Asymmetric enlargement of the left laryngeal ventricle. Thinning of the left vocal cord. Mild thickening of the left aryepiglottic fold. 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 Correlate with hoarseness. No mass lesion identified. Epiglottis and pharynx otherwise normal. Salivary glands: No inflammation, mass, or stone. Thyroid: Negative Lymph nodes: No enlarged lymph nodes in the neck. Vascular: Limited vascular evaluation without intravenous contrast. Limited intracranial: Negative Visualized orbits: Bilateral cataract extraction.  No orbital mass. Mastoids and visualized paranasal sinuses: Mild mucosal edema paranasal sinuses. Air-fluid level left sphenoid sinus. Bony thickening left sphenoid sinus. Mastoid clear bilaterally. Skeleton: Cervical spondylosis with disc and facet degeneration at multiple levels. No acute skeletal abnormality. Upper chest: Visualized lung apices clear bilaterally. Mild apical scarring bilaterally. 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 Correlate with hoarseness. Electronically Signed   By: Franchot Gallo M.D.   On: 03/02/2020 15:49   Scheduled Meds: . aspirin EC  81 mg Oral Daily  . feeding supplement  1 Container Oral TID BM  . feeding supplement (ENSURE ENLIVE)  237 mL Oral BID BM  . heparin  5,000 Units Subcutaneous Q8H  . multivitamin with minerals  1 tablet Oral Daily  . pantoprazole (  PROTONIX) IV  40 mg Intravenous Q12H  . polyethylene glycol  17 g Oral BID  . senna-docusate  2 tablet Oral BID  . sertraline  25 mg Oral Daily  . sucralfate  1 g Oral TID WC & HS  . tamsulosin  0.4 mg Oral QPC supper   Continuous Infusions: .  dextrose 50 mL/hr at 03/02/20 0500    LOS: 8 days   Kerney Elbe, DO [facility] PAGER is on AMION  If 7PM-7AM, please contact night-coverage [email or web address]

## 2020-03-03 NOTE — Progress Notes (Signed)
Occupational Therapy Treatment Patient Details Name: Alvin Ingram MRN: [id number] DOB: Apr 11, 1936 Today's Date: 03/03/2020    History of present illness Alvin Ingram is a 84 y.o. male with medical history significant for BPH, hyperlipidemia and GERD who presents with concerns of decreased p.o. intake and weakness.   OT comments  Pt agreeable to OOB with OT after [facility] encouragement. Pt initially refused  Follow Up Recommendations  SNF          Precautions / Restrictions Precautions Precautions: Fall Restrictions Weight Bearing Restrictions: No       Mobility Bed Mobility Overal bed mobility: Needs Assistance Bed Mobility: Supine to Sit     Supine to sit: Mod assist        Transfers Overall transfer level: Needs assistance Equipment used: 1 person hand held assist Transfers: Sit to/from Stand;Stand Pivot Transfers Sit to Stand: Min assist;From elevated surface Stand pivot transfers: Min assist;Mod assist;From elevated surface            Balance Overall balance assessment: Needs assistance   Sitting balance-Leahy Scale: Good       Standing balance-Leahy Scale: Fair                             ADL either performed or assessed with clinical judgement   ADL Overall ADL's : Needs assistance/impaired Eating/Feeding: Set up;Sitting   Grooming: Supervision/safety;Sitting                   Toilet Transfer: Minimal assistance;Ambulation;RW;Comfort height toilet Toilet Transfer Details (indicate cue type and reason): bed to chair Toileting- Clothing Manipulation and Hygiene: Sit to/from stand;Minimal assistance       Functional mobility during ADLs: Minimal assistance;Rolling walker General ADL Comments: Max encouragement to get OOB and order breakfast.  OT did bring pt fresh coffee and pt perked  up.     Vision Patient Visual Report: No change from baseline            Cognition Arousal/Alertness: Awake/alert Behavior During Therapy:  WFL for tasks assessed/performed Overall Cognitive Status: Within Functional Limits for tasks assessed                                                     Pertinent Vitals/ Pain       Pain Assessment: No/denies pain         Frequency  Min 2X/week        Progress Toward Goals  OT Goals(current goals can now be found in the care plan section)  Progress towards OT goals: Progressing toward goals     Plan Discharge plan needs to be updated       AM-PAC OT "6 Clicks" Daily Activity     Outcome Measure   Help from another person eating meals?: None Help from another person taking care of personal grooming?: A Little Help from another person toileting, which includes using toliet, bedpan, or urinal?: A Little Help from another person bathing (including washing, rinsing, drying)?: A Little Help from another person to put on and taking off regular upper body clothing?: A Little Help from another person to put on and taking off regular lower body clothing?: A Little 6 Click Score: 19    End of Session Equipment Utilized During Treatment: Rolling walker  OT Visit  Diagnosis: Muscle weakness (generalized) (M62.81);Adult, failure to thrive (R62.7)   Activity Tolerance Patient tolerated treatment well   Patient Left in chair;with call bell/phone within reach;with chair alarm set   Nurse Communication Mobility status        Time: 2952-8413 OT Time Calculation (min): 18 min  Charges: OT General Charges $OT Visit: 1 Visit OT Treatments $Self Care/Home Management : 8-22 mins  Kari Baars, [place] [phone] Office- [phone]      Symone Cornman, Edwena Felty D 03/03/2020, 11:56 AM

## 2020-03-04 ENCOUNTER — Inpatient Hospital Stay (HOSPITAL_COMMUNITY): Payer: Medicare HMO

## 2020-03-04 DIAGNOSIS — F329 Major depressive disorder, single episode, unspecified: Secondary | ICD-10-CM

## 2020-03-04 LAB — COMPREHENSIVE METABOLIC PANEL
ALT: 41 U/L (ref 0–44)
AST: 21 U/L (ref 15–41)
Albumin: 1.9 g/dL — ABNORMAL LOW (ref 3.5–5.0)
Alkaline Phosphatase: 124 U/L (ref 38–126)
Anion gap: 9 (ref 5–15)
BUN: 66 mg/dL — ABNORMAL HIGH (ref 8–23)
CO2: 24 mmol/L (ref 22–32)
Calcium: 8.2 mg/dL — ABNORMAL LOW (ref 8.9–10.3)
Chloride: 108 mmol/L (ref 98–111)
Creatinine, Ser: 3.13 mg/dL — ABNORMAL HIGH (ref 0.61–1.24)
GFR calc Af Amer: 21 mL/min — ABNORMAL LOW (ref >60)
GFR calc non Af Amer: 18 mL/min — ABNORMAL LOW (ref >60)
Glucose, Bld: 96 mg/dL (ref 70–99)
Potassium: 4.2 mmol/L (ref 3.5–5.1)
Sodium: 141 mmol/L (ref 135–145)
Total Bilirubin: 0.8 mg/dL (ref 0.3–1.2)
Total Protein: 4.1 g/dL — ABNORMAL LOW (ref 6.5–8.1)

## 2020-03-04 LAB — CBC WITH DIFFERENTIAL/PLATELET
Abs Immature Granulocytes: 0.02 10*3/uL (ref 0.00–0.07)
Basophils Absolute: 0 10*3/uL (ref 0.0–0.1)
Basophils Relative: 0 %
Eosinophils Absolute: 0 10*3/uL (ref 0.0–0.5)
Eosinophils Relative: 1 %
HCT: 37.5 % — ABNORMAL LOW (ref 39.0–52.0)
Hemoglobin: 12.3 g/dL — ABNORMAL LOW (ref 13.0–17.0)
Immature Granulocytes: 0 %
Lymphocytes Relative: 21 %
Lymphs Abs: 1.2 10*3/uL (ref 0.7–4.0)
MCH: 32.4 pg (ref 26.0–34.0)
MCHC: 32.8 g/dL (ref 30.0–36.0)
MCV: 98.7 fL (ref 80.0–100.0)
Monocytes Absolute: 0.5 10*3/uL (ref 0.1–1.0)
Monocytes Relative: 8 %
Neutro Abs: 4.2 10*3/uL (ref 1.7–7.7)
Neutrophils Relative %: 70 %
Platelets: 129 10*3/uL — ABNORMAL LOW (ref 150–400)
RBC: 3.8 MIL/uL — ABNORMAL LOW (ref 4.22–5.81)
RDW: 13.5 % (ref 11.5–15.5)
WBC: 6 10*3/uL (ref 4.0–10.5)
nRBC: 0 % (ref 0.0–0.2)

## 2020-03-04 LAB — MAGNESIUM: Magnesium: 2.5 mg/dL — ABNORMAL HIGH (ref 1.7–2.4)

## 2020-03-04 LAB — PHOSPHORUS: Phosphorus: 4.7 mg/dL — ABNORMAL HIGH (ref 2.5–4.6)

## 2020-03-04 MED ORDER — PANTOPRAZOLE SODIUM 40 MG PO TBEC
40.0000 mg | DELAYED_RELEASE_TABLET | Freq: Two times a day (BID) | ORAL | Status: DC
  Administered 2020-03-04 – 2020-03-18 (×26): 40 mg via ORAL
  Filled 2020-03-04 (×26): qty 1

## 2020-03-04 NOTE — Care Management Important Message (Signed)
Important Message  Patient Details IM Letter given to Marney Doctor RN Case Manager to present to the Patient Name: Alvin Ingram MRN: [id number] Date of Birth: 1936-09-06   Medicare Important Message Given:  Yes     [name] 03/04/2020, 10:02 AM

## 2020-03-04 NOTE — Progress Notes (Signed)
PT Cancellation Note  Patient Details Name: Alvin Ingram MRN: [id number] DOB: 1936/04/24   Cancelled Treatment:    Reason Eval/Treat Not Completed: Fatigue/lethargy limiting ability to participate;Patient declined, no reason specified(pt declined this date stating he does not feel up to it despite repeated encouragement and education on benefits of mobility.) Will follow up at later date/time as schedule allows.  Verner Mould, DPT Physical Therapist with [facility] [phone]  03/04/2020 2:53 PM

## 2020-03-04 NOTE — TOC Progression Note (Addendum)
Transition of Care [facility]) - Progression Note    Patient Details  Name: Alvin Ingram MRN: [id number] Date of Birth: 02-16-1936  [facility] CM/SW Contact  Emmer Lillibridge, Marjie Skiff, RN Phone Number: 03/04/2020, 3:32 PM  Clinical Narrative:    Bed at [place] [facility] continues to be available for patient. SNF needs new Holli Humbles as previous auth expired. Auth cannot be done without current PT notes. Patient continues to refuse to work with PT. This CM has spoken with patient to encourage participation with PT. This will be a barrier to dc if patient continues to refuse PT.  Expected Discharge Plan: Skilled Nursing Facility Barriers to Discharge: Continued Medical Work up  Expected Discharge Plan and Services Expected Discharge Plan: [place]   Discharge Planning Services: CM Consult   Living arrangements for the past 2 months: Single Family Home Expected Discharge Date: 02/28/20

## 2020-03-04 NOTE — Progress Notes (Signed)
PROGRESS NOTE    Alvin Ingram  GLO:[id number] DOB: [birth year] DOA: 02/22/2020 PCP: Binnie Rail, MD   Brief Narrative Alvin Ingram a 84 y.o.malewith medical history significant forBPHfollowed by urology, hyperlipidemia and GERD and other comorbidities who presented to [facility] ED as advised by his niecewith concerns fordecreased p.o. intake, suspected uncontrolled depression, weight loss,and generalizedweakness. Evaluated by his PCP about 2 weeksprior. Labsshowing mildly elevated LFTs (AST of 40 and ALT of 59). On Elavil for depression which can cause elevation in LFTs. ACT of his abdomen in February (01/02/20)showed extensive sigmoid diverticulosis and a15 mm hypodense lesion of the distal body of the pancreas that was indeterminate. MRI abdomen (02/25/20) showeda cystic lesion of the pancreas, with radiology recommendation for survillance in 2 years.   My colleagues spoke to the patient's niece via phone and she is concerned that he might be depressed, recently lost a friend. Admitted to feeling depressed without suicidal or homicidal ideation. Seen by psych with recommendation to DC Elavil and start Zoloft. Appetite remained poor. Evaluated by PT OT with recommendation for SNF with 24-hour assistance/supervision. Receptive to going to SNF.    GI was consulted for Nausea, Vomiting and Weight loss.  Had EGD done 4/5 showing esophagitis and hemorrhagic gastritis.  Biopsies pending.  Also seen by ENT due to concern for hoarseness in the setting of significant weight loss in a former smoker.  No significant findings.  Yesterday he is feeling okay and complains that he does not really have nausea but when I saw him he did not really eat his breakfast because he was afraid of being nauseous.  Ate a little bit he states.  GI recommending continuing PPI twice daily.  PT OT recommending skilled nursing facility.  Unfortunately patient cannot be discharged today given that his SNF  authorization has expired and they are requesting updated PT and OT notes however patient been refusing to work with PT OT due to his fatigue.  This could potentially be a barrier to discharge given that he is improved from a GI standpoint and they have signed off.   Assessment & Plan:   Principal Problem:   Failure to thrive in adult Active Problems:   AKI (acute kidney injury) ([place])   Transaminitis   Weakness   Hypernatremia   Protein-calorie malnutrition, severe  Failure to thrive in adult/ Severe weight loss/ Depression -Presented with poor oral intake, poor appetite,weight loss, uncontrolled depression -Elavil DCed and started on Zoloft 02/25/20 by Psychiatry and will continue -He had excessive sedation on Elavil and elevation in his LFTs -Encourage increase in oral protein calorie intake -Follow up with psych outpatient -See Below -Palliative care consulted and patient was not interested so of care recommending outpatient follow-up with palliative care at SNF -PT OT recommending SNF however he has not been receiving to work with them and this could be partly due to his depression did appear a little withdrawn today  Esophagitis/Hemorrhagic gastritis -Management directed by GI as below -Continued IV PPI twice daily and Carafate and transitioned to po   Intractable nausea with transient vomiting, improved  -Continue antiemetics PRN -Continue recommendations per GI. -GI recommended ENT evaluation for chronic hoarseness and dysphagia with weight loss nausea and vomiting and patient has had chronic hoarseness with mild cough without symptoms of aspiration reflux laryngoscope was performed and showed normal bilateral vocal cord mobility without evidence of vocal cord nodule mass or hypopharynx -ENT believes the patient may have some vocal cord atrophy related to age changes and  feels the swallowing issues may be related to chronic nausea and vomiting and GI issues and feels that  there is no mucous lesion upper hypopharynx or larynx -Continue to monitor and continue with antiemetics as necessary -Patient is no longer nauseous and will continue to encourage eating  AKI on CKD 3B -Presented with creatinine 3.24, proteuniuria -Baseline creatinine 2 with GFR 38 -BUN/creatinine remains relatively stable since admission and is now relatively stable from last few instances at 66/3.13 -Continue to avoid nephrotoxins, hypotension, contrast dyes and renally adjust medications -Continue to monitor urine output -Follow recommendations per nephrology; nephrology feels that he may have some nephrotic syndrome and have sent off serologies for prognostic purposes but patient not a good candidate for immunosuppression given his age and felt in condition at this time and they are rescheduling his outpatient office visit for 3 to 4 weeks and then recommending p.o. Lasix daily for 7 to 10 days and then stopping; today is day 2 of his Lasix -Repeat CMP within the a.m.  Acute Transaminitisin the setting of Elavil, improving and resolved -Possibly iatrogenic, Elavil can increase LFTs -LFTs are trending down and have improved as AST is 21 and ALT is 41 -Right upper quadrant abdominal ultrasound 02/22/20 negative. No gallstones or wall thickening visualized. No sonographic Murphy sign noted by sonographer. -Acute hepatitis panel negative -Continue to avoid hepatotoxic agents  15 mm low attenuating lesion with fluid attenuation along the posterior distal body of the pancreas/B/L renal cysts -MRI shows cystic lesion of the pancreas, -Recommend surveillance withrepeat imagings in 2 years.  Resolved Hypovolemic hypernatremia with iV fluid -Presented with serum sodium 153 --> 151 --> 148 --> 147 --> 145 --> 146 --> 142 --> 141 -Improved on D5% at 50 cc/hr but will stop and will start Lasix po 40 mg Daily as above intubated day 2 -Encourage free fluid oral intake -Continue to Monitor  volume status but will stop IVF   Chronic Diastolic CHF -Last 2D echo done on 12/17/2019 showed LVEF 65-70% With grade 1 diastolic dysfunction -Initially held off lasix due to recent dehydration and AKI -Was getting IVF Hydration at 50 mL/hr with D5W but will stop and start Lasix 40 mg po Daily for 7 Days at the recommendation of nephrology -Continue strict I's and O's and daily weight -Continue to monitor volume status extremely carefully  R pleural effusion on renal US proved and resolved -Incidental finding -Monitor volume status on IV fluid and will stop IV fluid hydration and start po Lasix 40 mg Daily -Continue incentive spirometer -Maintain O2 sat>92% -Repeat chest x-ray showed "Cardiac shadow is within normal limits. Aortic calcifications are again seen. The lungs are hyperinflated. No focal infiltrate or sizable effusion is seen. No bony abnormality is noted." -Tinea with Lasix as above  GERD -ContinuingIV PPI as recommended by GI and will change to po today as GI recommends pantoprazole 40 mg p.o. twice daily for 4 weeks followed by 40 mg p.o. once daily for another 4 weeks and also recommending Carafate for 2 weeks -Post EGD 03/01/2020 by Dr. Regina Eck with findings as stated above -GI recommending following up with Dr. Watt Climes or Primary GI doctor made off in 6 to 8 weeks after discharge  BPH -Continue Tamsulosin 0.4 mg po daily after supper -Follow up with Urology in the outpatient setting   Ambulatory dysfunction with frequent falls -Has had 3 falls at home within the last 2 weeks per his niece [name] in the bathroom hospital 3/29, denies hitting his head -  Denies feeling dizzy prior to falling -Lives alone, has not been able to appropriately take care of himself per his niece. -Continue PT OT with assistance and fall precautions. -TOC assisting with SNF placement as patient is agreeable however he is now recently refusing PT OT evaluation to SNF authorization is expired  and they request updated PT notes  Avascular necrosis of the left femoral neck without acute fracture or cortical collapse from CT abd/pelvis wo contrast 01/02/20. -Fall Precautions -Follow up as an outpatient and continue PT/OT efforts  Normocytic Anemia -Patient's hemoglobin/hematocit is now 12.3/37.5 -Continue to monitor for signs and symptoms of bleeding; currently no overt bleeding noted -Repeat CBC in a.m.  Thrombocytopenia -Mild but appears relatively stable for last few times it is checked and is 129,000 today -Continue to monitor for signs and symptoms of bleeding; -Currently no overt bleeding noted -Repeat CBC in a.m.  DVT prophylaxis: Heparin 5,000 units sq q8h  Code Status: FULL CODE  Family Communication: No family present at bedside Disposition Plan: Patient is from home and now requires SNF level of care given his significant weakness and deconditioning.  GI is now signed off the case given that his symptomology is improved somewhat.  We will need close monitoring for his oral intake and can be discharged in the next 24 to 48 hours and follow-up with nephrology, palliative care, psychiatry as well as ENT in the outpatient setting.  A barrier to his discharge however is that his SNF authorization is expired and that he has been refusing to work with PT and OT recently suspect due to his depression and without updated PT notes he cannot safely be discharged to skilled nursing facility as he cannot be accepted despite him having a bed there.  Consultants:   Gastroenterology  Palliative Care Medicine  ENT  Nephrology   Psychiatry    Procedures:  EGD 4/5 Findings:      The Z-line was regular and was found 42 cm from the incisors.      LA Grade C (one or more mucosal breaks continuous between tops of 2 or       more mucosal folds, less than 75% circumference) esophagitis with no       bleeding was found 40 cm from the incisors. Biopsies were taken with a       cold  forceps for histology.      Scattered severe inflammation with hemorrhage characterized by       congestion (edema), erosions, erythema and friability was found in the       gastric body and in the gastric antrum. Biopsies were taken with a cold       forceps for histology.      The cardia and gastric fundus were normal on retroflexion.      Extrinsic compression on the stomach was found in the gastric antrum.      The duodenal bulb, first portion of the duodenum and second portion of       the duodenum were normal. Impression:               - Z-line regular, 42 cm from the incisors.                           - LA Grade C esophagitis with no bleeding. Biopsied.                           -  Gastritis with hemorrhage. Biopsied.                           - Extrinsic compression in the gastric antrum.                           - Normal duodenal bulb, first portion of the                            duodenum and second portion of the duodenum.   Antimicrobials:  Anti-infectives (From admission, onward)   None     Subjective: Seen and examined and he lying in bed and he is complaining that it is "noisy".  States that his appetite is okay but wanted to rest and sleep as he did not sleep very well last night.  Denies any chest pain, lightheadedness or dizziness.  No other concerns or complaints at this time but he wanted to be left alone.  Objective: Vitals:   03/03/20 1346 03/03/20 2103 03/04/20 0544 03/04/20 1400  BP: 129/82 129/77 117/78 139/88  Pulse: 70 65 73 100  Resp: 16 16 18 18   Temp: 97.6 F (36.4 C) 97.8 F (36.6 C) 97.8 F (36.6 C) 97.7 F (36.5 C)  TempSrc: Oral Oral Oral Oral  SpO2: 100% 99% 99% 97%  Weight:      Height:        Intake/Output Summary (Last 24 hours) at 03/04/2020 1828 Last data filed at 03/03/2020 1859 Gross per 24 hour  Intake 120 ml  Output --  Net 120 ml   Filed Weights   03/01/20 0547 03/01/20 1204 03/02/20 0500  Weight: 66.2 kg 66.2 kg 66 kg    Examination: Physical Exam:  Constitutional: Thin elderly African-American male currently in no acute distress appears depressed and wanting to sleep and not really be bothered Eyes:  Lids and conjunctivae normal, sclerae anicteric  ENMT: External Ears, Nose appear normal. Grossly normal hearing.  Neck: Appears normal, supple, no cervical masses, normal ROM, no appreciable thyromegaly; no JVD Respiratory: Diminished to auscultation bilaterally, no wheezing, rales, rhonchi or crackles. Normal respiratory effort and patient is not tachypenic. No accessory muscle use.  Unlabored breathing is not wearing any supplemental oxygen via nasal cannula Cardiovascular: RRR, no murmurs / rubs / gallops. S1 and S2 auscultated.  Trace extremity edema.  Abdomen: Soft, non-tender, non-distended. Bowel sounds positive.  GU: Deferred. Musculoskeletal: No clubbing / cyanosis of digits/nails. No joint deformity upper and lower extremities. Skin: No rashes, lesions, ulcers limited skin evaluation. No induration; Warm and dry.  Neurologic: CN 2-12 grossly intact with no focal deficits. Romberg sign and cerebellar reflexes not assessed.  Psychiatric: Normal judgment and insight. Alert and oriented x 3.  Depressed appearing mood and flat affect.   Data Reviewed: I have personally reviewed following labs and imaging studies  CBC: Recent Labs  Lab 02/29/20 0519 03/01/20 0602 03/02/20 0524 03/03/20 0535 03/04/20 0538  WBC 5.9 5.7 6.8 6.2 6.0  NEUTROABS 4.0  --   --   --  4.2  HGB 11.0* 11.0* 12.2* 11.4* 12.3*  HCT 33.0* 33.5* 36.6* 33.6* 37.5*  MCV 98.8 99.4 99.2 97.7 98.7  PLT 131* 123* 135* 121* 865*   Basic Metabolic Panel: Recent Labs  Lab 02/29/20 0519 03/01/20 0602 03/02/20 0524 03/03/20 0535 03/04/20 0538  NA 147* 145 146* 142 141  K 4.2 4.0  4.5 3.9 4.2  CL 117* 117* 115* 111 108  CO2 23 22 24 23 24   GLUCOSE 121* 111* 109* 100* 96  BUN 76* 70* 73* 65* 66*  CREATININE 3.24* 3.21* 3.03*  3.09* 3.13*  CALCIUM 8.1* 8.1* 8.3* 8.1* 8.2*  MG  --   --   --   --  2.5*  PHOS  --   --   --   --  4.7*   GFR: Estimated Creatinine Clearance: 17.3 mL/min (A) (by C-G formula based on SCr of 3.13 mg/dL (H)). Liver Function Tests: Recent Labs  Lab 02/28/20 1153 02/29/20 0519 03/02/20 0524 03/03/20 0535 03/04/20 0538  AST 56* 31 25 22 21   ALT 116* 84* 61* 47* 41  ALKPHOS 145* 133* 128* 117 124  BILITOT 0.7 0.5 0.5 0.4 0.8  PROT 4.3* 4.0* 4.2* 3.9* 4.1*  ALBUMIN 2.0* 1.8* 2.0* 1.8* 1.9*   Recent Labs  Lab 02/28/20 1156  LIPASE 19   No results for input(s): AMMONIA in the last 168 hours. Coagulation Profile: No results for input(s): INR, PROTIME in the last 168 hours. Cardiac Enzymes: No results for input(s): CKTOTAL, CKMB, CKMBINDEX, TROPONINI in the last 168 hours. BNP (last 3 results) Recent Labs    12/29/19 1639 02/13/20 1431  PROBNP 224.0* 84.0   HbA1C: No results for input(s): HGBA1C in the last 72 hours. CBG: No results for input(s): GLUCAP in the last 168 hours. Lipid Profile: No results for input(s): CHOL, HDL, LDLCALC, TRIG, CHOLHDL, LDLDIRECT in the last 72 hours. Thyroid Function Tests: No results for input(s): TSH, T4TOTAL, FREET4, T3FREE, THYROIDAB in the last 72 hours. Anemia Panel: No results for input(s): VITAMINB12, FOLATE, FERRITIN, TIBC, IRON, RETICCTPCT in the last 72 hours. Sepsis Labs: No results for input(s): PROCALCITON, LATICACIDVEN in the last 168 hours.  Recent Results (from the past 240 hour(s))  SARS CORONAVIRUS 2 (TAT 6-24 HRS) Nasopharyngeal Nasopharyngeal Swab     Status: None   Collection Time: 02/27/20  6:32 PM   Specimen: Nasopharyngeal Swab  Result Value Ref Range Status   SARS Coronavirus 2 NEGATIVE NEGATIVE Final    Comment: (NOTE) SARS-CoV-2 target nucleic acids are NOT DETECTED. The SARS-CoV-2 RNA is generally detectable in upper and lower respiratory specimens during the acute phase of infection. Negative results do  not preclude SARS-CoV-2 infection, do not rule out co-infections with other pathogens, and should not be used as the sole basis for treatment or other patient management decisions. Negative results must be combined with clinical observations, patient history, and epidemiological information. The expected result is Negative. Fact Sheet for Patients: SugarRoll.be Fact Sheet for Healthcare Providers: https://www.woods-mathews.com/ This test is not yet approved or cleared by the [place] FDA and  has been authorized for detection and/or diagnosis of SARS-CoV-2 by FDA under an Emergency Use Authorization (EUA). This EUA will remain  in effect (meaning this test can be used) for the duration of the COVID-19 declaration under Section 56 4(b)(1) of the Act, 21 U.S.C. section 360bbb-3(b)(1), unless the authorization is terminated or revoked sooner. Performed at [facility], [place] [address], [place], [place] [address]      RN Pressure Injury Documentation:     Estimated body mass index is 19.2 kg/m as calculated from the following:   Height as of this encounter: 6\' 1"  (1.854 m).   Weight as of this encounter: 66 kg.  Malnutrition Type:  Nutrition Problem: Severe Malnutrition Etiology: acute illness(unknown etiology)   Malnutrition Characteristics:  Signs/Symptoms: moderate  fat depletion, moderate muscle depletion, severe muscle depletion, percent weight loss Percent weight loss: 20 %   Nutrition Interventions:  Interventions: Ensure Enlive (each supplement provides 350kcal and 20 grams of protein), Magic cup, MVI   Radiology Studies: DG CHEST PORT 1 VIEW  Result Date: 03/04/2020 CLINICAL DATA:  Shortness of breath EXAM: PORTABLE CHEST 1 VIEW COMPARISON:  02/13/2020 FINDINGS: Cardiac shadow is within normal limits. Aortic calcifications are again seen. The lungs are hyperinflated. No focal infiltrate or sizable effusion is seen.  No bony abnormality is noted. IMPRESSION: COPD without acute abnormality. Electronically Signed   By: Inez Catalina M.D.   On: 03/04/2020 08:08   Scheduled Meds:  aspirin EC  81 mg Oral Daily   feeding supplement  1 Container Oral TID BM   feeding supplement (ENSURE ENLIVE)  237 mL Oral BID BM   furosemide  40 mg Oral Daily   heparin  5,000 Units Subcutaneous Q8H   multivitamin with minerals  1 tablet Oral Daily   pantoprazole  40 mg Oral BID   polyethylene glycol  17 g Oral BID   senna-docusate  2 tablet Oral BID   sertraline  25 mg Oral Daily   sucralfate  1 g Oral TID WC & HS   tamsulosin  0.4 mg Oral QPC supper   Continuous Infusions:   LOS: 9 days   Kerney Elbe, DO [facility] PAGER is on AMION  If 7PM-7AM, please contact night-coverage [email or web address]

## 2020-03-05 DIAGNOSIS — I951 Orthostatic hypotension: Secondary | ICD-10-CM

## 2020-03-05 LAB — CBC WITH DIFFERENTIAL/PLATELET
Abs Immature Granulocytes: 0.01 10*3/uL (ref 0.00–0.07)
Basophils Absolute: 0 10*3/uL (ref 0.0–0.1)
Basophils Relative: 0 %
Eosinophils Absolute: 0 10*3/uL (ref 0.0–0.5)
Eosinophils Relative: 0 %
HCT: 37.5 % — ABNORMAL LOW (ref 39.0–52.0)
Hemoglobin: 12.9 g/dL — ABNORMAL LOW (ref 13.0–17.0)
Immature Granulocytes: 0 %
Lymphocytes Relative: 20 %
Lymphs Abs: 1.2 10*3/uL (ref 0.7–4.0)
MCH: 33.3 pg (ref 26.0–34.0)
MCHC: 34.4 g/dL (ref 30.0–36.0)
MCV: 96.9 fL (ref 80.0–100.0)
Monocytes Absolute: 0.5 10*3/uL (ref 0.1–1.0)
Monocytes Relative: 9 %
Neutro Abs: 4.5 10*3/uL (ref 1.7–7.7)
Neutrophils Relative %: 71 %
Platelets: 141 10*3/uL — ABNORMAL LOW (ref 150–400)
RBC: 3.87 MIL/uL — ABNORMAL LOW (ref 4.22–5.81)
RDW: 13.8 % (ref 11.5–15.5)
WBC: 6.3 10*3/uL (ref 4.0–10.5)
nRBC: 0 % (ref 0.0–0.2)

## 2020-03-05 LAB — COMPREHENSIVE METABOLIC PANEL
ALT: 36 U/L (ref 0–44)
AST: 21 U/L (ref 15–41)
Albumin: 1.9 g/dL — ABNORMAL LOW (ref 3.5–5.0)
Alkaline Phosphatase: 119 U/L (ref 38–126)
Anion gap: 16 — ABNORMAL HIGH (ref 5–15)
BUN: 67 mg/dL — ABNORMAL HIGH (ref 8–23)
CO2: 21 mmol/L — ABNORMAL LOW (ref 22–32)
Calcium: 8.6 mg/dL — ABNORMAL LOW (ref 8.9–10.3)
Chloride: 106 mmol/L (ref 98–111)
Creatinine, Ser: 3.3 mg/dL — ABNORMAL HIGH (ref 0.61–1.24)
GFR calc Af Amer: 19 mL/min — ABNORMAL LOW (ref >60)
GFR calc non Af Amer: 17 mL/min — ABNORMAL LOW (ref >60)
Glucose, Bld: 81 mg/dL (ref 70–99)
Potassium: 4.1 mmol/L (ref 3.5–5.1)
Sodium: 143 mmol/L (ref 135–145)
Total Bilirubin: 0.8 mg/dL (ref 0.3–1.2)
Total Protein: 4.2 g/dL — ABNORMAL LOW (ref 6.5–8.1)

## 2020-03-05 LAB — PHOSPHORUS: Phosphorus: 5.1 mg/dL — ABNORMAL HIGH (ref 2.5–4.6)

## 2020-03-05 LAB — MAGNESIUM: Magnesium: 2.5 mg/dL — ABNORMAL HIGH (ref 1.7–2.4)

## 2020-03-05 MED ORDER — SODIUM CHLORIDE 0.9 % IV BOLUS
1000.0000 mL | Freq: Once | INTRAVENOUS | Status: AC
  Administered 2020-03-05: 1000 mL via INTRAVENOUS

## 2020-03-05 MED ORDER — SODIUM CHLORIDE 0.9 % IV BOLUS: 1000.0000 mL | Freq: Once | INTRAVENOUS | Status: AC

## 2020-03-05 NOTE — Progress Notes (Signed)
PROGRESS NOTE    Jeremaine Maraj  EXB:[id number] DOB: 03-Aug-1936 DOA: 02/22/2020 PCP: Binnie Rail, MD   Brief Narrative Alvin Ingram a 84 y.o.malewith medical history significant forBPHfollowed by urology, hyperlipidemia and GERD and other comorbidities who presented to [facility] ED as advised by his niecewith concerns fordecreased p.o. intake, suspected uncontrolled depression, weight loss,and generalizedweakness. Evaluated by his PCP about 2 weeksprior. Labsshowing mildly elevated LFTs (AST of 40 and ALT of 59). On Elavil for depression which can cause elevation in LFTs. ACT of his abdomen in February (01/02/20)showed extensive sigmoid diverticulosis and a15 mm hypodense lesion of the distal body of the pancreas that was indeterminate. MRI abdomen (02/25/20) showeda cystic lesion of the pancreas, with radiology recommendation for survillance in 2 years.   My colleagues spoke to the patient's niece via phone and she is concerned that he might be depressed, recently lost a friend. Admitted to feeling depressed without suicidal or homicidal ideation. Seen by psych with recommendation to DC Elavil and start Zoloft. Appetite remained poor. Evaluated by PT OT with recommendation for SNF with 24-hour assistance/supervision. Receptive to going to SNF.    GI was consulted for Nausea, Vomiting and Weight loss.  Had EGD done 4/5 showing esophagitis and hemorrhagic gastritis.  Biopsies pending.  Also seen by ENT due to concern for hoarseness in the setting of significant weight loss in a former smoker.  No significant findings.  The day before he was feeling okay and complains that he does not really have nausea but when I saw him he did not really eat his breakfast because he was afraid of being nauseous.  Ate a little bit he states.  GI recommending continuing PPI twice daily.  PT OT recommending skilled nursing facility.    Unfortunately patient could not be discharged yesterday given  that his SNF authorization has expired and they are requesting updated PT and OT notes however patient been refusing to work with PT OT due to his fatigue.  This could potentially be a barrier to discharge given that he is improved from a GI standpoint and they have signed off.    Today the patient worked with physical therapy but then became orthostatic when standing we will give him some more fluid resuscitation and stop his Lasix and give him a 1 L fluid bolus as well as started on maintenance IV fluids at 75 MLS per hour.  We have ordered compression stockings and have the patient work with physical therapy again in the morning.   Assessment & Plan:   Principal Problem:   Failure to thrive in adult Active Problems:   AKI (acute kidney injury) ([place])   Transaminitis   Weakness   Hypernatremia   Protein-calorie malnutrition, severe  Failure to thrive in adult/ Severe weight loss/ Depression -Presented with poor oral intake, poor appetite,weight loss, uncontrolled depression -Elavil DCed and started on Zoloft 02/25/20 by Psychiatry and will continue -He had excessive sedation on Elavil and elevation in his LFTs -Encourage increase in oral protein calorie intake -Follow up with psych outpatient -See Below -Palliative care consulted and patient was not interested so of care recommending outpatient follow-up with palliative care at SNF -PT OT recommending SNF however he has been refusing to work with him but was able to work with physical therapist today and became acutely orthostatic  Esophagitis/Hemorrhagic gastritis -Management directed by GI as below -Continued IV PPI twice daily and Carafate and transitioned to po  -Continue with PPI and Carafate  Intractable nausea with  transient vomiting, improved  -Continue antiemetics PRN -Continue recommendations per GI. -GI recommended ENT evaluation for chronic hoarseness and dysphagia with weight loss nausea and vomiting and patient has  had chronic hoarseness with mild cough without symptoms of aspiration reflux laryngoscope was performed and showed normal bilateral vocal cord mobility without evidence of vocal cord nodule mass or hypopharynx -ENT believes the patient may have some vocal cord atrophy related to age changes and feels the swallowing issues may be related to chronic nausea and vomiting and GI issues and feels that there is no mucous lesion upper hypopharynx or larynx -Continue to monitor and continue with antiemetics as necessary -Patient is no longer nauseous and will continue to encourage eating  AKI on CKD 3B, slightly worsening Metabolic Acidosis. mild Hyperphosphatemia -Presented with creatinine 3.24, proteuniuria -Baseline creatinine 2 with GFR 38 -BUN/creatinine remained relatively stable since admission but is now acutely worsening and worsened from 3.03 and is now at a 67/3.30 -Patient had a CO2 of 21, anion gap of 16, chloride level of 106 -Continue to avoid nephrotoxins, hypotension, contrast dyes and renally adjust medications -Continue to monitor urine output -Follow recommendations per nephrology; nephrology feels that he may have some nephrotic syndrome and have sent off serologies for prognostic purposes but patient not a good candidate for immunosuppression given his age and felt in condition at this time and they are rescheduling his outpatient office visit for 3 to 4 weeks and then recommending p.o. Lasix daily for 7 to 10 days and then stopping; today was day 3 of his Lasix but will stop given worsened Renal Fxn and Orthostatic Hypotension -Given a 1 L normal saline bolus as well as start the patient on maintenance IV fluids at 75 mL's per hour -Repeat CMP within the a.m.  Acute Transaminitisin the setting of Elavil, improving and resolved -Possibly iatrogenic, Elavil can increase LFTs -LFTs are trending down and have improved as AST is 21 and ALT is 36 -Right upper quadrant abdominal  ultrasound 02/22/20 negative. No gallstones or wall thickening visualized. No sonographic Murphy sign noted by sonographer. -Acute hepatitis panel negative -Continue to avoid hepatotoxic agents  15 mm low attenuating lesion with fluid attenuation along the posterior distal body of the pancreas/B/L renal cysts -MRI shows cystic lesion of the pancreas, -Recommend surveillance withrepeat imagings in 2 years.  Resolved Hypovolemic hypernatremia with iV fluid -Presented with serum sodium 153 --> 151 --> 148 --> 147 --> 145 --> 146 --> 142 --> 141 -> 143 -Improved on D5% at 50 cc/hr but will stop; he is started on Lasix but will stop p.o. Lasix and start IV fluid hydration as above -Continue to Monitor volume status but will stop IVF   Chronic Diastolic CHF -Last 2D echo done on 12/17/2019 showed LVEF 65-70% With grade 1 diastolic dysfunction -Initially held off lasix due to recent dehydration and AKI but this has been resumed and now will stop again -Was getting IVF Hydration at 50 mL/hr with D5W but will stop and start Lasix 40 mg po Daily for 7 Days at the recommendation of nephrology after his given his significant orthostatic hypotension and worsening renal function -Given a 1 L normal saline bolus and will start maintenance fluids at 75 MLS per hour -Continue strict I's and O's and daily weight; patient is +1.098 L since admission -Continue to monitor volume status extremely carefully  Right Pleural effusion on Renal US proved and resolved -Incidental finding -Monitor volume status on IV fluid and will stop IV fluid  hydration and start po Lasix 40 mg Daily -Continue incentive spirometer -Maintain O2 sat>92% -Repeat chest x-ray showed "Cardiac shadow is within normal limits. Aortic calcifications are again seen. The lungs are hyperinflated. No focal infiltrate or sizable effusion is seen. No bony abnormality is noted." -Continued with Lasix as above we will now stop and will give  fluid resuscitation given orthostatic hypotension  Orthostatic Hypotension -Significant -We will stop p.o. Lasix and give a 1 Liter Bolus and start Maintenance IVF at NS at 75 mL/hr  GERD -GI recommends pantoprazole 40 mg p.o. twice daily for 4 weeks followed by 40 mg p.o. once daily for another 4 weeks and also recommending Carafate for 2 weeks; Was changed to po  -Post EGD 03/01/2020 by Dr. Regina Eck with findings as stated above -GI recommending following up with Dr. Watt Climes or Primary GI doctor made off in 6 to 8 weeks after discharge  BPH -Continue Tamsulosin 0.4 mg po daily after supper -Follow up with Urology in the outpatient setting   Ambulatory dysfunction with frequent falls -Has had 3 falls at home within the last 2 weeks per his niece -[name] in the bathroom hospital 3/29, denies hitting his head -Denies feeling dizzy prior to falling -Lives alone, has not been able to appropriately take care of himself per his niece. -Continue PT OT with assistance and fall precautions. -TOC assisting with SNF placement as patient is agreeable however he is now recently refusing PT OT evaluation to SNF authorization is expired and they request updated PT notes  Avascular necrosis of the left femoral neck without acute fracture or cortical collapse from CT abd/pelvis wo contrast 01/02/20. -Fall Precautions -Follow up as an outpatient and continue PT/OT efforts  Normocytic Anemia -Patient's hemoglobin/hematocit is now 12.9/37.5 -Continue to monitor for signs and symptoms of bleeding; currently no overt bleeding noted -Repeat CBC in a.m.  Thrombocytopenia -Mild but appears relatively stable for last few times it is checked and is 141,000 today -Continue to monitor for signs and symptoms of bleeding; -Currently no overt bleeding noted -Repeat CBC in a.m.  DVT prophylaxis: Heparin 5,000 units sq q8h  Code Status: FULL CODE  Family Communication: No family present at bedside Disposition  Plan: Patient is from home and now requires SNF level of care given his significant weakness and deconditioning.  GI is now signed off the case given that his symptomology is improved somewhat.  We will need close monitoring for his oral intake and can be discharged in the next 24 to 48 hours and follow-up with nephrology, palliative care, psychiatry as well as ENT in the outpatient setting.  A barrier to his discharge however is that his SNF authorization is expired and that he has been refusing to work with PT and OT recently suspect due to his depression and without updated PT notes he cannot safely be discharged to skilled nursing facility as he cannot be accepted despite him having a bed there.  Consultants:   Gastroenterology  Palliative Care Medicine  ENT  Nephrology   Psychiatry    Procedures:  EGD 4/5 Findings:      The Z-line was regular and was found 42 cm from the incisors.      LA Grade C (one or more mucosal breaks continuous between tops of 2 or       more mucosal folds, less than 75% circumference) esophagitis with no       bleeding was found 40 cm from the incisors. Biopsies were taken with a  cold forceps for histology.      Scattered severe inflammation with hemorrhage characterized by       congestion (edema), erosions, erythema and friability was found in the       gastric body and in the gastric antrum. Biopsies were taken with a cold       forceps for histology.      The cardia and gastric fundus were normal on retroflexion.      Extrinsic compression on the stomach was found in the gastric antrum.      The duodenal bulb, first portion of the duodenum and second portion of       the duodenum were normal. Impression:               - Z-line regular, 42 cm from the incisors.                           - LA Grade C esophagitis with no bleeding. Biopsied.                           - Gastritis with hemorrhage. Biopsied.                           - Extrinsic  compression in the gastric antrum.                           - Normal duodenal bulb, first portion of the                            duodenum and second portion of the duodenum.   Antimicrobials:  Anti-infectives (From admission, onward)   None     Subjective: Seen and examined and he again seen withdrawn and lying in bed wanting to sleep.  If I did work with physical therapy he is extremely orthostatic.  He denied any complaints currently but wanted me to leave him alone as he wanted to rest as he states that he is not been sleeping.  No other concerns or complaints at this time.  Objective: Vitals:   03/05/20 1110 03/05/20 1111 03/05/20 1120 03/05/20 1402  BP: (!) 69/56 (!) 70/51 116/78 127/71  Pulse: 92 91 87 79  Resp:    18  Temp:    97.9 F (36.6 C)  TempSrc:    Oral  SpO2:    100%  Weight:      Height:        Intake/Output Summary (Last 24 hours) at 03/05/2020 1640 Last data filed at 03/05/2020 1300 Gross per 24 hour  Intake 360 ml  Output -  Net 360 ml   Filed Weights   03/01/20 0547 03/01/20 1204 03/02/20 0500  Weight: 66.2 kg 66.2 kg 66 kg   Examination: Physical Exam:  Constitutional: Thin elderly African-American male currently in no acute distress but does appear very depressed and withdrawn and wanting to sleep again Eyes: Lids and conjunctivae normal, sclerae anicteric  ENMT: External Ears, Nose appear normal. Grossly normal hearing. Neck: Appears normal, supple, no cervical masses, normal ROM, no appreciable thyromegaly; no JVD Respiratory: Slightly diminished to auscultation bilaterally, no wheezing, rales, rhonchi or crackles. Normal respiratory effort and patient is not tachypenic. No accessory muscle use.  Breathing Cardiovascular: RRR, no murmurs / rubs / gallops. S1 and  S2 auscultated.  Very minimal extremity edema Abdomen: Soft, non-tender, non-distended. Bowel sounds positive.  GU: Deferred. Musculoskeletal: No clubbing / cyanosis of digits/nails.  No joint deformity upper and lower extremities.  Skin: No rashes, lesions, ulcers on limited skin evaluation. No induration; Warm and dry.  Neurologic: CN 2-12 grossly intact with no focal deficits. Romberg sign and cerebellar reflexes not assessed.  Psychiatric: Normal judgment and insight. Alert and oriented x 3.  Depressed appearing mood and flat affect.   Data Reviewed: I have personally reviewed following labs and imaging studies  CBC: Recent Labs  Lab 02/29/20 0519 02/29/20 0519 03/01/20 0602 03/02/20 0524 03/03/20 0535 03/04/20 0538 03/05/20 0524  WBC 5.9   < > 5.7 6.8 6.2 6.0 6.3  NEUTROABS 4.0  --   --   --   --  4.2 4.5  HGB 11.0*   < > 11.0* 12.2* 11.4* 12.3* 12.9*  HCT 33.0*   < > 33.5* 36.6* 33.6* 37.5* 37.5*  MCV 98.8   < > 99.4 99.2 97.7 98.7 96.9  PLT 131*   < > 123* 135* 121* 129* 141*   < > = values in this interval not displayed.   Basic Metabolic Panel: Recent Labs  Lab 03/01/20 0602 03/02/20 0524 03/03/20 0535 03/04/20 0538 03/05/20 0524  NA 145 146* 142 141 143  K 4.0 4.5 3.9 4.2 4.1  CL 117* 115* 111 108 106  CO2 22 24 23 24  21*  GLUCOSE 111* 109* 100* 96 81  BUN 70* 73* 65* 66* 67*  CREATININE 3.21* 3.03* 3.09* 3.13* 3.30*  CALCIUM 8.1* 8.3* 8.1* 8.2* 8.6*  MG  --   --   --  2.5* 2.5*  PHOS  --   --   --  4.7* 5.1*   GFR: Estimated Creatinine Clearance: 16.4 mL/min (A) (by C-G formula based on SCr of 3.3 mg/dL (H)). Liver Function Tests: Recent Labs  Lab 02/29/20 0519 03/02/20 0524 03/03/20 0535 03/04/20 0538 03/05/20 0524  AST 31 25 22 21 21   ALT 84* 61* 47* 41 36  ALKPHOS 133* 128* 117 124 119  BILITOT 0.5 0.5 0.4 0.8 0.8  PROT 4.0* 4.2* 3.9* 4.1* 4.2*  ALBUMIN 1.8* 2.0* 1.8* 1.9* 1.9*   Recent Labs  Lab 02/28/20 1156  LIPASE 19   No results for input(s): AMMONIA in the last 168 hours. Coagulation Profile: No results for input(s): INR, PROTIME in the last 168 hours. Cardiac Enzymes: No results for input(s): CKTOTAL, CKMB,  CKMBINDEX, TROPONINI in the last 168 hours. BNP (last 3 results) Recent Labs    12/29/19 1639 02/13/20 1431  PROBNP 224.0* 84.0   HbA1C: No results for input(s): HGBA1C in the last 72 hours. CBG: No results for input(s): GLUCAP in the last 168 hours. Lipid Profile: No results for input(s): CHOL, HDL, LDLCALC, TRIG, CHOLHDL, LDLDIRECT in the last 72 hours. Thyroid Function Tests: No results for input(s): TSH, T4TOTAL, FREET4, T3FREE, THYROIDAB in the last 72 hours. Anemia Panel: No results for input(s): VITAMINB12, FOLATE, FERRITIN, TIBC, IRON, RETICCTPCT in the last 72 hours. Sepsis Labs: No results for input(s): PROCALCITON, LATICACIDVEN in the last 168 hours.  Recent Results (from the past 240 hour(s))  SARS CORONAVIRUS 2 (TAT 6-24 HRS) Nasopharyngeal Nasopharyngeal Swab     Status: None   Collection Time: 02/27/20  6:32 PM   Specimen: Nasopharyngeal Swab  Result Value Ref Range Status   SARS Coronavirus 2 NEGATIVE NEGATIVE Final    Comment: (NOTE) SARS-CoV-2 target nucleic acids are  NOT DETECTED. The SARS-CoV-2 RNA is generally detectable in upper and lower respiratory specimens during the acute phase of infection. Negative results do not preclude SARS-CoV-2 infection, do not rule out co-infections with other pathogens, and should not be used as the sole basis for treatment or other patient management decisions. Negative results must be combined with clinical observations, patient history, and epidemiological information. The expected result is Negative. Fact Sheet for Patients: SugarRoll.be Fact Sheet for Healthcare Providers: https://www.woods-mathews.com/ This test is not yet approved or cleared by the [place] FDA and  has been authorized for detection and/or diagnosis of SARS-CoV-2 by FDA under an Emergency Use Authorization (EUA). This EUA will remain  in effect (meaning this test can be used) for the duration of the  COVID-19 declaration under Section 56 4(b)(1) of the Act, 21 U.S.C. section 360bbb-3(b)(1), unless the authorization is terminated or revoked sooner. Performed at [facility], [place] [address], [place], [place] [address]      RN Pressure Injury Documentation:     Estimated body mass index is 19.2 kg/m as calculated from the following:   Height as of this encounter: 6\' 1"  (1.854 m).   Weight as of this encounter: 66 kg.  Malnutrition Type:  Nutrition Problem: Severe Malnutrition Etiology: acute illness(unknown etiology)   Malnutrition Characteristics:  Signs/Symptoms: moderate fat depletion, moderate muscle depletion, severe muscle depletion, percent weight loss Percent weight loss: 20 %   Nutrition Interventions:  Interventions: Ensure Enlive (each supplement provides 350kcal and 20 grams of protein), Magic cup, MVI   Radiology Studies: DG CHEST PORT 1 VIEW  Result Date: 03/04/2020 CLINICAL DATA:  Shortness of breath EXAM: PORTABLE CHEST 1 VIEW COMPARISON:  02/13/2020 FINDINGS: Cardiac shadow is within normal limits. Aortic calcifications are again seen. The lungs are hyperinflated. No focal infiltrate or sizable effusion is seen. No bony abnormality is noted. IMPRESSION: COPD without acute abnormality. Electronically Signed   By: Inez Catalina M.D.   On: 03/04/2020 08:08   Scheduled Meds: . aspirin EC  81 mg Oral Daily  . feeding supplement  1 Container Oral TID BM  . feeding supplement (ENSURE ENLIVE)  237 mL Oral BID BM  . furosemide  40 mg Oral Daily  . heparin  5,000 Units Subcutaneous Q8H  . multivitamin with minerals  1 tablet Oral Daily  . pantoprazole  40 mg Oral BID  . polyethylene glycol  17 g Oral BID  . senna-docusate  2 tablet Oral BID  . sertraline  25 mg Oral Daily  . sucralfate  1 g Oral TID WC & HS  . tamsulosin  0.4 mg Oral QPC supper   Continuous Infusions:   LOS: 10 days   Kerney Elbe, DO [facility] PAGER is on AMION   If 7PM-7AM, please contact night-coverage [email or web address]

## 2020-03-05 NOTE — Progress Notes (Signed)
Physical Therapy Treatment/Re-Assessment Patient Details Name: Tri Chittick MRN: [id number] DOB: 1936/08/04 Today's Date: 03/05/2020    History of Present Illness Bunny Lowdermilk is a 84 y.o. male with medical history significant for BPH, hyperlipidemia and GERD who presents with concerns of decreased p.o. intake and weakness.    PT Comments    Patient has made limited progress with therapy due to fatigue. He requires maximum encouragement for therapy participation and during functional mobility is limited by orthostatic hypotension. Patient was able to tolerate sit<>stand transfers with RW today and requires  Mod assist for safety and to steady with standing. Patient BP dropped with standing (see vitals for details) and recovered to 116/78 after reclining in bedside chair. He will continue to benefit from skilled PT interventions to address functional impairments and progress mobility. Acute PT will continue to progress as able.  Orthostatic VS for the past 24 hrs:  BP- Lying Pulse- Lying BP- Sitting Pulse- Sitting BP- Standing at 0 minutes Pulse- Standing at 0 minutes  03/05/20 1100 (!) 104/93 73 (!) 81/56 81 (!) 55/41 90    Vitals with BMI 03/05/2020 (1120) 03/05/2020 (1111) 7/0/3500 (9381)  Systolic 829 70 69  Diastolic 78 51 56  Pulse 87 91 92      Follow Up Recommendations  SNF     Equipment Recommendations  Rolling walker with 5" wheels    Recommendations for Other Services       Precautions / Restrictions Precautions Precautions: Fall Precaution Comments: monitor BP (orthostatic) Restrictions Weight Bearing Restrictions: No    Mobility  Bed Mobility Overal bed mobility: Needs Assistance Bed Mobility: Supine to Sit     Supine to sit: HOB elevated;Min assist     General bed mobility comments: pt requires extra time, initated movement himself and requires assist to bring LE's off EOB and raise trunk upright to sit EOB.  Transfers Overall transfer level: Needs  assistance Equipment used: Rolling walker (2 wheeled) Transfers: Sit to/from [facility] Sit to Stand: Mod assist;From elevated surface Stand pivot transfers: Mod assist;From elevated surface       General transfer comment: pt fearful of falling. pt requries elevated surface to stand with RW and mod assist for power up and to steady. patient's BP dropped in standing and pt reported dizziness requesting return to sit EOB. Pt rested and some improvement in dizziness. 2nd stnad performed and step/pivot to reclienr with RW and mod assist for management and step sequencing. Cues requried for safe reach back to recliner.   Ambulation/Gait    Stairs     Wheelchair Mobility    Modified Rankin (Stroke Patients Only)       Balance Overall balance assessment: Needs assistance Sitting-balance support: Feet supported Sitting balance-Leahy Scale: Fair     Standing balance support: Bilateral upper extremity supported;During functional activity Standing balance-Leahy Scale: Poor         Cognition Arousal/Alertness: Awake/alert Behavior During Therapy: WFL for tasks assessed/performed Overall Cognitive Status: Within Functional Limits for tasks assessed      General Comments: requires max encouragement to participate in therapy, does not like questions      Exercises      General Comments General comments (skin integrity, edema, etc.): PT remains orthostatic. see vitals for details      Pertinent Vitals/Pain Pain Assessment: Faces Faces Pain Scale: Hurts a little bit Pain Location: generalized Pain Descriptors / Indicators: Discomfort Pain Intervention(s): Monitored during session;Limited activity within patient's tolerance;Repositioned  PT Goals (current goals can now be found in the care plan section) Acute Rehab PT Goals Patient Stated Goal: to stop having questions and rest PT Goal Formulation: With patient Time For Goal Achievement:  03/12/20 Potential to Achieve Goals: Fair Progress towards PT goals: Not progressing toward goals - comment;Goals downgraded-see care plan    Frequency    Min 3X/week      PT Plan Current plan remains appropriate    AM-PAC PT "6 Clicks" Mobility   Outcome Measure  Help needed turning from your back to your side while in a flat bed without using bedrails?: A Little Help needed moving from lying on your back to sitting on the side of a flat bed without using bedrails?: A Little Help needed moving to and from a bed to a chair (including a wheelchair)?: A Lot Help needed standing up from a chair using your arms (e.g., wheelchair or bedside chair)?: A Lot Help needed to walk in hospital room?: A Lot Help needed climbing 3-5 steps with a railing? : A Lot 6 Click Score: 14    End of Session Equipment Utilized During Treatment: Gait belt Activity Tolerance: Patient tolerated treatment well;Treatment limited secondary to medical complications (Comment)(limited activity due to hypotension) Patient left: in chair;with call bell/phone within reach;with chair alarm set Nurse Communication: Mobility status(hypotension) PT Visit Diagnosis: Unsteadiness on feet (R26.81);Muscle weakness (generalized) (M62.81);Difficulty in walking, not elsewhere classified (R26.2)     Time: 4967-5916 PT Time Calculation (min) (ACUTE ONLY): 28 min  Charges:  $Therapeutic Activity: 23-37 mins                     Verner Mould, DPT Physical Therapist with [facility] [phone]  03/05/2020 12:47 PM

## 2020-03-06 LAB — COMPREHENSIVE METABOLIC PANEL
ALT: 32 U/L (ref 0–44)
AST: 19 U/L (ref 15–41)
Albumin: 1.9 g/dL — ABNORMAL LOW (ref 3.5–5.0)
Alkaline Phosphatase: 114 U/L (ref 38–126)
Anion gap: 14 (ref 5–15)
BUN: 76 mg/dL — ABNORMAL HIGH (ref 8–23)
CO2: 18 mmol/L — ABNORMAL LOW (ref 22–32)
Calcium: 8.5 mg/dL — ABNORMAL LOW (ref 8.9–10.3)
Chloride: 109 mmol/L (ref 98–111)
Creatinine, Ser: 3.54 mg/dL — ABNORMAL HIGH (ref 0.61–1.24)
GFR calc Af Amer: 18 mL/min — ABNORMAL LOW (ref >60)
GFR calc non Af Amer: 15 mL/min — ABNORMAL LOW (ref >60)
Glucose, Bld: 81 mg/dL (ref 70–99)
Potassium: 4.2 mmol/L (ref 3.5–5.1)
Sodium: 141 mmol/L (ref 135–145)
Total Bilirubin: 0.7 mg/dL (ref 0.3–1.2)
Total Protein: 4.2 g/dL — ABNORMAL LOW (ref 6.5–8.1)

## 2020-03-06 LAB — CBC WITH DIFFERENTIAL/PLATELET
Abs Immature Granulocytes: 0.02 10*3/uL (ref 0.00–0.07)
Basophils Absolute: 0 10*3/uL (ref 0.0–0.1)
Basophils Relative: 0 %
Eosinophils Absolute: 0 10*3/uL (ref 0.0–0.5)
Eosinophils Relative: 0 %
HCT: 39.3 % (ref 39.0–52.0)
Hemoglobin: 13.4 g/dL (ref 13.0–17.0)
Immature Granulocytes: 0 %
Lymphocytes Relative: 19 %
Lymphs Abs: 1.3 10*3/uL (ref 0.7–4.0)
MCH: 32.8 pg (ref 26.0–34.0)
MCHC: 34.1 g/dL (ref 30.0–36.0)
MCV: 96.1 fL (ref 80.0–100.0)
Monocytes Absolute: 0.5 10*3/uL (ref 0.1–1.0)
Monocytes Relative: 7 %
Neutro Abs: 5.1 10*3/uL (ref 1.7–7.7)
Neutrophils Relative %: 74 %
Platelets: 144 10*3/uL — ABNORMAL LOW (ref 150–400)
RBC: 4.09 MIL/uL — ABNORMAL LOW (ref 4.22–5.81)
RDW: 13.7 % (ref 11.5–15.5)
WBC: 7 10*3/uL (ref 4.0–10.5)
nRBC: 0 % (ref 0.0–0.2)

## 2020-03-06 LAB — MAGNESIUM: Magnesium: 2.4 mg/dL (ref 1.7–2.4)

## 2020-03-06 LAB — PHOSPHORUS: Phosphorus: 6 mg/dL — ABNORMAL HIGH (ref 2.5–4.6)

## 2020-03-06 MED ORDER — SODIUM BICARBONATE-DEXTROSE 150-5 MEQ/L-% IV SOLN
150.0000 meq | INTRAVENOUS | Status: DC
  Administered 2020-03-06 (×2): 150 meq via INTRAVENOUS
  Filled 2020-03-06 (×3): qty 1000

## 2020-03-06 MED ORDER — SUCRALFATE 1 GM/10ML PO SUSP
1.0000 g | Freq: Three times a day (TID) | ORAL | Status: DC
  Administered 2020-03-06 – 2020-03-09 (×9): 1 g via ORAL
  Filled 2020-03-06 (×10): qty 10

## 2020-03-06 NOTE — Progress Notes (Signed)
PROGRESS NOTE    Alvin Ingram  OXB:[id number] DOB: 11/28/1935 DOA: 02/22/2020 PCP: Binnie Rail, MD   Brief Narrative Alvin Ingram a 84 y.o.malewith medical history significant forBPHfollowed by urology, hyperlipidemia and GERD and other comorbidities who presented to [facility] ED as advised by his niecewith concerns fordecreased p.o. intake, suspected uncontrolled depression, weight loss,and generalizedweakness. Evaluated by his PCP about 2 weeksprior. Labsshowing mildly elevated LFTs (AST of 40 and ALT of 59). On Elavil for depression which can cause elevation in LFTs. ACT of his abdomen in February (01/02/20)showed extensive sigmoid diverticulosis and a15 mm hypodense lesion of the distal body of the pancreas that was indeterminate. MRI abdomen (02/25/20) showeda cystic lesion of the pancreas, with radiology recommendation for survillance in 2 years.   My colleagues spoke to the patient's niece via phone and she is concerned that he might be depressed, recently lost a friend. Admitted to feeling depressed without suicidal or homicidal ideation. Seen by psych with recommendation to DC Elavil and start Zoloft. Appetite remained poor. Evaluated by PT OT with recommendation for SNF with 24-hour assistance/supervision. Receptive to going to SNF.    GI was consulted for Nausea, Vomiting and Weight loss.  Had EGD done 4/5 showing esophagitis and hemorrhagic gastritis.  Biopsies pending.  Also seen by ENT due to concern for hoarseness in the setting of significant weight loss in a former smoker.  No significant findings.   03/03/20 he was feeling okay and complains that he does not really have nausea but when I saw him he did not really eat his breakfast because he was afraid of being nauseous.  Ate a little bit he states.  GI recommending continuing PPI twice daily.  PT OT recommending skilled nursing facility.    03/04/20 Unfortunately patient could not be discharged yesterday given  that his SNF authorization has expired and they are requesting updated PT and OT notes however patient been refusing to work with PT OT due to his fatigue.  This could potentially be a barrier to discharge given that he is improved from a GI standpoint and they have signed off.    03/05/20 Today the patient worked with physical therapy but then became orthostatic when standing we will give him some more fluid resuscitation and stop his Lasix and give him a 1 L fluid bolus as well as started on maintenance IV fluids at 75 MLS per hour.  We have ordered compression stockings and have the patient work with physical therapy again in the morning.  03/06/20 patient has had slightly worsening renal function and his fluids have now been adjusted and started on bicarbonate given that he has a metabolic acidosis.  Patient was hypotensive yesterday in the setting of his orthostasis and did not want to work with the nurse today to have orthostatics done today.  He wanted to sleep.  Remains depressed and will continue to monitor his renal function if is not improving will consult nephrology for further evaluations   Assessment & Plan:   Principal Problem:   Failure to thrive in adult Active Problems:   AKI (acute kidney injury) ([place])   Transaminitis   Weakness   Hypernatremia   Protein-calorie malnutrition, severe  Failure to thrive in adult/ Severe weight loss/ Depression -Presented with poor oral intake, poor appetite,weight loss, uncontrolled depression -Elavil DCed and started on Zoloft 02/25/20 by Psychiatry and will continue -He had excessive sedation on Elavil and elevation in his LFTs -Encourage increase in oral protein calorie intake -Follow up  with psych outpatient when he gets out of the hospital -See Below -Palliative care consulted and patient was not interested so of care recommending outpatient follow-up with palliative care at SNF -PT OT recommending SNF however he has been refusing to  work with him but was able to work with physical therapist today and yesterday when he did became orthostatic -Do not want orthostatics done today given that he wanted to rest -Patient is very depressed and is not motivated and only wants to sleep whenever I see him; will reconsult psychiatry in a.m.  Esophagitis/Hemorrhagic gastritis -Management directed by GI as below -Continued IV PPI twice daily and Carafate and transitioned to po  -Continue with PPI and Carafate p.o.  Intractable nausea with transient vomiting, improved  -Continue antiemetics PRN -Continue recommendations per GI. -GI recommended ENT evaluation for chronic hoarseness and dysphagia with weight loss nausea and vomiting and patient has had chronic hoarseness with mild cough without symptoms of aspiration reflux laryngoscope was performed and showed normal bilateral vocal cord mobility without evidence of vocal cord nodule mass or hypopharynx -ENT believes the patient may have some vocal cord atrophy related to age changes and feels the swallowing issues may be related to chronic nausea and vomiting and GI issues and feels that there is no mucous lesion upper hypopharynx or larynx -Continue to monitor and continue with antiemetics as necessary -Patient is no longer nauseous and will continue to encourage eating but nursing states that he is not been eating very much  AKI on CKD 3B, worsening Metabolic Acidosis. mild Hyperphosphatemia -Presented with creatinine 3.24, proteuniuria -Baseline creatinine 2 with GFR 38 -BUN/creatinine remained relatively stable since admission but is now acutely worsening and worsened from 3.03 and has gone from 67/3.30 to 76/3.54 -Patient had a CO2 of 21, anion gap of 16, chloride level of 106 and today his CO2 is 18, chloride level is 109, and anion gap was 14 -Continue to avoid nephrotoxins, hypotension, contrast dyes and renally adjust medications -Continue to monitor urine output -Follow  recommendations per nephrology; nephrology feels that he may have some nephrotic syndrome and have sent off serologies for prognostic purposes but patient not a good candidate for immunosuppression given his age and felt in condition at this time and they are rescheduling his outpatient office visit for 3 to 4 weeks and then recommending p.o. Lasix daily for 7 to 10 days and then stopping; yesterday was day 3 of his Lasix but will stop given worsened Renal Fxn and Orthostatic Hypotension -Yesterday was given a 1 L normal saline bolus and started on maintenance IV fluids with 75 MLS per hour however will change this to sodium bicarb and -Repeat CMP within the a.m.  Acute Transaminitisin the setting of Elavil, improving and resolved -Possibly iatrogenic, Elavil can increase LFTs -LFTs are trending down and have improved as AST is 21 and ALT is 1932 -Right upper quadrant abdominal ultrasound 02/22/20 negative. No gallstones or wall thickening visualized. No sonographic Murphy sign noted by sonographer. -Acute hepatitis panel negative -Continue to avoid hepatotoxic agents  15 mm low attenuating lesion with fluid attenuation along the posterior distal body of the pancreas/B/L renal cysts -MRI shows cystic lesion of the pancreas, -Recommend surveillance withrepeat imagings in 2 years.  Resolved Hypovolemic hypernatremia with iV fluid -Presented with serum sodium 153 --> and improved to 141 -Improved on D5% at 50 cc/hr but will stop; he is started on Lasix but will stop p.o. Lasix and start IV fluid hydration as above and  has been changed to sodium bicarbonate -Continue to Monitor volume status but will stop IVF   Chronic Diastolic CHF -Last 2D echo done on 12/17/2019 showed LVEF 65-70% With grade 1 diastolic dysfunction -Initially held off lasix due to recent dehydration and AKI but this has been resumed and now will stop again -Was getting IVF Hydration at 50 mL/hr with D5W but will stop and  start Lasix 40 mg po Daily for 7 Days at the recommendation of nephrology after his given his significant orthostatic hypotension and worsening renal function -Given a 1 L normal saline bolus and will start maintenance fluids at 75 MLS per hour -Continue strict I's and O's and daily weight; patient is + 1.397 L since admission -Continue to monitor volume status extremely carefully  Right Pleural effusion on Renal US proved and resolved -Incidental finding -Monitor volume status on IV fluid and will stop IV fluid hydration and start po Lasix 40 mg Daily -Continue incentive spirometer -Maintain O2 sat>92% -Repeat chest x-ray showed "Cardiac shadow is within normal limits. Aortic calcifications are again seen. The lungs are hyperinflated. No focal infiltrate or sizable effusion is seen. No bony abnormality is noted." -Continued with Lasix as above we will now stop and will give fluid resuscitation given orthostatic hypotension and this has been changed to sodium bicarbonate  Orthostatic Hypotension -Significant -Stopped  p.o. Lasix and give a 1 Liter Bolus and start Maintenance IVF at NS at 75 mL/hr maintenance fluid has not been changed to sodium bicarbonate at 75 mL's per hour  GERD -GI recommends pantoprazole 40 mg p.o. twice daily for 4 weeks followed by 40 mg p.o. once daily for another 4 weeks and also recommending Carafate for 2 weeks; Was changed to po  -Post EGD 03/01/2020 by Dr. Regina Eck with findings as stated above -GI recommending following up with Dr. Watt Climes or Primary GI doctor made off in 6 to 8 weeks after discharge  BPH -Continue Tamsulosin 0.4 mg po daily after supper -Follow up with Urology in the outpatient setting   Ambulatory dysfunction with frequent falls -Has had 3 falls at home within the last 2 weeks per his niece -[name] in the bathroom hospital 3/29, denies hitting his head -Denies feeling dizzy prior to falling -Lives alone, has not been able to appropriately  take care of himself per his niece. -Continue PT OT with assistance and fall precautions. -TOC assisting with SNF placement as patient is agreeable however he is now recently refusing PT OT evaluation to SNF authorization is expired and they request updated PT notes; patient has been acutely orthostatic and did not want to try and repeat them today given his weakness  Avascular necrosis of the left femoral neck without acute fracture or cortical collapse from CT abd/pelvis wo contrast 01/02/20. -Fall Precautions -Follow up as an outpatient and continue PT/OT efforts  Normocytic Anemia -Patient's hemoglobin/hematocit is now 13.4/39.3 -Continue to monitor for signs and symptoms of bleeding; currently no overt bleeding noted -Repeat CBC in a.m.  Thrombocytopenia -Mild but appears relatively stable for last few times it is checked and is 144,000 today -Continue to monitor for signs and symptoms of bleeding; -Currently no overt bleeding noted -Repeat CBC in a.m.  DVT prophylaxis: Heparin 5,000 units sq q8h  Code Status: FULL CODE  Family Communication: No family present at bedside Disposition Plan: Patient is from home and now requires SNF level of care given his significant weakness and deconditioning.  GI is now signed off the case given that his  symptomology is improved somewhat but he is severely depressed and has not been very motivated to work with therapies.  He yesterday became acutely orthostatic and his Lasix was stopped..  We will need close monitoring for his oral intake. A barrier to his discharge however is that his SNF authorization is expired and that he has been refusing to work with PT and OT recently suspect due to his depression and without updated PT notes he cannot safely be discharged to skilled nursing facility as he cannot be accepted despite him having a bed there.  When he did work with the physical therapist yesterday he is orthostatic in we will need to ensure there is  no longer orthostatic prior to safe discharge  Consultants:   Gastroenterology  [place]  ENT  Nephrology   Psychiatry and will reconsult them again in the a.m.   Procedures:  EGD 4/5 Findings:      The Z-line was regular and was found 42 cm from the incisors.      LA Grade C (one or more mucosal breaks continuous between tops of 2 or       more mucosal folds, less than 75% circumference) esophagitis with no       bleeding was found 40 cm from the incisors. Biopsies were taken with a       cold forceps for histology.      Scattered severe inflammation with hemorrhage characterized by       congestion (edema), erosions, erythema and friability was found in the       gastric body and in the gastric antrum. Biopsies were taken with a cold       forceps for histology.      The cardia and gastric fundus were normal on retroflexion.      Extrinsic compression on the stomach was found in the gastric antrum.      The duodenal bulb, first portion of the duodenum and second portion of       the duodenum were normal. Impression:               - Z-line regular, 42 cm from the incisors.                           - LA Grade C esophagitis with no bleeding. Biopsied.                           - Gastritis with hemorrhage. Biopsied.                           - Extrinsic compression in the gastric antrum.                           - Normal duodenal bulb, first portion of the                            duodenum and second portion of the duodenum.   Antimicrobials:  Anti-infectives (From admission, onward)   None     Subjective: Seen and examined and he remains withdrawn and in bed wanting to sleep every single time I see him.  He did not want a work with the therapist because he feels too weak and just wants to sleep.  States that he is  doing okay but wants to be left alone.  No other concerns or complaints at this time.  Objective: Vitals:   03/05/20 1402 03/05/20 2259  03/06/20 0610 03/06/20 1412  BP: 127/71 124/78 101/66 125/73  Pulse: 79 73 79 71  Resp: 18 16 16 17   Temp: 97.9 F (36.6 C) (!) 97.4 F (36.3 C) (!) 97.4 F (36.3 C) (!) 97.5 F (36.4 C)  TempSrc: Oral Oral Oral Oral  SpO2: 100% 100% 98% 99%  Weight:      Height:        Intake/Output Summary (Last 24 hours) at 03/06/2020 2013 Last data filed at 03/06/2020 1800 Gross per 24 hour  Intake 1157.97 ml  Output 1550 ml  Net -392.03 ml   Filed Weights   03/01/20 0547 03/01/20 1204 03/02/20 0500  Weight: 66.2 kg 66.2 kg 66 kg   Examination: Physical Exam:  Constitutional: Early African-American male in no acute distress and is resting but appears very depressed and very withdrawn, Eyes: Lids and conjunctivae normal, sclerae anicteric  ENMT: External Ears, Nose appear normal. Grossly normal hearing.  Neck: Appears normal, supple, no cervical masses, normal ROM, no appreciable thyromegaly: No JVD Respiratory: Diminished to auscultation bilaterally, no wheezing, rales, rhonchi or crackles. Normal respiratory effort and patient is not tachypenic. No accessory muscle use.  Unlabored breathing Cardiovascular: RRR, no murmurs / rubs / gallops. S1 and S2 auscultated.  Trace extremity edema Abdomen: Soft, non-tender, non-distended. No masses palpated. No appreciable hepatosplenomegaly. Bowel sounds positive.  GU: Deferred. Musculoskeletal: No clubbing / cyanosis of digits/nails. No joint deformity upper and lower extremities.  Skin: No rashes, lesions, ulcers on limited skin evaluation. No induration; Warm and dry.  Neurologic: CN 2-12 grossly intact with no focal deficits. Romberg sign cerebellar reflexes not assessed.  Psychiatric: Normal judgment and insight.  Somnolent and wanting to sleep.  Depressed appearing mood and flat affect.   Data Reviewed: I have personally reviewed following labs and imaging studies  CBC: Recent Labs  Lab 02/29/20 0519 03/01/20 0602 03/02/20 0524  03/03/20 0535 03/04/20 0538 03/05/20 0524 03/06/20 0547  WBC 5.9   < > 6.8 6.2 6.0 6.3 7.0  NEUTROABS 4.0  --   --   --  4.2 4.5 5.1  HGB 11.0*   < > 12.2* 11.4* 12.3* 12.9* 13.4  HCT 33.0*   < > 36.6* 33.6* 37.5* 37.5* 39.3  MCV 98.8   < > 99.2 97.7 98.7 96.9 96.1  PLT 131*   < > 135* 121* 129* 141* 144*   < > = values in this interval not displayed.   Basic Metabolic Panel: Recent Labs  Lab 03/02/20 0524 03/03/20 0535 03/04/20 0538 03/05/20 0524 03/06/20 0547  NA 146* 142 141 143 141  K 4.5 3.9 4.2 4.1 4.2  CL 115* 111 108 106 109  CO2 24 23 24  21* 18*  GLUCOSE 109* 100* 96 81 81  BUN 73* 65* 66* 67* 76*  CREATININE 3.03* 3.09* 3.13* 3.30* 3.54*  CALCIUM 8.3* 8.1* 8.2* 8.6* 8.5*  MG  --   --  2.5* 2.5* 2.4  PHOS  --   --  4.7* 5.1* 6.0*   GFR: Estimated Creatinine Clearance: 15.3 mL/min (A) (by C-G formula based on SCr of 3.54 mg/dL (H)). Liver Function Tests: Recent Labs  Lab 03/02/20 0524 03/03/20 0535 03/04/20 0538 03/05/20 0524 03/06/20 0547  AST 25 22 21 21 19   ALT 61* 47* 41 36 32  ALKPHOS 128* 117 124 119 114  BILITOT 0.5 0.4 0.8 0.8 0.7  PROT 4.2* 3.9* 4.1* 4.2* 4.2*  ALBUMIN 2.0* 1.8* 1.9* 1.9* 1.9*   No results for input(s): LIPASE, AMYLASE in the last 168 hours. No results for input(s): AMMONIA in the last 168 hours. Coagulation Profile: No results for input(s): INR, PROTIME in the last 168 hours. Cardiac Enzymes: No results for input(s): CKTOTAL, CKMB, CKMBINDEX, TROPONINI in the last 168 hours. BNP (last 3 results) Recent Labs    12/29/19 1639 02/13/20 1431  PROBNP 224.0* 84.0   HbA1C: No results for input(s): HGBA1C in the last 72 hours. CBG: No results for input(s): GLUCAP in the last 168 hours. Lipid Profile: No results for input(s): CHOL, HDL, LDLCALC, TRIG, CHOLHDL, LDLDIRECT in the last 72 hours. Thyroid Function Tests: No results for input(s): TSH, T4TOTAL, FREET4, T3FREE, THYROIDAB in the last 72 hours. Anemia Panel: No  results for input(s): VITAMINB12, FOLATE, FERRITIN, TIBC, IRON, RETICCTPCT in the last 72 hours. Sepsis Labs: No results for input(s): PROCALCITON, LATICACIDVEN in the last 168 hours.  Recent Results (from the past 240 hour(s))  SARS CORONAVIRUS 2 (TAT 6-24 HRS) Nasopharyngeal Nasopharyngeal Swab     Status: None   Collection Time: 02/27/20  6:32 PM   Specimen: Nasopharyngeal Swab  Result Value Ref Range Status   SARS Coronavirus 2 NEGATIVE NEGATIVE Final    Comment: (NOTE) SARS-CoV-2 target nucleic acids are NOT DETECTED. The SARS-CoV-2 RNA is generally detectable in upper and lower respiratory specimens during the acute phase of infection. Negative results do not preclude SARS-CoV-2 infection, do not rule out co-infections with other pathogens, and should not be used as the sole basis for treatment or other patient management decisions. Negative results must be combined with clinical observations, patient history, and epidemiological information. The expected result is Negative. Fact Sheet for Patients: SugarRoll.be Fact Sheet for Healthcare Providers: https://www.woods-mathews.com/ This test is not yet approved or cleared by the [place] FDA and  has been authorized for detection and/or diagnosis of SARS-CoV-2 by FDA under an Emergency Use Authorization (EUA). This EUA will remain  in effect (meaning this test can be used) for the duration of the COVID-19 declaration under Section 56 4(b)(1) of the Act, 21 U.S.C. section 360bbb-3(b)(1), unless the authorization is terminated or revoked sooner. Performed at [facility], [place] [address] [place], [place] [address]      RN Pressure Injury Documentation:     Estimated body mass index is 19.2 kg/m as calculated from the following:   Height as of this encounter: 6\' 1"  (1.854 m).   Weight as of this encounter: 66 kg.  Malnutrition Type:  Nutrition Problem: Severe  Malnutrition Etiology: acute illness(unknown etiology)   Malnutrition Characteristics:  Signs/Symptoms: moderate fat depletion, moderate muscle depletion, severe muscle depletion, percent weight loss Percent weight loss: 20 %   Nutrition Interventions:  Interventions: Ensure Enlive (each supplement provides 350kcal and 20 grams of protein), Magic cup, MVI   Radiology Studies: No results found. Scheduled Meds: . aspirin EC  81 mg Oral Daily  . feeding supplement  1 Container Oral TID BM  . feeding supplement (ENSURE ENLIVE)  237 mL Oral BID BM  . heparin  5,000 Units Subcutaneous Q8H  . multivitamin with minerals  1 tablet Oral Daily  . pantoprazole  40 mg Oral BID  . polyethylene glycol  17 g Oral BID  . senna-docusate  2 tablet Oral BID  . sertraline  25 mg Oral Daily  . sucralfate  1 g Oral  TID WC & HS  . tamsulosin  0.4 mg Oral QPC supper   Continuous Infusions: . sodium bicarbonate 150 mEq in dextrose 5% 1000 mL 75 mL/hr at 03/06/20 1800    LOS: 11 days   Kerney Elbe, DO [facility] PAGER is on [place]  If 7PM-7AM, please contact night-coverage [email or web address]

## 2020-03-07 DIAGNOSIS — F331 Major depressive disorder, recurrent, moderate: Principal | ICD-10-CM

## 2020-03-07 LAB — CBC WITH DIFFERENTIAL/PLATELET
Abs Immature Granulocytes: 0.02 10*3/uL (ref 0.00–0.07)
Basophils Absolute: 0 10*3/uL (ref 0.0–0.1)
Basophils Relative: 0 %
Eosinophils Absolute: 0 10*3/uL (ref 0.0–0.5)
Eosinophils Relative: 1 %
HCT: 32.7 % — ABNORMAL LOW (ref 39.0–52.0)
Hemoglobin: 11.1 g/dL — ABNORMAL LOW (ref 13.0–17.0)
Immature Granulocytes: 0 %
Lymphocytes Relative: 18 %
Lymphs Abs: 1.2 10*3/uL (ref 0.7–4.0)
MCH: 32.7 pg (ref 26.0–34.0)
MCHC: 33.9 g/dL (ref 30.0–36.0)
MCV: 96.5 fL (ref 80.0–100.0)
Monocytes Absolute: 0.5 10*3/uL (ref 0.1–1.0)
Monocytes Relative: 8 %
Neutro Abs: 4.7 10*3/uL (ref 1.7–7.7)
Neutrophils Relative %: 73 %
Platelets: 129 10*3/uL — ABNORMAL LOW (ref 150–400)
RBC: 3.39 MIL/uL — ABNORMAL LOW (ref 4.22–5.81)
RDW: 13.7 % (ref 11.5–15.5)
WBC: 6.4 10*3/uL (ref 4.0–10.5)
nRBC: 0 % (ref 0.0–0.2)

## 2020-03-07 LAB — COMPREHENSIVE METABOLIC PANEL
ALT: 28 U/L (ref 0–44)
AST: 25 U/L (ref 15–41)
Albumin: 1.6 g/dL — ABNORMAL LOW (ref 3.5–5.0)
Alkaline Phosphatase: 112 U/L (ref 38–126)
Anion gap: 9 (ref 5–15)
BUN: 83 mg/dL — ABNORMAL HIGH (ref 8–23)
CO2: 25 mmol/L (ref 22–32)
Calcium: 7.7 mg/dL — ABNORMAL LOW (ref 8.9–10.3)
Chloride: 106 mmol/L (ref 98–111)
Creatinine, Ser: 3.81 mg/dL — ABNORMAL HIGH (ref 0.61–1.24)
GFR calc Af Amer: 16 mL/min — ABNORMAL LOW (ref >60)
GFR calc non Af Amer: 14 mL/min — ABNORMAL LOW (ref >60)
Glucose, Bld: 138 mg/dL — ABNORMAL HIGH (ref 70–99)
Potassium: 3.5 mmol/L (ref 3.5–5.1)
Sodium: 140 mmol/L (ref 135–145)
Total Bilirubin: 0.4 mg/dL (ref 0.3–1.2)
Total Protein: 3.6 g/dL — ABNORMAL LOW (ref 6.5–8.1)

## 2020-03-07 LAB — PHOSPHORUS: Phosphorus: 5 mg/dL — ABNORMAL HIGH (ref 2.5–4.6)

## 2020-03-07 LAB — MAGNESIUM: Magnesium: 2.4 mg/dL (ref 1.7–2.4)

## 2020-03-07 MED ORDER — MIRTAZAPINE 15 MG PO TABS
15.0000 mg | ORAL_TABLET | Freq: Every day | ORAL | Status: DC
  Administered 2020-03-07 – 2020-03-17 (×10): 15 mg via ORAL
  Filled 2020-03-07 (×10): qty 1

## 2020-03-07 MED ORDER — BUPROPION HCL ER (XL) 150 MG PO TB24
150.0000 mg | ORAL_TABLET | Freq: Every day | ORAL | Status: DC
  Administered 2020-03-08 – 2020-03-18 (×10): 150 mg via ORAL
  Filled 2020-03-07 (×10): qty 1

## 2020-03-07 NOTE — Progress Notes (Addendum)
[facility] Progress Note  Subjective: pt's creat rising the last few days, has had 3 days of po lasix 40 qd, now stopped. Creat up to 3.81 today. Pt denies any N/V, does have poor appetite.  Brother at bedside, notes pt started to decline about 2 mos ago in February w/ increasing dependence on others.   Vitals:   03/06/20 1412 03/06/20 2138 03/07/20 0500 03/07/20 1353  BP: 125/73 125/75 (!) 103/59 119/72  Pulse: 71 67 74 (!) 108  Resp: '17 18 18 18  ' Temp: (!) 97.5 F (36.4 C) 97.8 F (36.6 C) 97.8 F (36.6 C) 97.7 F (36.5 C)  TempSrc: Oral Oral Oral Oral  SpO2: 99% 98% 99% 96%  Weight:      Height:        Exam: Gen elderly AAM, somewhat cachectic, calm No jvd or bruits Chest clear bilat to bases no rales RRR no MRG Abd soft ntnd no mass or ascites +bs GU normal male w/ condom cath  MS no joint effusions or deformity Ext 2+ bilat UE / LE /hip edema, no wounds or ulcers Neuro is alert, Ox 3 , nf    Home meds:  - furosemide 40 qd  - simvastatin 20 hs/ aspirin 81  - elavil 25 hs  - prilosec 40 qd/ flomax 0.4  - prn's/ vitamins/ supplements    UA 3/28 -  > 300 proteinuria, 0-5 rbc/ wbc   Urine prot-creat ratio 4/5 = 4.96        Date                          Creat               Protein             eGFR    2009- 16                   1.01- 1.13    2017- 19                   1.20- 1.26    2020                         1.44    Jan '21                      3.07 >> 1.88                            21 > 28    Feb '21                     1.84- 2.04        +100                    02/13/20                     3.24                                             02/22/20 > 02/29/20  3.15 >> 3.24    > 300               20  ml/min      Renal US 4/4 >   11.1/ 11.1 cm kidneys, ^'d bilat echotexture c/w chron medical disease, bilat cysts, no hydro     CXR 3/28 /21 > COPD, no active disease otherwise      ECHO Jan 2021 >  LVEF 60%, G1DD     Na 147  K 4.2 CO2 23  BUN 76  Cr  3.24  Alb 1.8  AST 31/ aLT 84  Tprot 4.0, Tbili 0.5  eGFR 20     WBC 5k  Hb 11.0   plt 131     MR Abd wo 3/31 - normal appearing kidneys w/ bilat cysts, no hydro     HIV, ANA, Hep B/C, ANCA's negative      C3, C4 wnl  Assessment/ Plan: 1. AoCKD 4 - baseline creat unclear as losing renal function since Jan 2021. Decline in renal fxn over last 1-2 years and continues to worsen over the last 4-5 mos.  +proteinuria neph range w/ low alb 1.8.  >2 month hx of decline per family w/ fatigue and poor po intake. +Depression per psych consult and pt is somewhat frail. US shows chronic changes.  HIV, ANA, Hep B/C, ANCA's negative.  C3, C4 wnl.  No indication for RRT at this time. Don't see any reversible issues.  PO lasix stopped 2 days ago. Pt is vol overloaded on exam. If renal fxn continues to worsen off of po lasix, consider IV lasix challenge vs consideration of dialysis vs conservative care. 2. BP/ volume - BP's slightly low to normal, off any BP lowering med 3. Nausea /vomiting - sp EGD w/ gastritis and esophagitis 4. Hypoalbuminemia - suspect neph syndrome + malnutrition 5. Depression - see pysch eval  6. FTT - last several mos     Rob Lucero Auzenne 03/07/2020, 3:50 PM   Recent Labs  Lab 03/06/20 0547 03/07/20 0548  K 4.2 3.5  BUN 76* 83*  CREATININE 3.54* 3.81*  CALCIUM 8.5* 7.7*  PHOS 6.0* 5.0*  HGB 13.4 11.1*   Inpatient medications: . aspirin EC  81 mg Oral Daily  . buPROPion  150 mg Oral Daily  . feeding supplement  1 Container Oral TID BM  . feeding supplement (ENSURE ENLIVE)  237 mL Oral BID BM  . heparin  5,000 Units Subcutaneous Q8H  . mirtazapine  15 mg Oral QHS  . multivitamin with minerals  1 tablet Oral Daily  . pantoprazole  40 mg Oral BID  . polyethylene glycol  17 g Oral BID  . senna-docusate  2 tablet Oral BID  . sucralfate  1 g Oral TID WC & HS  . tamsulosin  0.4 mg Oral QPC supper    ondansetron (ZOFRAN) IV, promethazine

## 2020-03-07 NOTE — Progress Notes (Signed)
PROGRESS NOTE    Alvin Ingram  ZTI:[id number] DOB: Apr 16, 1936 DOA: 02/22/2020 PCP: Binnie Rail, MD   Brief Narrative Alvin Ingram a 84 y.o.malewith medical history significant forBPHfollowed by urology, hyperlipidemia and GERD and other comorbidities who presented to [facility] ED as advised by his niecewith concerns fordecreased p.o. intake, suspected uncontrolled depression, weight loss,and generalizedweakness. Evaluated by his PCP about 2 weeksprior. Labsshowing mildly elevated LFTs (AST of 40 and ALT of 59). On Elavil for depression which can cause elevation in LFTs. ACT of his abdomen in February (01/02/20)showed extensive sigmoid diverticulosis and a15 mm hypodense lesion of the distal body of the pancreas that was indeterminate. MRI abdomen (02/25/20) showeda cystic lesion of the pancreas, with radiology recommendation for survillance in 2 years.   My colleagues spoke to the patient's niece via phone and she is concerned that he might be depressed, recently lost a friend. Admitted to feeling depressed without suicidal or homicidal ideation. Seen by psych with recommendation to DC Elavil and start Zoloft. Appetite remained poor. Evaluated by PT OT with recommendation for SNF with 24-hour assistance/supervision. Receptive to going to SNF.    GI was consulted for Nausea, Vomiting and Weight loss.  Had EGD done 4/5 showing esophagitis and hemorrhagic gastritis.  Biopsies pending.  Also seen by ENT due to concern for hoarseness in the setting of significant weight loss in a former smoker.  No significant findings.   03/03/20 he was feeling okay and complains that he does not really have nausea but when I saw him he did not really eat his breakfast because he was afraid of being nauseous.  Ate a little bit he states.  GI recommending continuing PPI twice daily.  PT OT recommending skilled nursing facility.    03/04/20 Unfortunately patient could not be discharged yesterday given  that his SNF authorization has expired and they are requesting updated PT and OT notes however patient been refusing to work with PT OT due to his fatigue.  This could potentially be a barrier to discharge given that he is improved from a GI standpoint and they have signed off.    03/05/20 Today the patient worked with physical therapy but then became orthostatic when standing we will give him some more fluid resuscitation and stop his Lasix and give him a 1 L fluid bolus as well as started on maintenance IV fluids at 75 MLS per hour.  We have ordered compression stockings and have the patient work with physical therapy again in the morning.  03/06/20 patient has had slightly worsening renal function and his fluids have now been adjusted and started on bicarbonate given that he has a metabolic acidosis.  Patient was hypotensive yesterday in the setting of his orthostasis and did not want to work with the nurse today to have orthostatics done today.  He wanted to sleep.  Remains depressed and will continue to monitor his renal function if is not improving will consult nephrology for further evaluations  03/07/2020 renal function continued to worsen so fluids were stopped and nephrology was reconsulted.  Dr. Jonnie Finner does not feel any indications for renal replacement therapy at this time and denies any reversible causes.  He feels that the patient continues to have worsening renal function off of p.o. Lasix IV Lasix challenge may be indicated versus dialysis versus conservative care.  Dr. Jonnie Finner was the patient has a component of nephrotic syndrome and it is worsened with malnutrition.  Assessment & Plan:   Principal Problem:   Major  depressive disorder, recurrent episode, moderate (HCC) Active Problems:   AKI (acute kidney injury) ([place])   Failure to thrive in adult   Transaminitis   Weakness   Hypernatremia   Protein-calorie malnutrition, severe  Failure to thrive in adult/ Severe weight loss/  Depression -Presented with poor oral intake, poor appetite,weight loss, uncontrolled depression -Elavil DCed and started on Zoloft 02/25/20 by Psychiatry and will continue -He had excessive sedation on Elavil and elevation in his LFTs -Encourage increase in oral protein calorie intake -Follow up with psych outpatient when he gets out of the hospital -See Below -Palliative care consulted and patient was not interested so of care recommending outpatient follow-up with palliative care at SNF -PT OT recommending SNF however he has been refusing to work with him but was able to work with physical therapist today and yesterday when he did became orthostatic -Do not want orthostatics done today given that he wanted to rest; fluids now stopped -Patient is very depressed and is not motivated and only wants to sleep whenever I see him; -Psychiatry reconsulted today and they have discontinued sertraline and adding Wellbutrin 150 mg p.o. daily for depression and increased motivation as well as adding mirtazapine 50 mg p.o. nightly for depression and insomnia and appetite stimulation  Esophagitis/Hemorrhagic gastritis -Management directed by GI as below -Continued IV PPI twice daily and Carafate and transitioned to po  -Continue with PPI and Carafate p.o.  Intractable nausea with transient vomiting, improved  -Continue antiemetics PRN -Continue recommendations per GI. -GI recommended ENT evaluation for chronic hoarseness and dysphagia with weight loss nausea and vomiting and patient has had chronic hoarseness with mild cough without symptoms of aspiration reflux laryngoscope was performed and showed normal bilateral vocal cord mobility without evidence of vocal cord nodule mass or hypopharynx -ENT believes the patient may have some vocal cord atrophy related to age changes and feels the swallowing issues may be related to chronic nausea and vomiting and GI issues and feels that there is no mucous lesion  upper hypopharynx or larynx -Continue to monitor and continue with antiemetics as necessary -Patient is no longer nauseous and will continue to encourage eating but nursing states that he is not been eating very much  AKI on CKD IV, worsening Metabolic Acidosis. mild Hyperphosphatemia -Presented with creatinine 3.24, proteuniuria -Baseline creatinine 2 with GFR 38 -BUN/creatinine remained relatively stable since admission but had worsened last 1 to 2 years is now acutely worsening and worsened from 3.03 and has gone to 83/3.81 -Patient had a CO2 of 21, anion gap of 16, chloride level of 106 and today his CO2 is 18, chloride level is 109, and anion gap was 14 -Continue to avoid nephrotoxins, hypotension, contrast dyes and renally adjust medications -Continue to monitor urine output -Follow recommendations per nephrology; nephrology feels that he may have some nephrotic syndrome and have sent off serologies for prognostic purposes but patient not a good candidate for immunosuppression given his age and felt in condition at this time and they are rescheduling his outpatient office visit for 3 to 4 weeks and then recommending p.o. Lasix daily for 7 to 10 days and then stopping; he got 3 days of p.o. Lasix Lasix but will stop given worsened Renal Fxn and significant orthostatic Hypotension -The day before yesterday was given a 1 L normal saline bolus and started on maintenance IV fluids with 75 MLS per hour however will change this to sodium bicarb and today it is stopped as he is volume overloaded -Nephrology  feels that he be remains volume overloaded on exam and his renal function continues to worsen off of p.o. Lasix that they recommend considering IV Lasix challenge versus consideration of dialysis versus conservative care -Repeat CMP within the a.m.  Acute Transaminitisin the setting of Elavil, improving and resolved -Possibly iatrogenic, Elavil can increase LFTs -LFTs are trending down and  have improved as AST is 25 and ALT is 28 -Right upper quadrant abdominal ultrasound 02/22/20 negative. No gallstones or wall thickening visualized. No sonographic Murphy sign noted by sonographer. -Acute hepatitis panel negative -Continue to avoid hepatotoxic agents  15 mm low attenuating lesion with fluid attenuation along the posterior distal body of the pancreas/B/L renal cysts -MRI shows cystic lesion of the pancreas, -Recommend surveillance withrepeat imagings in 2 years.  Resolved Hypovolemic hypernatremia with iV fluid -Presented with serum sodium 153 --> and improved to 140 -Improved on D5% at 50 cc/hr but will stop;  -He was started on Lasix this was stopped when his renal function started worsening and he became significantly orthostatic -Now IV fluids have been stopped -Continue to Monitor volume status but will stop IVF   Chronic Diastolic CHF -Last 2D echo done on 12/17/2019 showed LVEF 65-70% With grade 1 diastolic dysfunction -Initially held off lasix due to recent dehydration and AKI but this has been resumed and now will stop again -Was getting IVF Hydration at 50 mL/hr with D5W but will stop and start Lasix 40 mg po Daily for 7 Days at the recommendation of nephrology but this was stopped after his significant orthostatic hypotension and worsening renal function -We will watch off of IV fluids and he may need some IV Lasix per nephrology -Given a 1 L normal saline bolus and will start maintenance fluids at 75 MLS per hour and this was changed to sodium bicarbonate but is now stopped -Continue strict I's and O's and daily weight; patient is + 2.235 L since admission -Continue to monitor volume status extremely carefully  Right Pleural effusion on Renal US proved and resolved -Incidental finding -Monitor volume status on IV fluid and will stop IV fluid hydration and start po Lasix 40 mg Daily -Continue incentive spirometer -Maintain O2 sat>92% -Repeat chest x-ray  showed "Cardiac shadow is within normal limits. Aortic calcifications are again seen. The lungs are hyperinflated. No focal infiltrate or sizable effusion is seen. No bony abnormality is noted." -Continued with Lasix as above we will now stop and will give fluid resuscitation given orthostatic hypotension and this has been changed to sodium bicarbonate and now stopped today  Orthostatic Hypotension -Significant -Stopped  p.o. Lasix 3 days ago and give a 1 Liter Bolus and start Maintenance IVF at NS at 75 mL/hr maintenance fluid has not been changed to sodium bicarbonate at 75 mL's per hour but now stopped fluids altogether. -Continue to monitor orthostatic vital signs carefully  GERD -GI recommends pantoprazole 40 mg p.o. twice daily for 4 weeks followed by 40 mg p.o. once daily for another 4 weeks and also recommending Carafate for 2 weeks; Was changed to po  -Post EGD 03/01/2020 by Dr. Regina Eck with findings as stated above -GI recommending following up with Dr. Watt Climes or Primary GI doctor made off in 6 to 8 weeks after discharge  BPH -Continue Tamsulosin 0.4 mg po daily after supper -Follow up with Urology in the outpatient setting   Ambulatory dysfunction with frequent falls -Has had 3 falls at home within the last 2 weeks per his niece [name] in the bathroom  hospital 3/29, denies hitting his head -Denies feeling dizzy prior to falling -Lives alone, has not been able to appropriately take care of himself per his niece. -Continue PT OT with assistance and fall precautions. -TOC assisting with SNF placement as patient is agreeable however he is now recently refusing PT OT evaluation to SNF authorization is expired and they request updated PT notes; patient has been acutely orthostatic and did not want to try and repeat them today given his weakness  Avascular necrosis of the left femoral neck without acute fracture or cortical collapse from CT abd/pelvis wo contrast 01/02/20. -Fall  Precautions -Follow up as an outpatient and continue PT/OT efforts  Normocytic Anemia -Patient's hemoglobin/hematocit is now 11.1/32.7 -Continue to monitor for signs and symptoms of bleeding; currently no overt bleeding noted -Repeat CBC in a.m.  Thrombocytopenia -Mild but appears relatively stable for last few times it is checked and is 129,000 today  -Continue to monitor for signs and symptoms of bleeding; -Currently no overt bleeding noted -Repeat CBC in a.m.  DVT prophylaxis: Heparin 5,000 units sq q8h  Code Status: FULL CODE  Family Communication: No family present at bedside Disposition Plan: Patient is from home and now requires SNF level of care given his significant weakness and deconditioning.  GI is now signed off the case given that his symptomology is improved somewhat but he is severely depressed and has not been very motivated to work with therapies.  The day before yesterday became acutely orthostatic and his Lasix was stopped..  We will need close monitoring for his oral intake. A barrier to his discharge however is that his SNF authorization is expired and that he has been refusing to work with PT and OT recently suspect due to his depression and without updated PT notes he cannot safely be discharged to skilled nursing facility as he cannot be accepted despite him having a bed there.  When he did work with the physical therapist yesterday he is orthostatic in we will need to ensure there is no longer orthostatic prior to safe discharge.  Another complication is that his renal function is worsening and nephrology was reconsulted.  Patient continues to remain significantly depressed  Consultants:   Gastroenterology  Palliative Care Medicine  ENT  Nephrology; reconsulted  Psychiatry    Procedures:  EGD 4/5 Findings:      The Z-line was regular and was found 42 cm from the incisors.      LA Grade C (one or more mucosal breaks continuous between tops of 2 or        more mucosal folds, less than 75% circumference) esophagitis with no       bleeding was found 40 cm from the incisors. Biopsies were taken with a       cold forceps for histology.      Scattered severe inflammation with hemorrhage characterized by       congestion (edema), erosions, erythema and friability was found in the       gastric body and in the gastric antrum. Biopsies were taken with a cold       forceps for histology.      The cardia and gastric fundus were normal on retroflexion.      Extrinsic compression on the stomach was found in the gastric antrum.      The duodenal bulb, first portion of the duodenum and second portion of       the duodenum were normal. Impression:               -  Z-line regular, 42 cm from the incisors.                           - LA Grade C esophagitis with no bleeding. Biopsied.                           - Gastritis with hemorrhage. Biopsied.                           - Extrinsic compression in the gastric antrum.                           - Normal duodenal bulb, first portion of the                            duodenum and second portion of the duodenum.   Antimicrobials:  Anti-infectives (From admission, onward)   None     Subjective: Seen and examined and he remains withdrawn and wanting to lay in bed and rest.  States he felt okay but was extremely withdrawn and did not really want to participate.  Had some more leg swelling today as well as fluid was stopped.  Allergy was consulted again and so psychiatry due to his worsening depression and lack of motivation as well as worsening renal function.  Objective: Vitals:   03/06/20 1412 03/06/20 2138 03/07/20 0500 03/07/20 1353  BP: 125/73 125/75 (!) 103/59 119/72  Pulse: 71 67 74 (!) 108  Resp: 17 18 18 18   Temp: (!) 97.5 F (36.4 C) 97.8 F (36.6 C) 97.8 F (36.6 C) 97.7 F (36.5 C)  TempSrc: Oral Oral Oral Oral  SpO2: 99% 98% 99% 96%  Weight:      Height:        Intake/Output Summary  (Last 24 hours) at 03/07/2020 2013 Last data filed at 03/07/2020 1806 Gross per 24 hour  Intake 1129.73 ml  Output 600 ml  Net 529.73 ml   Filed Weights   03/01/20 0547 03/01/20 1204 03/02/20 0500  Weight: 66.2 kg 66.2 kg 66 kg   Examination: Physical Exam:  Constitutional: Thin elderly frail-appearing African-American male who is in no acute distress and is significantly depressed and withdrawn and wanting to sleep with the covers on him Eyes: Lids and conjunctivae normal, sclerae anicteric  ENMT: External Ears, Nose appear normal. Grossly normal hearing.  Neck: Appears normal, supple, no cervical masses, normal ROM, no appreciable thyromegaly: No JVD Respiratory: Diminished to auscultation bilaterally, no wheezing, rales, rhonchi or crackles. Normal respiratory effort and patient is not tachypenic. No accessory muscle use.  Unlabored breathing Cardiovascular: RRR, no murmurs / rubs / gallops. S1 and S2 auscultated.  1+ lower extremity edema  Abdomen: Soft, non-tender, non-distended. Bowel sounds positive.  GU: Deferred. Musculoskeletal: No clubbing / cyanosis of digits/nails. No joint deformity upper and lower extremities.  Skin: No rashes, lesions, ulcers on limited skin evaluation. No induration; Warm and dry.  Neurologic: CN 2-12 grossly intact with no focal deficits. Romberg sign and cerebellar reflexes not assessed.  Psychiatric: Normal judgment and insight.  He is awake and alert..  Depressed and withdrawn.  Data Reviewed: I have personally reviewed following labs and imaging studies  CBC: Recent Labs  Lab 03/03/20 0535 03/04/20 0538 03/05/20 0524 03/06/20 0547 03/07/20 0548  WBC 6.2 6.0  6.3 7.0 6.4  NEUTROABS  --  4.2 4.5 5.1 4.7  HGB 11.4* 12.3* 12.9* 13.4 11.1*  HCT 33.6* 37.5* 37.5* 39.3 32.7*  MCV 97.7 98.7 96.9 96.1 96.5  PLT 121* 129* 141* 144* 947*   Basic Metabolic Panel: Recent Labs  Lab 03/03/20 0535 03/04/20 0538 03/05/20 0524 03/06/20 0547  03/07/20 0548  NA 142 141 143 141 140  K 3.9 4.2 4.1 4.2 3.5  CL 111 108 106 109 106  CO2 23 24 21* 18* 25  GLUCOSE 100* 96 81 81 138*  BUN 65* 66* 67* 76* 83*  CREATININE 3.09* 3.13* 3.30* 3.54* 3.81*  CALCIUM 8.1* 8.2* 8.6* 8.5* 7.7*  MG  --  2.5* 2.5* 2.4 2.4  PHOS  --  4.7* 5.1* 6.0* 5.0*   GFR: Estimated Creatinine Clearance: 14.2 mL/min (A) (by C-G formula based on SCr of 3.81 mg/dL (H)). Liver Function Tests: Recent Labs  Lab 03/03/20 0535 03/04/20 0538 03/05/20 0524 03/06/20 0547 03/07/20 0548  AST 22 21 21 19 25   ALT 47* 41 36 32 28  ALKPHOS 117 124 119 114 112  BILITOT 0.4 0.8 0.8 0.7 0.4  PROT 3.9* 4.1* 4.2* 4.2* 3.6*  ALBUMIN 1.8* 1.9* 1.9* 1.9* 1.6*   No results for input(s): LIPASE, AMYLASE in the last 168 hours. No results for input(s): AMMONIA in the last 168 hours. Coagulation Profile: No results for input(s): INR, PROTIME in the last 168 hours. Cardiac Enzymes: No results for input(s): CKTOTAL, CKMB, CKMBINDEX, TROPONINI in the last 168 hours. BNP (last 3 results) Recent Labs    12/29/19 1639 02/13/20 1431  PROBNP 224.0* 84.0   HbA1C: No results for input(s): HGBA1C in the last 72 hours. CBG: No results for input(s): GLUCAP in the last 168 hours. Lipid Profile: No results for input(s): CHOL, HDL, LDLCALC, TRIG, CHOLHDL, LDLDIRECT in the last 72 hours. Thyroid Function Tests: No results for input(s): TSH, T4TOTAL, FREET4, T3FREE, THYROIDAB in the last 72 hours. Anemia Panel: No results for input(s): VITAMINB12, FOLATE, FERRITIN, TIBC, IRON, RETICCTPCT in the last 72 hours. Sepsis Labs: No results for input(s): PROCALCITON, LATICACIDVEN in the last 168 hours.  Recent Results (from the past 240 hour(s))  SARS CORONAVIRUS 2 (TAT 6-24 HRS) Nasopharyngeal Nasopharyngeal Swab     Status: None   Collection Time: 02/27/20  6:32 PM   Specimen: Nasopharyngeal Swab  Result Value Ref Range Status   SARS Coronavirus 2 NEGATIVE NEGATIVE Final     Comment: (NOTE) SARS-CoV-2 target nucleic acids are NOT DETECTED. The SARS-CoV-2 RNA is generally detectable in upper and lower respiratory specimens during the acute phase of infection. Negative results do not preclude SARS-CoV-2 infection, do not rule out co-infections with other pathogens, and should not be used as the sole basis for treatment or other patient management decisions. Negative results must be combined with clinical observations, patient history, and epidemiological information. The expected result is Negative. Fact Sheet for Patients: SugarRoll.be Fact Sheet for Healthcare Providers: https://www.woods-mathews.com/ This test is not yet approved or cleared by the [place] FDA and  has been authorized for detection and/or diagnosis of SARS-CoV-2 by FDA under an Emergency Use Authorization (EUA). This EUA will remain  in effect (meaning this test can be used) for the duration of the COVID-19 declaration under Section 56 4(b)(1) of the Act, 21 U.S.C. section 360bbb-3(b)(1), unless the authorization is terminated or revoked sooner. Performed at [facility], [place] [address] [place], [place] [address]      RN  Pressure Injury Documentation:     Estimated body mass index is 19.2 kg/m as calculated from the following:   Height as of this encounter: 6\' 1"  (1.854 m).   Weight as of this encounter: 66 kg.  Malnutrition Type:  Nutrition Problem: Severe Malnutrition Etiology: acute illness(unknown etiology)   Malnutrition Characteristics:  Signs/Symptoms: moderate fat depletion, moderate muscle depletion, severe muscle depletion, percent weight loss Percent weight loss: 20 %   Nutrition Interventions:  Interventions: Ensure Enlive (each supplement provides 350kcal and 20 grams of protein), Magic cup, MVI   Radiology Studies: No results found. Scheduled Meds: . aspirin EC  81 mg Oral Daily  . buPROPion  150 mg  Oral Daily  . feeding supplement  1 Container Oral TID BM  . feeding supplement (ENSURE ENLIVE)  237 mL Oral BID BM  . heparin  5,000 Units Subcutaneous Q8H  . mirtazapine  15 mg Oral QHS  . multivitamin with minerals  1 tablet Oral Daily  . pantoprazole  40 mg Oral BID  . polyethylene glycol  17 g Oral BID  . senna-docusate  2 tablet Oral BID  . sucralfate  1 g Oral TID WC & HS  . tamsulosin  0.4 mg Oral QPC supper   Continuous Infusions:   LOS: 12 days   Kerney Elbe, DO [facility] PAGER is on AMION  If 7PM-7AM, please contact night-coverage [email or web address]

## 2020-03-07 NOTE — Consult Note (Signed)
Telepsych Consultation   Reason for Consult:  ''depression and lack of motivation.'' Referring Physician:  Kerney Elbe, DO Location of Patient: [facility] Location of Provider: [facility]  Patient Identification: Alvin Ingram MRN:  [id number] Principal Diagnosis: Major depressive disorder, recurrent episode, moderate ([place]) Diagnosis:  Principal Problem:   Major depressive disorder, recurrent episode, moderate ([place]) Active Problems:   AKI (acute kidney injury) ([place])   Failure to thrive in adult   Transaminitis   Weakness   Hypernatremia   Protein-calorie malnutrition, severe   Total Time spent with patient: 45 minutes  Subjective:   Alvin Ingram is a 84 y.o. male patient admitted with decreased oral intake, weight loss and generalized weakness.  HPI: 84 y.o.malewith medical history significant forBPHfollowed by urology, hyperlipidemia and GERD who was admitted to the hospital due to decreased p.o. intake, uncontrolled depression, weight lossand generalizedweakness. Patient was assessed by psychiatric service on 02/25/20 and placed on 25 mg of Sertraline. However, patient continues to reports worsening depressive symptoms characterized by low energy level, poor appetite, lack of motivation/anhedonia, difficulty sleeping but denies self harming thoughts, delusions or psychosis.  Past Psychiatric History: as above  Risk to Self:  denies Risk to Others:  denies Prior Inpatient Therapy:  none reported Prior Outpatient Therapy:   PCP  Past Medical History:  Past Medical History:  Diagnosis Date  . Cardiac murmur    Aortic  . Diverticulosis 2006  . GERD (gastroesophageal reflux disease)   . History of colonic polyps 2003,2011   Dr Earlean Shawl  . History of syncope 1966   ? stress related  . Hyperlipidemia   . Hypertension    hypertensive response @ stress test    Past Surgical History:  Procedure Laterality Date  . BIOPSY  03/01/2020   Procedure: BIOPSY;   Surgeon: Otis Brace, MD;  Location: [facility];  Service: Gastroenterology;;  . CATARACT EXTRACTION  2003 & 2011   Dr Charise Killian  . COLONOSCOPY  04/2005   tics, no polyps. Dr.Medoff  . COLONOSCOPY W/ POLYPECTOMY  2003, 2011   neg 2006; due 2016. Dr Earlean Shawl  . ESOPHAGOGASTRODUODENOSCOPY (EGD) WITH PROPOFOL N/A 03/01/2020   Procedure: ESOPHAGOGASTRODUODENOSCOPY (EGD) WITH PROPOFOL;  Surgeon: Otis Brace, MD;  Location: [facility];  Service: Gastroenterology;  Laterality: N/A;   Family History:  Family History  Problem Relation Age of Onset  . Heart attack Mother 8  . Colon polyps Brother   . Pneumonia Brother   . Heart attack Brother 109  . Lung cancer Brother   . Other Father        Benign Brain Tumor  . Hypertension Brother   . Tuberculosis Paternal Uncle   . Other Maternal Grandmother   . Diabetes Neg Hx   . Stroke Neg Hx    Family Psychiatric  History:  Social History:  Social History   Substance and Sexual Activity  Alcohol Use Yes   Comment: RARELY     Social History   Substance and Sexual Activity  Drug Use No    Social History   Socioeconomic History  . Marital status: Single    Spouse name: Not on file  . Number of children: Not on file  . Years of education: Not on file  . Highest education level: Not on file  Occupational History  . Not on file  Tobacco Use  . Smoking status: Former Smoker    Quit date: 11/28/1967    Years since quitting: 52.3  . Smokeless tobacco: Never Used  .  Tobacco comment: smoked 1955-1969 , up to 1 ppd  Substance and Sexual Activity  . Alcohol use: Yes    Comment: RARELY  . Drug use: No  . Sexual activity: Not Currently  Other Topics Concern  . Not on file  Social History Narrative  . Not on file   Social Determinants of Health   Financial Resource Strain:   . Difficulty of Paying Living Expenses:   Food Insecurity:   . Worried About Charity fundraiser in the Last Year:   . Arboriculturist in the Last Year:    Transportation Needs:   . Film/video editor (Medical):   Marland Kitchen Lack of Transportation (Non-Medical):   Physical Activity:   . Days of Exercise per Week:   . Minutes of Exercise per Session:   Stress:   . Feeling of Stress :   Social Connections:   . Frequency of Communication with Friends and Family:   . Frequency of Social Gatherings with Friends and Family:   . Attends Religious Services:   . Active Member of Clubs or Organizations:   . Attends Archivist Meetings:   Marland Kitchen Marital Status:    Additional Social History:    Allergies:  No Known Allergies  Labs:  Results for orders placed or performed during the hospital encounter of 02/22/20 (from the past 48 hour(s))  Comprehensive metabolic panel     Status: Abnormal   Collection Time: 03/06/20  5:47 AM  Result Value Ref Range   Sodium 141 135 - 145 mmol/L   Potassium 4.2 3.5 - 5.1 mmol/L   Chloride 109 98 - 111 mmol/L   CO2 18 (L) 22 - 32 mmol/L   Glucose, Bld 81 70 - 99 mg/dL    Comment: Glucose reference range applies only to samples taken after fasting for at least 8 hours.   BUN 76 (H) 8 - 23 mg/dL   Creatinine, Ser 3.54 (H) 0.61 - 1.24 mg/dL   Calcium 8.5 (L) 8.9 - 10.3 mg/dL   Total Protein 4.2 (L) 6.5 - 8.1 g/dL   Albumin 1.9 (L) 3.5 - 5.0 g/dL   AST 19 15 - 41 U/L   ALT 32 0 - 44 U/L   Alkaline Phosphatase 114 38 - 126 U/L   Total Bilirubin 0.7 0.3 - 1.2 mg/dL   GFR calc non Af Amer 15 (L) >60 mL/min   GFR calc Af Amer 18 (L) >60 mL/min   Anion gap 14 5 - 15    Comment: Performed at [facility], [place] [address], [place], [place] [address]  CBC with Differential/Platelet     Status: Abnormal   Collection Time: 03/06/20  5:47 AM  Result Value Ref Range   WBC 7.0 4.0 - 10.5 K/uL   RBC 4.09 (L) 4.22 - 5.81 MIL/uL   Hemoglobin 13.4 13.0 - 17.0 g/dL   HCT 39.3 39.0 - 52.0 %   MCV 96.1 80.0 - 100.0 fL   MCH 32.8 26.0 - 34.0 pg   MCHC 34.1 30.0 - 36.0 g/dL   RDW 13.7 11.5 - 15.5 %    Platelets 144 (L) 150 - 400 K/uL    Comment: SPECIMEN CHECKED FOR CLOTS PLATELET COUNT CONFIRMED BY SMEAR REPEATED TO VERIFY    nRBC 0.0 0.0 - 0.2 %   Neutrophils Relative % 74 %   Neutro Abs 5.1 1.7 - 7.7 K/uL   Lymphocytes Relative 19 %   Lymphs Abs 1.3 0.7 - 4.0  K/uL   Monocytes Relative 7 %   Monocytes Absolute 0.5 0.1 - 1.0 K/uL   Eosinophils Relative 0 %   Eosinophils Absolute 0.0 0.0 - 0.5 K/uL   Basophils Relative 0 %   Basophils Absolute 0.0 0.0 - 0.1 K/uL   Immature Granulocytes 0 %   Abs Immature Granulocytes 0.02 0.00 - 0.07 K/uL    Comment: Performed at [facility], [place] [address], [place], [place] [address]  Magnesium     Status: None   Collection Time: 03/06/20  5:47 AM  Result Value Ref Range   Magnesium 2.4 1.7 - 2.4 mg/dL    Comment: Performed at [facility], [place] [address] [place], [place] [address]  Phosphorus     Status: Abnormal   Collection Time: 03/06/20  5:47 AM  Result Value Ref Range   Phosphorus 6.0 (H) 2.5 - 4.6 mg/dL    Comment: Performed at [facility], [place] [address], [place], [place] [address]  CBC with Differential/Platelet     Status: Abnormal   Collection Time: 03/07/20  5:48 AM  Result Value Ref Range   WBC 6.4 4.0 - 10.5 K/uL   RBC 3.39 (L) 4.22 - 5.81 MIL/uL   Hemoglobin 11.1 (L) 13.0 - 17.0 g/dL   HCT 32.7 (L) 39.0 - 52.0 %   MCV 96.5 80.0 - 100.0 fL   MCH 32.7 26.0 - 34.0 pg   MCHC 33.9 30.0 - 36.0 g/dL   RDW 13.7 11.5 - 15.5 %   Platelets 129 (L) 150 - 400 K/uL    Comment: REPEATED TO VERIFY   nRBC 0.0 0.0 - 0.2 %   Neutrophils Relative % 73 %   Neutro Abs 4.7 1.7 - 7.7 K/uL   Lymphocytes Relative 18 %   Lymphs Abs 1.2 0.7 - 4.0 K/uL   Monocytes Relative 8 %   Monocytes Absolute 0.5 0.1 - 1.0 K/uL   Eosinophils Relative 1 %   Eosinophils Absolute 0.0 0.0 - 0.5 K/uL   Basophils Relative 0 %   Basophils Absolute 0.0 0.0 - 0.1 K/uL   Immature Granulocytes 0 %    Abs Immature Granulocytes 0.02 0.00 - 0.07 K/uL    Comment: Performed at [facility], [place] [address], [place], [place] [address]  Comprehensive metabolic panel     Status: Abnormal   Collection Time: 03/07/20  5:48 AM  Result Value Ref Range   Sodium 140 135 - 145 mmol/L   Potassium 3.5 3.5 - 5.1 mmol/L   Chloride 106 98 - 111 mmol/L   CO2 25 22 - 32 mmol/L   Glucose, Bld 138 (H) 70 - 99 mg/dL    Comment: Glucose reference range applies only to samples taken after fasting for at least 8 hours.   BUN 83 (H) 8 - 23 mg/dL   Creatinine, Ser 3.81 (H) 0.61 - 1.24 mg/dL   Calcium 7.7 (L) 8.9 - 10.3 mg/dL   Total Protein 3.6 (L) 6.5 - 8.1 g/dL   Albumin 1.6 (L) 3.5 - 5.0 g/dL   AST 25 15 - 41 U/L   ALT 28 0 - 44 U/L   Alkaline Phosphatase 112 38 - 126 U/L   Total Bilirubin 0.4 0.3 - 1.2 mg/dL   GFR calc non Af Amer 14 (L) >60 mL/min   GFR calc Af Amer 16 (L) >60 mL/min   Anion gap 9 5 - 15    Comment: Performed at [facility],  [place] [address], [place], [place] [address]  Magnesium     Status: None   Collection Time: 03/07/20  5:48 AM  Result Value Ref Range   Magnesium 2.4 1.7 - 2.4 mg/dL    Comment: Performed at [facility], [place] [address], [place], [place] [address]  Phosphorus     Status: Abnormal   Collection Time: 03/07/20  5:48 AM  Result Value Ref Range   Phosphorus 5.0 (H) 2.5 - 4.6 mg/dL    Comment: Performed at [facility], [place] [address] [place], [place] [address]    Medications:  Current Facility-Administered Medications  Medication Dose Route Frequency Provider Last Rate Last Admin  . aspirin EC tablet 81 mg  81 mg Oral Daily Tu, Ching T, DO   81 mg at 03/06/20 1022  . buPROPion (WELLBUTRIN XL) 24 hr tablet 150 mg  150 mg Oral Daily Monserrate Blaschke, MD      . feeding supplement (BOOST / RESOURCE BREEZE) liquid 1 Container  1 Container Oral TID BM Kayleen Memos, DO   1 Container at 03/06/20  2150  . feeding supplement (ENSURE ENLIVE) (ENSURE ENLIVE) liquid 237 mL  237 mL Oral BID BM Hall, Carole N, DO   237 mL at 03/06/20 1029  . heparin injection 5,000 Units  5,000 Units Subcutaneous Q8H Tu, Ching T, DO   5,000 Units at 03/07/20 0558  . mirtazapine (REMERON) tablet 15 mg  15 mg Oral QHS Nguyet Mercer, MD      . multivitamin with minerals tablet 1 tablet  1 tablet Oral Daily Irene Pap N, DO   1 tablet at 03/06/20 1025  . ondansetron (ZOFRAN) injection 4 mg  4 mg Intravenous Q6H PRN Tu, Ching T, DO   4 mg at 03/05/20 2209  . pantoprazole (PROTONIX) EC tablet 40 mg  40 mg Oral BID Raiford Noble Callender, DO   40 mg at 03/06/20 2139  . polyethylene glycol (MIRALAX / GLYCOLAX) packet 17 g  17 g Oral BID Irene Pap N, DO   17 g at 03/05/20 2203  . promethazine (PHENERGAN) injection 12.5 mg  12.5 mg Intravenous Q6H PRN Irene Pap N, DO   12.5 mg at 02/29/20 0211  . senna-docusate (Senokot-S) tablet 2 tablet  2 tablet Oral BID Kayleen Memos, DO   2 tablet at 03/06/20 2139  . sucralfate (CARAFATE) 1 GM/10ML suspension 1 g  1 g Oral TID WC & HS Raiford Noble Wells, DO   1 g at 03/06/20 2139  . tamsulosin (FLOMAX) capsule 0.4 mg  0.4 mg Oral QPC supper Tu, Ching T, DO   0.4 mg at 03/06/20 6967    Musculoskeletal: Strength & Muscle Tone: not tested, patient assessed via [facility]: not tested, patient assessed via tele health Patient leans: N/A  Psychiatric Specialty Exam: Physical Exam  Psychiatric: Judgment and thought content normal. His speech is delayed. He is slowed and withdrawn. Cognition and memory are normal. He exhibits a depressed mood.    Review of Systems  Constitutional: Positive for activity change, appetite change and fatigue.  HENT: Negative.   Eyes: Negative.   Neurological: Positive for weakness.  Hematological: Negative.   Psychiatric/Behavioral: Positive for dysphoric mood and sleep disturbance.    Blood pressure (!) 103/59, pulse 74,  temperature 97.8 F (36.6 C), temperature source Oral, resp. rate 18, height 6\' 1"  (1.854 m), weight 66 kg, SpO2 99 %.Body mass index is 19.2 kg/m.  General Appearance:  Casual  Eye Contact:  Good  Speech:  Clear and Coherent  Volume:  Decreased  Mood:  Dysphoric  Affect:  Constricted  Thought Process:  Coherent  Orientation:  Full (Time, Place, and Person)  Thought Content:  Logical  Suicidal Thoughts:  No  Homicidal Thoughts:  No  Memory:  Immediate;   Fair Recent;   Fair Remote;   Fair  Judgement:  Intact  Insight:  Fair  Psychomotor Activity:  Psychomotor Retardation  Concentration:  Concentration: Good and Attention Span: Good  Recall:  [employer] of Knowledge:  Fair  Language:  Good  Akathisia:  No  Handed:  Right  AIMS (if indicated):     Assets:  Communication Skills Desire for Improvement  ADL's:  marginal  Cognition:  WNL  Sleep:   fair to poor     Treatment Plan Summary: 84 y.o.malewith medical history of BPH, hyperlipidemia and GERD who was admitted due to decreased p.o. intake, uncontrolled depression, weight lossand generalizedweakness. Patient is endorsing worsening depression but denies self harming thoughts, psychosis or delusions. Patient agreed to a trial of anti-depressant.  Recommendations:  -Discontinue Sertraline. -Add Wellbutrin XL 150 mg daily for depression and to increase motivation -Add Mirtazapine 15 mg at bedtime for depression/insomnia and appetite stimulation.  Disposition: No evidence of imminent risk to self or others at present.   Patient does not meet criteria for psychiatric inpatient admission. Supportive therapy provided about ongoing stressors. Psychistric service signing out. Re-consult as needed  This service was provided via telemedicine using a 2-way, interactive audio and video technology.  Names of all persons participating in this telemedicine service and their role in this encounter. Name: Alvin Ingram Role:  Patient  Name: Delora Fuel Role: RN  Name: Corena Pilgrim, MD Role: Psychiatrist    Corena Pilgrim, MD 03/07/2020 12:29 PM

## 2020-03-08 LAB — COMPREHENSIVE METABOLIC PANEL
ALT: 27 U/L (ref 0–44)
AST: 23 U/L (ref 15–41)
Albumin: 1.9 g/dL — ABNORMAL LOW (ref 3.5–5.0)
Alkaline Phosphatase: 115 U/L (ref 38–126)
Anion gap: 13 (ref 5–15)
BUN: 84 mg/dL — ABNORMAL HIGH (ref 8–23)
CO2: 27 mmol/L (ref 22–32)
Calcium: 7.8 mg/dL — ABNORMAL LOW (ref 8.9–10.3)
Chloride: 101 mmol/L (ref 98–111)
Creatinine, Ser: 3.49 mg/dL — ABNORMAL HIGH (ref 0.61–1.24)
GFR calc Af Amer: 18 mL/min — ABNORMAL LOW (ref >60)
GFR calc non Af Amer: 16 mL/min — ABNORMAL LOW (ref >60)
Glucose, Bld: 257 mg/dL — ABNORMAL HIGH (ref 70–99)
Potassium: 3.4 mmol/L — ABNORMAL LOW (ref 3.5–5.1)
Sodium: 141 mmol/L (ref 135–145)
Total Bilirubin: 0.3 mg/dL (ref 0.3–1.2)
Total Protein: 3.8 g/dL — ABNORMAL LOW (ref 6.5–8.1)

## 2020-03-08 LAB — CBC WITH DIFFERENTIAL/PLATELET
Abs Immature Granulocytes: 0.01 10*3/uL (ref 0.00–0.07)
Basophils Absolute: 0 10*3/uL (ref 0.0–0.1)
Basophils Relative: 0 %
Eosinophils Absolute: 0 10*3/uL (ref 0.0–0.5)
Eosinophils Relative: 1 %
HCT: 34.5 % — ABNORMAL LOW (ref 39.0–52.0)
Hemoglobin: 11.6 g/dL — ABNORMAL LOW (ref 13.0–17.0)
Immature Granulocytes: 0 %
Lymphocytes Relative: 17 %
Lymphs Abs: 1.1 10*3/uL (ref 0.7–4.0)
MCH: 32.6 pg (ref 26.0–34.0)
MCHC: 33.6 g/dL (ref 30.0–36.0)
MCV: 96.9 fL (ref 80.0–100.0)
Monocytes Absolute: 0.5 10*3/uL (ref 0.1–1.0)
Monocytes Relative: 8 %
Neutro Abs: 4.8 10*3/uL (ref 1.7–7.7)
Neutrophils Relative %: 74 %
Platelets: 134 10*3/uL — ABNORMAL LOW (ref 150–400)
RBC: 3.56 MIL/uL — ABNORMAL LOW (ref 4.22–5.81)
RDW: 13.8 % (ref 11.5–15.5)
WBC: 6.4 10*3/uL (ref 4.0–10.5)
nRBC: 0 % (ref 0.0–0.2)

## 2020-03-08 LAB — PSA: Prostatic Specific Antigen: 1.89 ng/mL (ref 0.00–4.00)

## 2020-03-08 LAB — CK: Total CK: 38 U/L — ABNORMAL LOW (ref 49–397)

## 2020-03-08 LAB — MAGNESIUM: Magnesium: 2.6 mg/dL — ABNORMAL HIGH (ref 1.7–2.4)

## 2020-03-08 LAB — PHOSPHORUS: Phosphorus: 4.6 mg/dL (ref 2.5–4.6)

## 2020-03-08 MED ORDER — POTASSIUM CHLORIDE CRYS ER 20 MEQ PO TBCR
40.0000 meq | EXTENDED_RELEASE_TABLET | Freq: Once | ORAL | Status: AC
  Administered 2020-03-08: 40 meq via ORAL
  Filled 2020-03-08: qty 2

## 2020-03-08 NOTE — Care Management Important Message (Signed)
Important Message  Patient Details IM Letter given to Marney Doctor RN Case Manager to present to the Patient Name: Latravis Grine MRN: [id number] Date of Birth: [birth year]   Medicare Important Message Given:  Yes     [name] 03/08/2020, 10:44 AM

## 2020-03-08 NOTE — Progress Notes (Addendum)
Patient not taking in much PO.  Night nurse informed. He is to start 24 urine collection tonight. Bottle in the room.

## 2020-03-08 NOTE — Progress Notes (Signed)
PROGRESS NOTE    Alvin Ingram  PYP:[id number] DOB: September 17, 1936 DOA: 02/22/2020 PCP: Binnie Rail, MD   Brief Narrative Alvin Ingram a 84 y.o.malewith medical history significant forBPHfollowed by urology, hyperlipidemia and GERD and other comorbidities who presented to [facility] ED as advised by his niecewith concerns fordecreased p.o. intake, suspected uncontrolled depression, weight loss,and generalizedweakness. Evaluated by his PCP about 2 weeksprior. Labsshowing mildly elevated LFTs (AST of 40 and ALT of 59). On Elavil for depression which can cause elevation in LFTs. ACT of his abdomen in February (01/02/20)showed extensive sigmoid diverticulosis and a15 mm hypodense lesion of the distal body of the pancreas that was indeterminate. MRI abdomen (02/25/20) showeda cystic lesion of the pancreas, with radiology recommendation for survillance in 2 years.   My colleagues spoke to the patient's niece via phone and she is concerned that he might be depressed, recently lost a friend. Admitted to feeling depressed without suicidal or homicidal ideation. Seen by psych with recommendation to DC Elavil and start Zoloft. Appetite remained poor. Evaluated by PT OT with recommendation for SNF with 24-hour assistance/supervision. Receptive to going to SNF.    GI was consulted for Nausea, Vomiting and Weight loss.  Had EGD done 4/5 showing esophagitis and hemorrhagic gastritis.  Biopsies pending.  Also seen by ENT due to concern for hoarseness in the setting of significant weight loss in a former smoker.  No significant findings.   03/03/20 he was feeling okay and complains that he does not really have nausea but when I saw him he did not really eat his breakfast because he was afraid of being nauseous.  Ate a little bit he states.  GI recommending continuing PPI twice daily.  PT OT recommending skilled nursing facility.    03/04/20 Unfortunately patient could not be discharged yesterday given  that his SNF authorization has expired and they are requesting updated PT and OT notes however patient been refusing to work with PT OT due to his fatigue.  This could potentially be a barrier to discharge given that he is improved from a GI standpoint and they have signed off.    03/05/20 Today the patient worked with physical therapy but then became orthostatic when standing we will give him some more fluid resuscitation and stop his Lasix and give him a 1 L fluid bolus as well as started on maintenance IV fluids at 75 MLS per hour.  We have ordered compression stockings and have the patient work with physical therapy again in the morning.  03/06/20 patient has had slightly worsening renal function and his fluids have now been adjusted and started on bicarbonate given that he has a metabolic acidosis.  Patient was hypotensive yesterday in the setting of his orthostasis and did not want to work with the nurse today to have orthostatics done today.  He wanted to sleep.  Remains depressed and will continue to monitor his renal function if is not improving will consult nephrology for further evaluations  03/07/2020 renal function continued to worsen so fluids were stopped and nephrology was reconsulted.  Dr. Jonnie Finner does not feel any indications for renal replacement therapy at this time and denies any reversible causes.  He feels that the patient continues to have worsening renal function off of p.o. Lasix IV Lasix challenge may be indicated versus dialysis versus conservative care.  Dr. Jonnie Finner was the patient has a component of nephrotic syndrome and it is worsened with malnutrition.  03/08/2020 patient was still withdrawn and wanting to rest.  He is complaining of some nausea and some reflux but no dizziness today.  Evaluated him and they are going to check an SPEP and a UPEP and 24-hour urine for his protein and if it is consistent with nephrotic syndrome they are going to consider a renal biopsy and the  discussion about renal replacement therapy was brought up by Dr. Marval Regal patient is not sure if he would be interested.  Assessment & Plan:   Principal Problem:   Major depressive disorder, recurrent episode, moderate (HCC) Active Problems:   AKI (acute kidney injury) ([place])   Failure to thrive in adult   Transaminitis   Weakness   Hypernatremia   Protein-calorie malnutrition, severe  Failure to thrive in adult/ Severe weight loss/ Depression -Presented with poor oral intake, poor appetite,weight loss, uncontrolled depression -Elavil DCed and started on Zoloft 02/25/20 by Psychiatry and will continue -He had excessive sedation on Elavil and elevation in his LFTs -Encourage increase in oral protein calorie intake -Follow up with psych outpatient when he gets out of the hospital -See Below -Palliative care consulted and patient was not interested so of care recommending outpatient follow-up with palliative care at SNF -PT OT recommending SNF however he has been refusing to work with him but was able to work with physical therapist today and yesterday when he did became orthostatic -Do not want orthostatics done today given that he wanted to rest; fluids now stopped -Patient is very depressed and is not motivated and only wants to sleep whenever I see him; -Psychiatry reconsulted today and they have discontinued sertraline and adding Wellbutrin 150 mg p.o. daily for depression and increased motivation as well as adding mirtazapine 50 mg p.o. nightly for depression and insomnia and appetite stimulation -She remains significantly withdrawn and depressed and states that he was nauseous a little bit today and complaining of reflux  Esophagitis/Hemorrhagic gastritis -Management directed by GI as below -Continued IV PPI twice daily and Carafate and transitioned to po  -Continue with PPI and Carafate p.o. given his esophagitis  Intractable nausea with transient vomiting, improved    -Continue antiemetics PRN -Continue recommendations per GI. -GI recommended ENT evaluation for chronic hoarseness and dysphagia with weight loss nausea and vomiting and patient has had chronic hoarseness with mild cough without symptoms of aspiration reflux laryngoscope was performed and showed normal bilateral vocal cord mobility without evidence of vocal cord nodule mass or hypopharynx -ENT believes the patient may have some vocal cord atrophy related to age changes and feels the swallowing issues may be related to chronic nausea and vomiting and GI issues and feels that there is no mucous lesion upper hypopharynx or larynx -Continue to monitor and continue with antiemetics as necessary -Patient is no longer nauseous and will continue to encourage eating but nursing states that he is not been eating very much -Continue to encourage oral intake  AKI on CKD IV, worsening Metabolic Acidosis. mild Hyperphosphatemia -Presented with creatinine 3.24, proteuniuria -Baseline creatinine 2 with GFR 38 -BUN/creatinine remained relatively stable since admission but had worsened last 1 to 2 years is now acutely worsening and worsened from 3.03 and has gone to 83/3.81; now slowly improving with a BUN/creatinine of 84/3.49 -Patient had a CO2 of 21, anion gap of 16, chloride level of 106 and today his CO2 is 27, chloride level is 101, and anion gap was 13 -Continue to avoid nephrotoxins, hypotension, contrast dyes and renally adjust medications -Continue to monitor urine output -Follow recommendations per nephrology; nephrology feels that  he may have some nephrotic syndrome and have sent off serologies for prognostic purposes but patient not a good candidate for immunosuppression given his age and felt in condition at this time and they are rescheduling his outpatient office visit for 3 to 4 weeks and then recommending p.o. Lasix daily for 7 to 10 days and then stopping; he got 3 days of p.o. Lasix Lasix but  will stop given worsened Renal Fxn and significant orthostatic Hypotension -IV fluid hydration is now stopped as patient remains volume overloaded -CK was 75 -Nephrology feels that he be remains volume overloaded on exam and his renal function continues to worsen off of p.o. Lasix that they recommend considering IV Lasix challenge versus consideration of dialysis versus conservative care -Nephrology is checking SPEP/UPEP and a 24-hour urine for protein and if this is consistent with nephrotic syndrome they are considering renal biopsy; they have also talked to the patient about renal replacement therapy including hemodialysis versus peritoneal dialysis and is not sure if you be interested -Repeat CMP within the a.m.  Acute Transaminitisin the setting of Elavil, improving and resolved -Possibly iatrogenic, Elavil can increase LFTs -LFTs are trending down and have improved as AST is 23 and ALT is 27 -Right upper quadrant abdominal ultrasound 02/22/20 negative. No gallstones or wall thickening visualized. No sonographic Murphy sign noted by sonographer. -Acute hepatitis panel negative -Continue to avoid hepatotoxic agents  15 mm low attenuating lesion with fluid attenuation along the posterior distal body of the pancreas/B/L renal cysts -MRI shows cystic lesion of the pancreas, -Recommend surveillance withrepeat imagings in 2 years.  Resolved Hypovolemic hypernatremia with iV fluid -Presented with serum sodium 153 --> and improved to 141 -Improved on D5% at 50 cc/hr but will stop;  -He was started on Lasix this was stopped when his renal function started worsening and he became significantly orthostatic -Now IV fluids have been stopped -Continue to Monitor volume status but will stop IVF   Chronic Diastolic CHF -Last 2D echo done on 12/17/2019 showed LVEF 65-70% With grade 1 diastolic dysfunction -Initially held off lasix due to recent dehydration and AKI but this has been resumed and  now will stop again -Was getting IVF Hydration at 50 mL/hr with D5W but will stop and start Lasix 40 mg po Daily for 7 Days at the recommendation of nephrology but this was stopped after his significant orthostatic hypotension and worsening renal function -We will watch off of IV fluids and he may need some IV Lasix per nephrology -Given a 1 L normal saline bolus and will start maintenance fluids at 75 MLS per hour and this was changed to sodium bicarbonate but is now stopped -Continue strict I's and O's and daily weight; patient is + 1.835 L since admission -Continue to monitor volume status extremely carefully  Right Pleural effusion on Renal US proved and resolved -Incidental finding -Monitor volume status on IV fluid and will stop IV fluid hydration and start po Lasix 40 mg Daily -Continue incentive spirometer -Maintain O2 sat>92% -Repeat chest x-ray showed "Cardiac shadow is within normal limits. Aortic calcifications are again seen. The lungs are hyperinflated. No focal infiltrate or sizable effusion is seen. No bony abnormality is noted." -Lasix stopped and shows fluids -Repeat chest x-ray in a.m.  Orthostatic Hypotension -Significant  -Stopped  p.o. Lasix 3 days ago and give a 1 Liter Bolus and start Maintenance IVF at NS at 75 mL/hr maintenance fluid has not been changed to sodium bicarbonate at 75 mL's per hour  but now stopped fluids altogether given his continued volume overload -Patient is too withdrawn to -Continue to monitor orthostatic vital signs carefully  GERD -GI recommends pantoprazole 40 mg p.o. twice daily for 4 weeks followed by 40 mg p.o. once daily for another 4 weeks and also recommending Carafate for 2 weeks; Was changed to po  -Post EGD 03/01/2020 by Dr. Regina Eck with findings as stated above -GI recommending following up with Dr. Watt Climes or Primary GI doctor made off in 6 to 8 weeks after discharge   BPH -Continue Tamsulosin 0.4 mg po daily after  supper -Follow up with Urology in the outpatient setting   Ambulatory dysfunction with frequent falls -Has had 3 falls at home within the last 2 weeks per his niece -[name] in the bathroom hospital 3/29, denies hitting his head -Denies feeling dizzy prior to falling -Lives alone, has not been able to appropriately take care of himself per his niece. -Continue PT OT with assistance and fall precautions. -TOC assisting with SNF placement as patient is agreeable however he is now recently refusing PT OT evaluation to SNF authorization is expired and they request updated PT notes; patient has been acutely orthostatic and did not want to try and repeat them today given his weakness  Avascular necrosis of the left femoral neck without acute fracture or cortical collapse from CT abd/pelvis wo contrast 01/02/20. -Fall Precautions -Follow up as an outpatient and continue PT/OT efforts  Normocytic Anemia -Patient's hemoglobin/hematocit is now 11.6/34.5 -Continue to monitor for signs and symptoms of bleeding; currently no overt bleeding noted -Repeat CBC in a.m.  Thrombocytopenia -Mild but appears relatively stable for last few times it is checked and is 134,000 today  -Continue to monitor for signs and symptoms of bleeding; -Currently no overt bleeding noted -Repeat CBC in a.m.  DVT prophylaxis: Heparin 5,000 units sq q8h  Code Status: FULL CODE  Family Communication: No family present at bedside Disposition Plan: Patient is from home and now requires SNF level of care given his significant weakness and deconditioning.  GI is now signed off the case given that his symptomology is improved somewhat but he is severely depressed and has not been very motivated to work with therapies.  During the course of his hospitalization became acutely orthostatic and his Lasix was stopped..  We will need close monitoring for his oral intake. A barrier to his discharge however is that his SNF authorization is  expired and that he has been refusing to work with PT and OT recently suspect due to his depression and without updated PT notes he cannot safely be discharged to skilled nursing facility as he cannot be accepted despite him having a bed there.  When he did work with the physical therapist he is orthostatic in we will need to ensure there is no longer orthostatic prior to safe discharge.  Another complication is that his renal function is worsening and nephrology was reconsulted and nephrology feels that he may have a component of nephrotic syndrome and malnutrition.  Patient continues to remain significantly depressed  Consultants:   Gastroenterology  Palliative Care Medicine  ENT  Nephrology; reconsulted  Psychiatry    Procedures:  EGD 4/5 Findings:      The Z-line was regular and was found 42 cm from the incisors.      LA Grade C (one or more mucosal breaks continuous between tops of 2 or       more mucosal folds, less than 75% circumference) esophagitis with no  bleeding was found 40 cm from the incisors. Biopsies were taken with a       cold forceps for histology.      Scattered severe inflammation with hemorrhage characterized by       congestion (edema), erosions, erythema and friability was found in the       gastric body and in the gastric antrum. Biopsies were taken with a cold       forceps for histology.      The cardia and gastric fundus were normal on retroflexion.      Extrinsic compression on the stomach was found in the gastric antrum.      The duodenal bulb, first portion of the duodenum and second portion of       the duodenum were normal. Impression:               - Z-line regular, 42 cm from the incisors.                           - LA Grade C esophagitis with no bleeding. Biopsied.                           - Gastritis with hemorrhage. Biopsied.                           - Extrinsic compression in the gastric antrum.                           - Normal  duodenal bulb, first portion of the                            duodenum and second portion of the duodenum.   Antimicrobials:  Anti-infectives (From admission, onward)   None     Subjective: Seen and examined and he remains withdrawn and wanting to lay in bed and rest.  States he felt okay but was extremely withdrawn and did not really want to participate.  Had some more leg swelling today as well as fluid was stopped.  Allergy was consulted again and so psychiatry due to his worsening depression and lack of motivation as well as worsening renal function.  Objective: Vitals:   03/07/20 1353 03/07/20 2122 03/08/20 0601 03/08/20 1412  BP: 119/72 115/70 108/66 111/61  Pulse: (!) 108 70 81 72  Resp: 18 16 16 19   Temp: 97.7 F (36.5 C) (!) 97.4 F (36.3 C) (!) 97.4 F (36.3 C) 97.8 F (36.6 C)  TempSrc: Oral Oral Oral Oral  SpO2: 96% 98% 94% 98%  Weight:      Height:        Intake/Output Summary (Last 24 hours) at 03/08/2020 1730 Last data filed at 03/08/2020 0152 Gross per 24 hour  Intake --  Output 750 ml  Net -750 ml   Filed Weights   03/01/20 0547 03/01/20 1204 03/02/20 0500  Weight: 66.2 kg 66.2 kg 66 kg   Examination: Physical Exam:  Constitutional: Thin elderly frail-appearing African-American male who is extremely depressed and withdrawn and laying in bed monitor rest again Eyes: Lids and conjunctivae normal, sclerae anicteric  ENMT: External Ears, Nose appear normal. Grossly normal hearing.  Neck: Appears normal, supple, no cervical masses, normal ROM, no appreciable thyromegaly, no JVD Respiratory: Diminished to auscultation bilaterally,  no wheezing, rales, rhonchi or crackles. Normal respiratory effort and patient is not tachypenic. No accessory muscle use.  Unlabored breathing Cardiovascular: RRR, no murmurs / rubs / gallops. S1 and S2 auscultated.  Has 1+ lower extremity edema Abdomen: Soft, non-tender, non-distended. Bowel sounds positive.  GU:  Deferred. Musculoskeletal: No clubbing / cyanosis of digits/nails. No joint deformity upper and lower extremities.  Skin: No rashes, lesions, ulcers or limited skin evaluation. No induration; Warm and dry.  Neurologic: CN 2-12 grossly intact with no focal deficits. Romberg sign and cerebellar reflexes not assessed.  Psychiatric: Normal judgment and insight.  Monitor rest and is extremely depressed and withdrawn and has a very flat affect  Data Reviewed: I have personally reviewed following labs and imaging studies  CBC: Recent Labs  Lab 03/04/20 0538 03/05/20 0524 03/06/20 0547 03/07/20 0548 03/08/20 0521  WBC 6.0 6.3 7.0 6.4 6.4  NEUTROABS 4.2 4.5 5.1 4.7 4.8  HGB 12.3* 12.9* 13.4 11.1* 11.6*  HCT 37.5* 37.5* 39.3 32.7* 34.5*  MCV 98.7 96.9 96.1 96.5 96.9  PLT 129* 141* 144* 129* 510*   Basic Metabolic Panel: Recent Labs  Lab 03/04/20 0538 03/05/20 0524 03/06/20 0547 03/07/20 0548 03/08/20 0521  NA 141 143 141 140 141  K 4.2 4.1 4.2 3.5 3.4*  CL 108 106 109 106 101  CO2 24 21* 18* 25 27  GLUCOSE 96 81 81 138* 257*  BUN 66* 67* 76* 83* 84*  CREATININE 3.13* 3.30* 3.54* 3.81* 3.49*  CALCIUM 8.2* 8.6* 8.5* 7.7* 7.8*  MG 2.5* 2.5* 2.4 2.4 2.6*  PHOS 4.7* 5.1* 6.0* 5.0* 4.6   GFR: Estimated Creatinine Clearance: 15.5 mL/min (A) (by C-G formula based on SCr of 3.49 mg/dL (H)). Liver Function Tests: Recent Labs  Lab 03/04/20 0538 03/05/20 0524 03/06/20 0547 03/07/20 0548 03/08/20 0521  AST 21 21 19 25 23   ALT 41 36 32 28 27  ALKPHOS 124 119 114 112 115  BILITOT 0.8 0.8 0.7 0.4 0.3  PROT 4.1* 4.2* 4.2* 3.6* 3.8*  ALBUMIN 1.9* 1.9* 1.9* 1.6* 1.9*   No results for input(s): LIPASE, AMYLASE in the last 168 hours. No results for input(s): AMMONIA in the last 168 hours. Coagulation Profile: No results for input(s): INR, PROTIME in the last 168 hours. Cardiac Enzymes: Recent Labs  Lab 03/08/20 1402  CKTOTAL 38*   BNP (last 3 results) Recent Labs     12/29/19 1639 02/13/20 1431  PROBNP 224.0* 84.0   HbA1C: No results for input(s): HGBA1C in the last 72 hours. CBG: No results for input(s): GLUCAP in the last 168 hours. Lipid Profile: No results for input(s): CHOL, HDL, LDLCALC, TRIG, CHOLHDL, LDLDIRECT in the last 72 hours. Thyroid Function Tests: No results for input(s): TSH, T4TOTAL, FREET4, T3FREE, THYROIDAB in the last 72 hours. Anemia Panel: No results for input(s): VITAMINB12, FOLATE, FERRITIN, TIBC, IRON, RETICCTPCT in the last 72 hours. Sepsis Labs: No results for input(s): PROCALCITON, LATICACIDVEN in the last 168 hours.  Recent Results (from the past 240 hour(s))  SARS CORONAVIRUS 2 (TAT 6-24 HRS) Nasopharyngeal Nasopharyngeal Swab     Status: None   Collection Time: 02/27/20  6:32 PM   Specimen: Nasopharyngeal Swab  Result Value Ref Range Status   SARS Coronavirus 2 NEGATIVE NEGATIVE Final    Comment: (NOTE) SARS-CoV-2 target nucleic acids are NOT DETECTED. The SARS-CoV-2 RNA is generally detectable in upper and lower respiratory specimens during the acute phase of infection. Negative results do not preclude SARS-CoV-2 infection, do not rule  out co-infections with other pathogens, and should not be used as the sole basis for treatment or other patient management decisions. Negative results must be combined with clinical observations, patient history, and epidemiological information. The expected result is Negative. Fact Sheet for Patients: SugarRoll.be Fact Sheet for Healthcare Providers: https://www.woods-mathews.com/ This test is not yet approved or cleared by the [place] FDA and  has been authorized for detection and/or diagnosis of SARS-CoV-2 by FDA under an Emergency Use Authorization (EUA). This EUA will remain  in effect (meaning this test can be used) for the duration of the COVID-19 declaration under Section 56 4(b)(1) of the Act, 21 U.S.C. section  360bbb-3(b)(1), unless the authorization is terminated or revoked sooner. Performed at [facility], [place] [address], [place], [place] [address]      RN Pressure Injury Documentation:     Estimated body mass index is 19.2 kg/m as calculated from the following:   Height as of this encounter: 6\' 1"  (1.854 m).   Weight as of this encounter: 66 kg.  Malnutrition Type:  Nutrition Problem: Severe Malnutrition Etiology: acute illness(unknown etiology)   Malnutrition Characteristics:  Signs/Symptoms: moderate fat depletion, moderate muscle depletion, severe muscle depletion, percent weight loss Percent weight loss: 20 %   Nutrition Interventions:  Interventions: Ensure Enlive (each supplement provides 350kcal and 20 grams of protein), Magic cup, MVI   Radiology Studies: No results found. Scheduled Meds: . aspirin EC  81 mg Oral Daily  . buPROPion  150 mg Oral Daily  . feeding supplement  1 Container Oral TID BM  . feeding supplement (ENSURE ENLIVE)  237 mL Oral BID BM  . heparin  5,000 Units Subcutaneous Q8H  . mirtazapine  15 mg Oral QHS  . multivitamin with minerals  1 tablet Oral Daily  . pantoprazole  40 mg Oral BID  . polyethylene glycol  17 g Oral BID  . senna-docusate  2 tablet Oral BID  . sucralfate  1 g Oral TID WC & HS  . tamsulosin  0.4 mg Oral QPC supper   Continuous Infusions:   LOS: 13 days   Kerney Elbe, DO [facility] PAGER is on AMION  If 7PM-7AM, please contact night-coverage [email or web address]

## 2020-03-08 NOTE — Progress Notes (Addendum)
Patient ID: Alvin Ingram, male   DOB: 1936-01-11, 84 y.o.   MRN: [id number] S: complaining of acid reflux and some nausea. O:BP 108/66 (BP Location: Left Arm)   Pulse 81   Temp (!) 97.4 F (36.3 C) (Oral)   Resp 16   Ht 6\' 1"  (1.854 m)   Wt 66 kg   SpO2 94%   BMI 19.20 kg/m   Intake/Output Summary (Last 24 hours) at 03/08/2020 1316 Last data filed at 03/08/2020 0152 Gross per 24 hour  Intake --  Output 750 ml  Net -750 ml   Intake/Output: I/O last 3 completed shifts: In: 1129.7 [P.O.:237; I.V.:892.7] Out: 1000 [Urine:1000]  Intake/Output this shift:  No intake/output data recorded. Weight change:  Gen: cachectic male lying in bed, flat affect, no apparent distress CVS: no rub Resp: cta Abd: +BS, soft, NT/ND Ext: 3+ lower extremity edema bilaterally  Recent Labs  Lab 03/02/20 0524 03/03/20 0535 03/04/20 0538 03/05/20 0524 03/06/20 0547 03/07/20 0548 03/08/20 0521  NA 146* 142 141 143 141 140 141  K 4.5 3.9 4.2 4.1 4.2 3.5 3.4*  CL 115* 111 108 106 109 106 101  CO2 24 23 24  21* 18* 25 27  GLUCOSE 109* 100* 96 81 81 138* 257*  BUN 73* 65* 66* 67* 76* 83* 84*  CREATININE 3.03* 3.09* 3.13* 3.30* 3.54* 3.81* 3.49*  ALBUMIN 2.0* 1.8* 1.9* 1.9* 1.9* 1.6* 1.9*  CALCIUM 8.3* 8.1* 8.2* 8.6* 8.5* 7.7* 7.8*  PHOS  --   --  4.7* 5.1* 6.0* 5.0* 4.6  AST 25 22 21 21 19 25 23   ALT 61* 47* 41 36 32 28 27   Liver Function Tests: Recent Labs  Lab 03/06/20 0547 03/07/20 0548 03/08/20 0521  AST 19 25 23   ALT 32 28 27  ALKPHOS 114 112 115  BILITOT 0.7 0.4 0.3  PROT 4.2* 3.6* 3.8*  ALBUMIN 1.9* 1.6* 1.9*   No results for input(s): LIPASE, AMYLASE in the last 168 hours. No results for input(s): AMMONIA in the last 168 hours. CBC: Recent Labs  Lab 03/04/20 0538 03/04/20 0538 03/05/20 0524 03/05/20 0524 03/06/20 0547 03/07/20 0548 03/08/20 0521  WBC 6.0   < > 6.3   < > 7.0 6.4 6.4  NEUTROABS 4.2   < > 4.5   < > 5.1 4.7 4.8  HGB 12.3*   < > 12.9*   < > 13.4 11.1*  11.6*  HCT 37.5*   < > 37.5*   < > 39.3 32.7* 34.5*  MCV 98.7  --  96.9  --  96.1 96.5 96.9  PLT 129*   < > 141*   < > 144* 129* 134*   < > = values in this interval not displayed.   Cardiac Enzymes: No results for input(s): CKTOTAL, CKMB, CKMBINDEX, TROPONINI in the last 168 hours. CBG: No results for input(s): GLUCAP in the last 168 hours.  Iron Studies: No results for input(s): IRON, TIBC, TRANSFERRIN, FERRITIN in the last 72 hours. Studies/Results: No results found. Marland Kitchen aspirin EC  81 mg Oral Daily  . buPROPion  150 mg Oral Daily  . feeding supplement  1 Container Oral TID BM  . feeding supplement (ENSURE ENLIVE)  237 mL Oral BID BM  . heparin  5,000 Units Subcutaneous Q8H  . mirtazapine  15 mg Oral QHS  . multivitamin with minerals  1 tablet Oral Daily  . pantoprazole  40 mg Oral BID  . polyethylene glycol  17 g Oral BID  .  senna-docusate  2 tablet Oral BID  . sucralfate  1 g Oral TID WC & HS  . tamsulosin  0.4 mg Oral QPC supper    BMET    Component Value Date/Time   NA 141 03/08/2020 0521   K 3.4 (L) 03/08/2020 0521   CL 101 03/08/2020 0521   CO2 27 03/08/2020 0521   GLUCOSE 257 (H) 03/08/2020 0521   GLUCOSE 98 12/06/2006 1059   BUN 84 (H) 03/08/2020 0521   CREATININE 3.49 (H) 03/08/2020 0521   CALCIUM 7.8 (L) 03/08/2020 0521   GFRNONAA 16 (L) 03/08/2020 0521   GFRAA 18 (L) 03/08/2020 0521   CBC    Component Value Date/Time   WBC 6.4 03/08/2020 0521   RBC 3.56 (L) 03/08/2020 0521   HGB 11.6 (L) 03/08/2020 0521   HCT 34.5 (L) 03/08/2020 0521   PLT 134 (L) 03/08/2020 0521   MCV 96.9 03/08/2020 0521   MCH 32.6 03/08/2020 0521   MCHC 33.6 03/08/2020 0521   RDW 13.8 03/08/2020 0521   LYMPHSABS 1.1 03/08/2020 0521   MONOABS 0.5 03/08/2020 0521   EOSABS 0.0 03/08/2020 0521   BASOSABS 0.0 03/08/2020 0521     Assessment/Plan:  1. AKI/CKD stage 3- has been progressive since September 2020.  ARB held in January after episode of volume depletion and AKI/CKD.   Slight improvement of Scr today.  Mild uremic symptoms with anorexia and nausea.  No asterixis.  No urgent indication for dialysis at this time but we did discuss modalities of RRT including hemo and peritoneal dialysis.  He is not sure if he would be interested.  I have asked the RN to show him educational videos of dialysis.   1. He may have some ATN due to nephrotic range proteinuria 2. Serologies and complements negative  3. Will check SPEP/UPEP and 24 hour urine for protein.  If nephrotic would consider renal biopsy, although [place] consistent with chronic medical renal disease. 2. Proteinuria and possible nephrotic syndrome- as above. 3. FTT- EGD with gastritis and esophagitis. 4. Depression- psych following.  Medication changes made. 5. Hypoalbuminemia- possible nephrotic syndrome as well as protein and calorie malnutrition, severe. 6. HTN- stable. 7. Enlarged prostate with calcifications.  Will check psa 8. Disposition- poor prognosis given his advanced age and poor functional and nutritional status.  Palliative care consulted and goals of care discussions ongoing.   Donetta Potts, MD [employer] [phone]

## 2020-03-09 LAB — PROTEIN ELECTROPHORESIS, SERUM
A/G Ratio: 0.9 (ref 0.7–1.7)
Albumin ELP: 1.7 g/dL — ABNORMAL LOW (ref 2.9–4.4)
Alpha-1-Globulin: 0.1 g/dL (ref 0.0–0.4)
Alpha-2-Globulin: 0.7 g/dL (ref 0.4–1.0)
Beta Globulin: 0.7 g/dL (ref 0.7–1.3)
Gamma Globulin: 0.4 g/dL (ref 0.4–1.8)
Globulin, Total: 1.9 g/dL — ABNORMAL LOW (ref 2.2–3.9)
Total Protein ELP: 3.6 g/dL — ABNORMAL LOW (ref 6.0–8.5)

## 2020-03-09 LAB — CBC WITH DIFFERENTIAL/PLATELET
Abs Immature Granulocytes: 0.02 10*3/uL (ref 0.00–0.07)
Basophils Absolute: 0 10*3/uL (ref 0.0–0.1)
Basophils Relative: 0 %
Eosinophils Absolute: 0 10*3/uL (ref 0.0–0.5)
Eosinophils Relative: 0 %
HCT: 38.5 % — ABNORMAL LOW (ref 39.0–52.0)
Hemoglobin: 12.9 g/dL — ABNORMAL LOW (ref 13.0–17.0)
Immature Granulocytes: 0 %
Lymphocytes Relative: 20 %
Lymphs Abs: 1.3 10*3/uL (ref 0.7–4.0)
MCH: 32.6 pg (ref 26.0–34.0)
MCHC: 33.5 g/dL (ref 30.0–36.0)
MCV: 97.2 fL (ref 80.0–100.0)
Monocytes Absolute: 0.6 10*3/uL (ref 0.1–1.0)
Monocytes Relative: 9 %
Neutro Abs: 4.7 10*3/uL (ref 1.7–7.7)
Neutrophils Relative %: 71 %
Platelets: 133 10*3/uL — ABNORMAL LOW (ref 150–400)
RBC: 3.96 MIL/uL — ABNORMAL LOW (ref 4.22–5.81)
RDW: 13.5 % (ref 11.5–15.5)
WBC: 6.7 10*3/uL (ref 4.0–10.5)
nRBC: 0 % (ref 0.0–0.2)

## 2020-03-09 LAB — COMPREHENSIVE METABOLIC PANEL
ALT: 29 U/L (ref 0–44)
AST: 23 U/L (ref 15–41)
Albumin: 1.9 g/dL — ABNORMAL LOW (ref 3.5–5.0)
Alkaline Phosphatase: 119 U/L (ref 38–126)
Anion gap: 12 (ref 5–15)
BUN: 80 mg/dL — ABNORMAL HIGH (ref 8–23)
CO2: 24 mmol/L (ref 22–32)
Calcium: 8.4 mg/dL — ABNORMAL LOW (ref 8.9–10.3)
Chloride: 105 mmol/L (ref 98–111)
Creatinine, Ser: 3.48 mg/dL — ABNORMAL HIGH (ref 0.61–1.24)
GFR calc Af Amer: 18 mL/min — ABNORMAL LOW (ref >60)
GFR calc non Af Amer: 16 mL/min — ABNORMAL LOW (ref >60)
Glucose, Bld: 93 mg/dL (ref 70–99)
Potassium: 3.7 mmol/L (ref 3.5–5.1)
Sodium: 141 mmol/L (ref 135–145)
Total Bilirubin: 0.5 mg/dL (ref 0.3–1.2)
Total Protein: 4.1 g/dL — ABNORMAL LOW (ref 6.5–8.1)

## 2020-03-09 LAB — RENAL FUNCTION PANEL
Albumin: 1.9 g/dL — ABNORMAL LOW (ref 3.5–5.0)
Anion gap: 13 (ref 5–15)
BUN: 79 mg/dL — ABNORMAL HIGH (ref 8–23)
CO2: 24 mmol/L (ref 22–32)
Calcium: 8.4 mg/dL — ABNORMAL LOW (ref 8.9–10.3)
Chloride: 106 mmol/L (ref 98–111)
Creatinine, Ser: 3.48 mg/dL — ABNORMAL HIGH (ref 0.61–1.24)
GFR calc Af Amer: 18 mL/min — ABNORMAL LOW (ref >60)
GFR calc non Af Amer: 16 mL/min — ABNORMAL LOW (ref >60)
Glucose, Bld: 93 mg/dL (ref 70–99)
Phosphorus: 4.6 mg/dL (ref 2.5–4.6)
Potassium: 3.8 mmol/L (ref 3.5–5.1)
Sodium: 143 mmol/L (ref 135–145)

## 2020-03-09 LAB — KAPPA/LAMBDA LIGHT CHAINS
Kappa free light chain: 41 mg/L — ABNORMAL HIGH (ref 3.3–19.4)
Kappa, lambda light chain ratio: 0.32 (ref 0.26–1.65)
Lambda free light chains: 127.7 mg/L — ABNORMAL HIGH (ref 5.7–26.3)

## 2020-03-09 LAB — PHOSPHORUS: Phosphorus: 4.7 mg/dL — ABNORMAL HIGH (ref 2.5–4.6)

## 2020-03-09 LAB — MAGNESIUM: Magnesium: 2.6 mg/dL — ABNORMAL HIGH (ref 1.7–2.4)

## 2020-03-09 MED ORDER — NEPRO/CARBSTEADY PO LIQD
237.0000 mL | ORAL | Status: DC
  Administered 2020-03-09 – 2020-03-16 (×8): 237 mL via ORAL
  Filled 2020-03-09 (×10): qty 237

## 2020-03-09 NOTE — Progress Notes (Signed)
Physical Therapy Treatment Patient Details Name: Alvin Ingram MRN: [id number] DOB: [birth year] Today's Date: 03/09/2020    History of Present Illness Alvin Ingram is a 84 y.o. male with medical history significant for BPH, hyperlipidemia and GERD who presents with concerns of decreased p.o. intake and weakness.  Admitted for Failure to thrive in adult/ Severe weight loss/ Depression    PT Comments    Pt reluctantly agreeable to sit EOB however appeared to fatigue and/or disengage quickly attempting to return to bed by just leaning posteriorly.  Pt assisted back to bed safely.  Pt reports he just wants to sleep and does not like so many questions (mostly asking about pt's dizziness due to orthostatic hypotension previous session).     Follow Up Recommendations  SNF     Equipment Recommendations  Rolling walker with 5" wheels    Recommendations for Other Services       Precautions / Restrictions Precautions Precautions: Fall Precaution Comments: monitor BP (orthostatic)    Mobility  Bed Mobility Overal bed mobility: Needs Assistance Bed Mobility: Supine to Sit;Sit to Supine     Supine to sit: Min guard;HOB elevated Sit to supine: Mod assist   General bed mobility comments: pt required increased time and cues to complete sitting EOB; pt sat EOB and denies dizziness; pt sat approx 1 minute and then leaned trunk posterior with assist for support and to prevent bumping head on rail, with encouragement pt returned to sitting and then performed posterior trunk lean again, pt more agitated with questions about dizziness (has been orthostatic) so assisted trunk upright and then sidelying to return to pt to bed, required assist for trunk control and LEs onto bed  Transfers                    Ambulation/Gait                 Stairs             Wheelchair Mobility    Modified Rankin (Stroke Patients Only)       Balance                                             Cognition Arousal/Alertness: Awake/alert Behavior During Therapy: Flat affect                                   General Comments: requires max encouragement to participate in therapy, does not like questions; pt appears to 'shut down' during session      Exercises      General Comments        Pertinent Vitals/Pain Pain Assessment: No/denies pain Pain Intervention(s): Repositioned;Monitored during session    Home Living                      Prior Function            PT Goals (current goals can now be found in the care plan section) Progress towards PT goals: Not progressing toward goals - comment    Frequency    Min 2X/week      PT Plan Current plan remains appropriate;Frequency needs to be updated    Co-evaluation              AM-PAC PT "  6 Clicks" Mobility   Outcome Measure  Help needed turning from your back to your side while in a flat bed without using bedrails?: A Little Help needed moving from lying on your back to sitting on the side of a flat bed without using bedrails?: A Lot Help needed moving to and from a bed to a chair (including a wheelchair)?: A Lot Help needed standing up from a chair using your arms (e.g., wheelchair or bedside chair)?: A Lot Help needed to walk in hospital room?: A Lot Help needed climbing 3-5 steps with a railing? : A Lot 6 Click Score: 13    End of Session   Activity Tolerance: Other (comment)(has been seen by pyschiatry and starting antidepressants however not engaged at this time) Patient left: in bed;with call bell/phone within reach;with bed alarm set Nurse Communication: Mobility status PT Visit Diagnosis: Muscle weakness (generalized) (M62.81);Difficulty in walking, not elsewhere classified (R26.2)     Time: 1155-2080 PT Time Calculation (min) (ACUTE ONLY): 9 min  Charges:  $Therapeutic Activity: 8-22 mins                     Arlyce Dice, DPT Acute  Rehabilitation Services Office: [phone]  Trena Platt 03/09/2020, 3:19 PM

## 2020-03-09 NOTE — Progress Notes (Signed)
PROGRESS NOTE    Alvin Ingram  HKV:[id number] DOB: Jul 19, 1936 DOA: 02/22/2020 PCP: Binnie Rail, MD   Brief Narrative Alvin Ingram a 84 y.o.malewith medical history significant forBPHfollowed by urology, hyperlipidemia and GERD and other comorbidities who presented to [facility] ED as advised by his niecewith concerns fordecreased p.o. intake, suspected uncontrolled depression, weight loss,and generalizedweakness. Evaluated by his PCP about 2 weeksprior. Labsshowing mildly elevated LFTs (AST of 40 and ALT of 59). On Elavil for depression which can cause elevation in LFTs. ACT of his abdomen in February (01/02/20)showed extensive sigmoid diverticulosis and a15 mm hypodense lesion of the distal body of the pancreas that was indeterminate. MRI abdomen (02/25/20) showeda cystic lesion of the pancreas, with radiology recommendation for survillance in 2 years.   My colleagues spoke to the patient's niece via phone and she is concerned that he might be depressed, recently lost a friend. Admitted to feeling depressed without suicidal or homicidal ideation. Seen by psych with recommendation to DC Elavil and start Zoloft. Appetite remained poor. Evaluated by PT OT with recommendation for SNF with 24-hour assistance/supervision. Receptive to going to SNF.    GI was consulted for Nausea, Vomiting and Weight loss.  Had EGD done 4/5 showing esophagitis and hemorrhagic gastritis.  Biopsies pending.  Also seen by ENT due to concern for hoarseness in the setting of significant weight loss in a former smoker.  No significant findings.   03/03/20 he was feeling okay and complains that he does not really have nausea but when I saw him he did not really eat his breakfast because he was afraid of being nauseous.  Ate a little bit he states.  GI recommending continuing PPI twice daily.  PT OT recommending skilled nursing facility.    03/04/20 Unfortunately patient could not be discharged yesterday given  that his SNF authorization has expired and they are requesting updated PT and OT notes however patient been refusing to work with PT OT due to his fatigue.  This could potentially be a barrier to discharge given that he is improved from a GI standpoint and they have signed off.    03/05/20 Today the patient worked with physical therapy but then became orthostatic when standing we will give him some more fluid resuscitation and stop his Lasix and give him a 1 L fluid bolus as well as started on maintenance IV fluids at 75 MLS per hour.  We have ordered compression stockings and have the patient work with physical therapy again in the morning.  03/06/20 patient has had slightly worsening renal function and his fluids have now been adjusted and started on bicarbonate given that he has a metabolic acidosis.  Patient was hypotensive yesterday in the setting of his orthostasis and did not want to work with the nurse today to have orthostatics done today.  He wanted to sleep.  Remains depressed and will continue to monitor his renal function if is not improving will consult nephrology for further evaluations  03/07/2020 renal function continued to worsen so fluids were stopped and nephrology was reconsulted.  Dr. Jonnie Finner does not feel any indications for renal replacement therapy at this time and denies any reversible causes.  He feels that the patient continues to have worsening renal function off of p.o. Lasix IV Lasix challenge may be indicated versus dialysis versus conservative care.  Dr. Jonnie Finner was the patient has a component of nephrotic syndrome and it is worsened with malnutrition.  03/08/2020 patient was still withdrawn and wanting to rest.  He is complaining of some nausea and some reflux but no dizziness today.  Evaluated him and they are going to check an SPEP and a UPEP and 24-hour urine for his protein and if it is consistent with nephrotic syndrome they are going to consider a renal biopsy and the  discussion about renal replacement therapy was brought up by Dr. Marval Regal patient is not sure if he would be interested.  03/09/2020 patient continues remain depressed and laying in bed and wants a "hot blanket."  I spoke with his niece and encouraged her to try and encourage the patient to be motivated to participate in examination as well as therapies.  Renal function slightly improved today and nephrology feels there is some mild uremic symptoms with anorexia and nausea.  Do not feel it is an urgent need of dialysis at this time.  Nightly he has refused to discuss the dialysis with the nephrologist today.  They are checking a PSA given his history of enlarged prostate and this was normal at 1.89  Assessment & Plan:   Principal Problem:   Major depressive disorder, recurrent episode, moderate (HCC) Active Problems:   AKI (acute kidney injury) ([place])   Failure to thrive in adult   Transaminitis   Weakness   Hypernatremia   Protein-calorie malnutrition, severe  Failure to thrive in adult/ Severe weight loss/ Depression -Presented with poor oral intake, poor appetite,weight loss, uncontrolled depression -Elavil DCed and started on Zoloft 02/25/20 by Psychiatry and will continue -He had excessive sedation on Elavil and elevation in his LFTs -Encourage increase in oral protein calorie intake -Follow up with psych outpatient when he gets out of the hospital -See Below -Palliative care consulted and patient was not interested so of care recommending outpatient follow-up with palliative care at SNF -PT OT recommending SNF however he has been refusing to work with him but was able to work with physical therapist today and yesterday when he did became orthostatic -Do not want orthostatics done today given that he wanted to rest; fluids now stopped -Patient is very depressed and is not motivated and only wants to sleep whenever I see him; -Psychiatry reconsulted today and they have discontinued  sertraline and adding Wellbutrin 150 mg p.o. daily for depression and increased motivation as well as adding mirtazapine 50 mg p.o. nightly for depression and insomnia and appetite stimulation -She remains significantly withdrawn and depressed and states that he was nauseous a little bit today and complaining of reflux; nephrology believes that this is some mild uremic symptoms but there is no urgent indication for dialysis as his renal function is slightly improved  Esophagitis/Hemorrhagic Gastritis -Management directed by GI as below -Continued IV PPI twice daily and Carafate and transitioned to po  -Continue with PPI and Carafate p.o. given his esophagitis  Intractable nausea with transient vomiting, improved  -Continue antiemetics PRN -Continue recommendations per GI. -GI recommended ENT evaluation for chronic hoarseness and dysphagia with weight loss nausea and vomiting and patient has had chronic hoarseness with mild cough without symptoms of aspiration reflux laryngoscope was performed and showed normal bilateral vocal cord mobility without evidence of vocal cord nodule mass or hypopharynx -ENT believes the patient may have some vocal cord atrophy related to age changes and feels the swallowing issues may be related to chronic nausea and vomiting and GI issues and feels that there is no mucous lesion upper hypopharynx or larynx -Continue to monitor and continue with antiemetics as necessary -Patient is no longer nauseous and will  continue to encourage eating but nursing states that he is not been eating very much -Continue to encourage oral intake  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 mild Hyperphosphatemia -Presented with creatinine 3.24, proteuniuria -Baseline creatinine 2 with GFR 38 -BUN/creatinine remained relatively stable since admission but had worsened last 1 to 2 years is now acutely worsening and worsened from 3.03 and has gone to 83/3.81; now slowly improving with a  BUN/creatinine of 79/3.48 likely have reached a plateau currently -Patient had a CO2 of 21, anion gap of 16, chloride level of 106 and today his CO2 is 24, chloride level is 106, and anion gap was 13 -Continue to avoid nephrotoxins, hypotension, contrast dyes and renally adjust medications -Continue to monitor urine output -Follow recommendations per nephrology; nephrology feels that he may have some nephrotic syndrome and have sent off serologies for prognostic purposes but patient not a good candidate for immunosuppression given his age and felt in condition at this time and they are rescheduling his outpatient office visit for 3 to 4 weeks and then recommending p.o. Lasix daily for 7 to 10 days and then stopping; he got 3 days of p.o. Lasix Lasix but will stop given worsened Renal Fxn and significant orthostatic Hypotension -IV fluid hydration is now stopped as patient remains volume overloaded a little bit but we are not giving him any Lasix at this time -Patient's potassium this morning was 4.7 -CK was 43 -Nephrology feels that he be remains volume overloaded on exam and his renal function continues to worsen off of p.o. Lasix that they recommend considering IV Lasix challenge versus consideration of dialysis versus conservative care -Nephrology is checking SPEP/UPEP and a 24-hour urine for protein and if this is consistent with nephrotic syndrome they are considering renal biopsy; they have also talked to the patient about renal replacement therapy including hemodialysis versus peritoneal dialysis and is not sure if you be interested -Patient is kappa free light chain was 41 and lambda light chain free was 127.7 with a kappa lambda ratio of 0.32 -Repeat CMP within the a.m continue to monitor renal function carefully  Acute Transaminitisin the setting of Elavil, improving and resolved -Possibly iatrogenic, Elavil can increase LFTs -LFTs are trending down and have improved as AST is 23 and ALT  is 29 -Right upper quadrant abdominal ultrasound 02/22/20 negative. No gallstones or wall thickening visualized. No sonographic Murphy sign noted by sonographer. -Acute hepatitis panel negative -Continue to avoid hepatotoxic agents  15 mm low attenuating lesion with fluid attenuation along the posterior distal body of the pancreas/B/L renal cysts -MRI shows cystic lesion of the pancreas, -Recommend surveillance withrepeat imagings in 2 years.  Resolved Hypovolemic hypernatremia with iV fluid -Presented with serum sodium 153 --> and improved to 143 -Improved on D5% at 50 cc/hr but will stop;  -He was started on Lasix this was stopped when his renal function started worsening and he became significantly orthostatic -Now IV fluids have been stopped -Continue to Monitor volume status but will stop IVF   Chronic Diastolic CHF -Last 2D echo done on 12/17/2019 showed LVEF 65-70% With grade 1 diastolic dysfunction -Initially held off lasix due to recent dehydration and AKI but this has been resumed and now will stop again -Was getting IVF Hydration at 50 mL/hr with D5W but will stop and start Lasix 40 mg po Daily for 7 Days at the recommendation of nephrology but this was stopped after his significant orthostatic hypotension and worsening renal function -We will watch off of  IV fluids and he may need some IV Lasix per nephrology -Given a 1 L normal saline bolus and will start maintenance fluids at 75 MLS per hour and this was changed to sodium bicarbonate but is now stopped -Continue strict I's and O's and daily weight; patient is + 1.735 L since admission -Continue to monitor volume status extremely carefully  Right Pleural effusion on Renal US proved and resolved -Incidental finding -Monitor volume status on IV fluid and will stop IV fluid hydration and start po Lasix 40 mg Daily -Continue incentive spirometer -Maintain O2 sat>92% -Repeat chest x-ray showed "Cardiac shadow is within  normal limits. Aortic calcifications are again seen. The lungs are hyperinflated. No focal infiltrate or sizable effusion is seen. No bony abnormality is noted." -Lasix stopped and shows fluids -Repeat chest x-ray in a.m.  Orthostatic Hypotension -Significant  -Stopped  p.o. Lasix 3 days ago and give a 1 Liter Bolus and start Maintenance IVF at NS at 75 mL/hr maintenance fluid has not been changed to sodium bicarbonate at 75 mL's per hour but now stopped fluids altogether given his continued volume overload -Patient is too withdrawn to -Continue to monitor orthostatic vital signs carefully -We will order TED hose  GERD -GI recommends pantoprazole 40 mg p.o. twice daily for 4 weeks followed by 40 mg p.o. once daily for another 4 weeks and also recommending Carafate for 2 weeks; Was changed to po  -Post EGD 03/01/2020 by Dr. Regina Eck with findings as stated above -GI recommending following up with Dr. Watt Climes or Primary GI doctor made off in 6 to 8 weeks after discharge  BPH -Continue Tamsulosin 0.4 mg po daily after supper -Follow up with Urology in the outpatient setting  -PSA was 1.89  Ambulatory dysfunction with frequent falls -Has had 3 falls at home within the last 2 weeks per his niece -[name] in the bathroom hospital 3/29, denies hitting his head -Denies feeling dizzy prior to falling -Lives alone, has not been able to appropriately take care of himself per his niece. -Continue PT OT with assistance and fall precautions. -TOC assisting with SNF placement as patient is agreeable however he is now recently refusing PT OT evaluation to SNF authorization is expired and they request updated PT notes; patient has been acutely orthostatic and did not want to try and repeat them today given his weakness; continues to be extremely depressed and lay in bed -He worked with physical therapy today and was initially agreeable to sit at the edge of the bed however appeared to fatigue and returned  back to the bed leaning posteriorly  Avascular necrosis of the left femoral neck without acute fracture or cortical collapse from CT abd/pelvis wo contrast 01/02/20. -Fall Precautions -Follow up as an outpatient and continue PT/OT efforts  Normocytic Anemia -Patient's hemoglobin/hematocit is now 12.9/30.5 -Continue to monitor for signs and symptoms of bleeding; currently no overt bleeding noted -Repeat CBC in a.m.  Thrombocytopenia -Mild but appears relatively stable for last few times it is checked and is 133,000 today  -Continue to monitor for signs and symptoms of bleeding; -Currently no overt bleeding noted -Repeat CBC in a.m.  DVT prophylaxis: Heparin 5,000 units sq q8h  Code Status: FULL CODE  Family Communication: No family present at bedside but spoke with the niece Marquita via the telephone Disposition Plan: Patient is from home and now requires SNF level of care given his significant weakness and deconditioning.  GI is now signed off the case given that his symptomology is  improved somewhat but he is severely depressed and has not been very motivated to work with therapies.  During the course of his hospitalization became acutely orthostatic and his Lasix was stopped..  We will need close monitoring for his oral intake. A barrier to his discharge however is that his SNF authorization is expired and that he has been refusing to work with PT and OT recently suspect due to his depression and without updated PT notes he cannot safely be discharged to skilled nursing facility as he cannot be accepted despite him having a bed there.  When he did work with the physical therapist he is orthostatic in we will need to ensure there is no longer orthostatic prior to safe discharge.  Another complication is that his renal function is worsening and nephrology was reconsulted and nephrology feels that he may have a component of nephrotic syndrome and malnutrition; nephrology feels the patient's renal  function may have plateaued and continue to monitor and have ordered a SPEP/UPEP;  patient continues to remain significantly depressed  Consultants:   Gastroenterology  Palliative Care Medicine  ENT  Nephrology; reconsulted  Psychiatry    Procedures:  EGD 4/5 Findings:      The Z-line was regular and was found 42 cm from the incisors.      LA Grade C (one or more mucosal breaks continuous between tops of 2 or       more mucosal folds, less than 75% circumference) esophagitis with no       bleeding was found 40 cm from the incisors. Biopsies were taken with a       cold forceps for histology.      Scattered severe inflammation with hemorrhage characterized by       congestion (edema), erosions, erythema and friability was found in the       gastric body and in the gastric antrum. Biopsies were taken with a cold       forceps for histology.      The cardia and gastric fundus were normal on retroflexion.      Extrinsic compression on the stomach was found in the gastric antrum.      The duodenal bulb, first portion of the duodenum and second portion of       the duodenum were normal. Impression:               - Z-line regular, 42 cm from the incisors.                           - LA Grade C esophagitis with no bleeding. Biopsied.                           - Gastritis with hemorrhage. Biopsied.                           - Extrinsic compression in the gastric antrum.                           - Normal duodenal bulb, first portion of the                            duodenum and second portion of the duodenum.   Antimicrobials:  Anti-infectives (From admission, onward)   None  Subjective: Seen and examined and he continues remain withdrawn and was laying in bed asking for help like it.  States that he was annoyed with the suction from the wall given that was hissing.  Wanting to sleep.  No nausea or vomiting.  No other concerns at this time  Objective: Vitals:   03/08/20  0601 03/08/20 1412 03/09/20 0522 03/09/20 1308  BP: 108/66 111/61 130/77 131/90  Pulse: 81 72 77 73  Resp: 16 19 20 18   Temp: (!) 97.4 F (36.3 C) 97.8 F (36.6 C) 97.7 F (36.5 C) 97.7 F (36.5 C)  TempSrc: Oral Oral Oral   SpO2: 94% 98% 98% 98%  Weight:      Height:        Intake/Output Summary (Last 24 hours) at 03/09/2020 1612 Last data filed at 03/09/2020 2774 Gross per 24 hour  Intake 50 ml  Output 250 ml  Net -200 ml   Filed Weights   03/01/20 0547 03/01/20 1204 03/02/20 0500  Weight: 66.2 kg 66.2 kg 66 kg   Examination: Physical Exam:  Constitutional: Thin frail elderly chronically ill-appearing African-American male who is extremely depressed and withdrawn and continues to lay in bed and does not seem motivated Eyes: Lids and conjunctivae normal, sclerae anicteric  ENMT: External Ears, Nose appear normal. Grossly normal hearing.  Neck: Appears normal, supple, no cervical masses, normal ROM, no appreciable thyromegaly; no JVD Respiratory: Mildly diminished to auscultation bilaterally, no wheezing, rales, rhonchi or crackles. Normal respiratory effort and patient is not tachypenic. No accessory muscle use.  Unlabored breathing Cardiovascular: RRR, no murmurs / rubs / gallops. S1 and S2 auscultated.  Continues to have 1+ lower extremity edema Abdomen: Soft, non-tender, non-distended.  Bowel sounds positive.  GU: Deferred. Musculoskeletal: No clubbing / cyanosis of digits/nails. No joint deformity upper and lower extremities.  Skin: No rashes, lesions, ulcers on limited skin evaluation. No induration; Warm and dry.  Neurologic: CN 2-12 grossly intact with no focal deficits. Romberg sign and cerebellar reflexes not assessed.  Psychiatric: Normal judgment and insight.  He is awake and alert but wants to be left alone.  Has a significant depressed and withdrawn mood and a flat affect.   Data Reviewed: I have personally reviewed following labs and imaging  studies  CBC: Recent Labs  Lab 03/05/20 0524 03/06/20 0547 03/07/20 0548 03/08/20 0521 03/09/20 0559  WBC 6.3 7.0 6.4 6.4 6.7  NEUTROABS 4.5 5.1 4.7 4.8 4.7  HGB 12.9* 13.4 11.1* 11.6* 12.9*  HCT 37.5* 39.3 32.7* 34.5* 38.5*  MCV 96.9 96.1 96.5 96.9 97.2  PLT 141* 144* 129* 134* 128*   Basic Metabolic Panel: Recent Labs  Lab 03/05/20 0524 03/06/20 0547 03/07/20 0548 03/08/20 0521 03/09/20 0559  NA 143 141 140 141 141  143  K 4.1 4.2 3.5 3.4* 3.7  3.8  CL 106 109 106 101 105  106  CO2 21* 18* 25 27 24  24   GLUCOSE 81 81 138* 257* 93  93  BUN 67* 76* 83* 84* 80*  79*  CREATININE 3.30* 3.54* 3.81* 3.49* 3.48*  3.48*  CALCIUM 8.6* 8.5* 7.7* 7.8* 8.4*  8.4*  MG 2.5* 2.4 2.4 2.6* 2.6*  PHOS 5.1* 6.0* 5.0* 4.6 4.7*  4.6   GFR: Estimated Creatinine Clearance: 15.5 mL/min (A) (by C-G formula based on SCr of 3.48 mg/dL (H)). Liver Function Tests: Recent Labs  Lab 03/05/20 0524 03/06/20 0547 03/07/20 0548 03/08/20 0521 03/09/20 0559  AST 21 19 25 23 23   ALT  36 32 28 27 29   ALKPHOS 119 114 112 115 119  BILITOT 0.8 0.7 0.4 0.3 0.5  PROT 4.2* 4.2* 3.6* 3.8* 4.1*  ALBUMIN 1.9* 1.9* 1.6* 1.9* 1.9*  1.9*   No results for input(s): LIPASE, AMYLASE in the last 168 hours. No results for input(s): AMMONIA in the last 168 hours. Coagulation Profile: No results for input(s): INR, PROTIME in the last 168 hours. Cardiac Enzymes: Recent Labs  Lab 03/08/20 1402  CKTOTAL 38*   BNP (last 3 results) Recent Labs    12/29/19 1639 02/13/20 1431  PROBNP 224.0* 84.0   HbA1C: No results for input(s): HGBA1C in the last 72 hours. CBG: No results for input(s): GLUCAP in the last 168 hours. Lipid Profile: No results for input(s): CHOL, HDL, LDLCALC, TRIG, CHOLHDL, LDLDIRECT in the last 72 hours. Thyroid Function Tests: No results for input(s): TSH, T4TOTAL, FREET4, T3FREE, THYROIDAB in the last 72 hours. Anemia Panel: No results for input(s): VITAMINB12, FOLATE,  FERRITIN, TIBC, IRON, RETICCTPCT in the last 72 hours. Sepsis Labs: No results for input(s): PROCALCITON, LATICACIDVEN in the last 168 hours.  No results found for this or any previous visit (from the past 240 hour(s)).   RN Pressure Injury Documentation:     Estimated body mass index is 19.2 kg/m as calculated from the following:   Height as of this encounter: 6\' 1"  (1.854 m).   Weight as of this encounter: 66 kg.  Malnutrition Type:  Nutrition Problem: Severe Malnutrition Etiology: acute illness(unknown etiology)   Malnutrition Characteristics:  Signs/Symptoms: moderate fat depletion, moderate muscle depletion, severe muscle depletion, percent weight loss Percent weight loss: 20 %   Nutrition Interventions:  Interventions: Ensure Enlive (each supplement provides 350kcal and 20 grams of protein), Magic cup, MVI   Radiology Studies: No results found. Scheduled Meds: . aspirin EC  81 mg Oral Daily  . buPROPion  150 mg Oral Daily  . feeding supplement  1 Container Oral TID BM  . feeding supplement (ENSURE ENLIVE)  237 mL Oral BID BM  . heparin  5,000 Units Subcutaneous Q8H  . mirtazapine  15 mg Oral QHS  . multivitamin with minerals  1 tablet Oral Daily  . pantoprazole  40 mg Oral BID  . polyethylene glycol  17 g Oral BID  . senna-docusate  2 tablet Oral BID  . sucralfate  1 g Oral TID WC & HS  . tamsulosin  0.4 mg Oral QPC supper   Continuous Infusions:   LOS: 14 days   Kerney Elbe, DO [facility] PAGER is on AMION  If 7PM-7AM, please contact night-coverage [email or web address]

## 2020-03-09 NOTE — Progress Notes (Signed)
Nutrition Follow-up  DOCUMENTATION CODES:   Severe malnutrition in context of acute illness/injury, Underweight  INTERVENTION:  Discontinue Boost Breeze Continue Ensure Enlive BID Nepro Shake po daily, each supplement provides 425 kcal and 19 grams protein Magic cup BID with meals, each supplement provides 290 kcal and 9 grams of protein Continue to encourage po intake of meals and supplement, provide feeding assistance as needed  Monitor the need for nutrition support Recommend obtaining new wt as able to assess trends  NUTRITION DIAGNOSIS:   Severe Malnutrition related to acute illness(unknown etiology) as evidenced by moderate fat depletion, moderate muscle depletion, severe muscle depletion, percent weight loss. Ongoing   GOAL:   Patient will meet greater than or equal to 90% of their needs Unmet   MONITOR:   PO intake, Supplement acceptance, Labs, Weight trends  REASON FOR ASSESSMENT:   Consult Assessment of nutrition requirement/status  ASSESSMENT:  RD working remotely.  84 y.o. male with medical history of BPH, hyperlipidemia, and GERD who presented to the ED with concerns of decreased p.o. intake and weakness. He is not a good historian overall. He is able to tolerate some fruit and cereal but has had a poor appetite and has not had a balanced meal for several weeks. He reported nausea and occasional vomiting that may be worse with food intake. He sometimes notices abdominal pain. He thinks he can hear his "stomach sloshing around" when he gets up to use the restroom. He reported losing 25 lb in the couple of weeks PTA. He also reported issues with balance and feeling like he is going to fall over when he stands up. Family history includes a brother with lung cancer but no other abdominal related cancer of which he is aware.  Patient discharge on 4/3 was delayed due to episode of vomiting and elevated LFTs. GI was consulted, EGD on 4/5 showed esophagitis and hemorrhagic  gastritis. Patient was to be discharged on 4/7, but SNF authorization had expired and requesting updated PT and OT notes however pt was refusing to work with PT/OT due to fatigue. Per notes, pt with ongoing depression today, his renal function is slightly improved, noted mild uremic symptoms with anorexia and nausea per nephrology.  Patient diet advanced to soft on 4/6, per flowsheets he consumed 0% of breakfast and lunch on 4/8, 0% of breakfast and 10% of lunch on 4/9, and 0% of all meals on 4/11. Patient is refusing Boost Breeze supplements and accepting of Ensure ~50% of the time per medication review. Will discontinue Boost Breeze supplement and provide additional supplements as outlined above. Patient with ongoing severe malnutrition and poor po intake during admission, recommend considering placing a small bore NG tube for nutrition support.   No new weights since 4/6. Recommend obtaining current weight to assess trends.    Medications reviewed and include: remeron, MVI, miralax, sarafate Labs: P 4.7 (H), Mg 2.6 (H)  Diet Order:   Diet Order            DIET SOFT Room service appropriate? Yes; Fluid consistency: Thin  Diet effective now              EDUCATION NEEDS:   No education needs have been identified at this time  Skin:  Skin Assessment: Reviewed RN Assessment  Last BM:  PTA/unknown  Height:   Ht Readings from Last 1 Encounters:  03/01/20 6\' 1"  (1.854 m)    Weight:   Wt Readings from Last 1 Encounters:  03/02/20 66 kg  Ideal Body Weight:  83.6 kg  BMI:  Body mass index is 19.2 kg/m.  Estimated Nutritional Needs:   Kcal:  1870-2055 kcal  Protein:  80-95 grams  Fluid:  >/= 2 L/day  Lajuan Lines, RD, LDN Clinical Nutrition After Hours/Weekend Pager # in [place]

## 2020-03-09 NOTE — Progress Notes (Signed)
Patient ID: Alvin Ingram, male   DOB: [birth year], 84 y.o.   MRN: [id number] S: Very withdrawn today and reluctant to talk.  "I want to sleep" O:BP 130/77 (BP Location: Right Arm)   Pulse 77   Temp 97.7 F (36.5 C) (Oral)   Resp 20   Ht 6\' 1"  (1.854 m)   Wt 66 kg   SpO2 98%   BMI 19.20 kg/m   Intake/Output Summary (Last 24 hours) at 03/09/2020 1238 Last data filed at 03/09/2020 0762 Gross per 24 hour  Intake 50 ml  Output 250 ml  Net -200 ml   Intake/Output: I/O last 3 completed shifts: In: 150 [P.O.:150] Out: 650 [Urine:650]  Intake/Output this shift:  No intake/output data recorded. Weight change:  Gen: cachectic male in NAD CVS: no rub  Resp:cta Abd: benign Ext: 2+ lower ext edema  Recent Labs  Lab 03/03/20 0535 03/04/20 0538 03/05/20 0524 03/06/20 0547 03/07/20 0548 03/08/20 0521 03/09/20 0559  NA 142 141 143 141 140 141 141  143  K 3.9 4.2 4.1 4.2 3.5 3.4* 3.7  3.8  CL 111 108 106 109 106 101 105  106  CO2 23 24 21* 18* 25 27 24  24   GLUCOSE 100* 96 81 81 138* 257* 93  93  BUN 65* 66* 67* 76* 83* 84* 80*  79*  CREATININE 3.09* 3.13* 3.30* 3.54* 3.81* 3.49* 3.48*  3.48*  ALBUMIN 1.8* 1.9* 1.9* 1.9* 1.6* 1.9* 1.9*  1.9*  CALCIUM 8.1* 8.2* 8.6* 8.5* 7.7* 7.8* 8.4*  8.4*  PHOS  --  4.7* 5.1* 6.0* 5.0* 4.6 4.7*  4.6  AST 22 21 21 19 25 23 23   ALT 47* 41 36 32 28 27 29    Liver Function Tests: Recent Labs  Lab 03/07/20 0548 03/08/20 0521 03/09/20 0559  AST 25 23 23   ALT 28 27 29   ALKPHOS 112 115 119  BILITOT 0.4 0.3 0.5  PROT 3.6* 3.8* 4.1*  ALBUMIN 1.6* 1.9* 1.9*  1.9*   No results for input(s): LIPASE, AMYLASE in the last 168 hours. No results for input(s): AMMONIA in the last 168 hours. CBC: Recent Labs  Lab 03/05/20 0524 03/05/20 0524 03/06/20 0547 03/06/20 0547 03/07/20 0548 03/08/20 0521 03/09/20 0559  WBC 6.3   < > 7.0   < > 6.4 6.4 6.7  NEUTROABS 4.5   < > 5.1   < > 4.7 4.8 4.7  HGB 12.9*   < > 13.4   < > 11.1* 11.6* 12.9*   HCT 37.5*   < > 39.3   < > 32.7* 34.5* 38.5*  MCV 96.9  --  96.1  --  96.5 96.9 97.2  PLT 141*   < > 144*   < > 129* 134* 133*   < > = values in this interval not displayed.   Cardiac Enzymes: Recent Labs  Lab 03/08/20 1402  CKTOTAL 38*   CBG: No results for input(s): GLUCAP in the last 168 hours.  Iron Studies: No results for input(s): IRON, TIBC, TRANSFERRIN, FERRITIN in the last 72 hours. Studies/Results: No results found. Marland Kitchen aspirin EC  81 mg Oral Daily  . buPROPion  150 mg Oral Daily  . feeding supplement  1 Container Oral TID BM  . feeding supplement (ENSURE ENLIVE)  237 mL Oral BID BM  . heparin  5,000 Units Subcutaneous Q8H  . mirtazapine  15 mg Oral QHS  . multivitamin with minerals  1 tablet Oral Daily  . pantoprazole  40 mg  Oral BID  . polyethylene glycol  17 g Oral BID  . senna-docusate  2 tablet Oral BID  . sucralfate  1 g Oral TID WC & HS  . tamsulosin  0.4 mg Oral QPC supper    BMET    Component Value Date/Time   NA 143 03/09/2020 0559   NA 141 03/09/2020 0559   K 3.8 03/09/2020 0559   K 3.7 03/09/2020 0559   CL 106 03/09/2020 0559   CL 105 03/09/2020 0559   CO2 24 03/09/2020 0559   CO2 24 03/09/2020 0559   GLUCOSE 93 03/09/2020 0559   GLUCOSE 93 03/09/2020 0559   GLUCOSE 98 12/06/2006 1059   BUN 79 (H) 03/09/2020 0559   BUN 80 (H) 03/09/2020 0559   CREATININE 3.48 (H) 03/09/2020 0559   CREATININE 3.48 (H) 03/09/2020 0559   CALCIUM 8.4 (L) 03/09/2020 0559   CALCIUM 8.4 (L) 03/09/2020 0559   GFRNONAA 16 (L) 03/09/2020 0559   GFRNONAA 16 (L) 03/09/2020 0559   GFRAA 18 (L) 03/09/2020 0559   GFRAA 18 (L) 03/09/2020 0559   CBC    Component Value Date/Time   WBC 6.7 03/09/2020 0559   RBC 3.96 (L) 03/09/2020 0559   HGB 12.9 (L) 03/09/2020 0559   HCT 38.5 (L) 03/09/2020 0559   PLT 133 (L) 03/09/2020 0559   MCV 97.2 03/09/2020 0559   MCH 32.6 03/09/2020 0559   MCHC 33.5 03/09/2020 0559   RDW 13.5 03/09/2020 0559   LYMPHSABS 1.3 03/09/2020  0559   MONOABS 0.6 03/09/2020 0559   EOSABS 0.0 03/09/2020 0559   BASOSABS 0.0 03/09/2020 0559    Assessment/Plan:  1. AKI/CKD stage 3- has been progressive since September 2020.  ARB held in January after episode of volume depletion and AKI/CKD.  Slight improvement of Scr today.  Mild uremic symptoms with anorexia and nausea.  No asterixis.  No urgent indication for dialysis at this time but we did discuss modalities of RRT including hemo and peritoneal dialysis.  He is not sure if he would be interested.  I have asked the RN to show him educational videos of dialysis.   1. He may have some ATN due to nephrotic range proteinuria 2. Serologies and complements negative  3. Awaiting results of SPEP/UPEP and 24 hour urine for protein.  If nephrotic would consider renal biopsy, although [place] consistent with chronic medical renal disease. 4. Thankfully the BUN/Cr appear to have reached a plateau 5. No indication for dialysis at this time.   6. He refused to discuss dialysis today. 2. Proteinuria and possible nephrotic syndrome- as above. 3. FTT- EGD with gastritis and esophagitis. 4. Depression- psych following.  Medication changes made. 5. Hypoalbuminemia- possible nephrotic syndrome as well as protein and calorie malnutrition, severe. 6. HTN- stable. 7. Enlarged prostate with calcifications.  Will check psa 8. Disposition- poor prognosis given his advanced age and poor functional and nutritional status.  Palliative care consulted and goals of care discussions ongoing.   Donetta Potts, MD [employer] [phone]

## 2020-03-10 LAB — CBC WITH DIFFERENTIAL/PLATELET
Abs Immature Granulocytes: 0.01 10*3/uL (ref 0.00–0.07)
Basophils Absolute: 0 10*3/uL (ref 0.0–0.1)
Basophils Relative: 0 %
Eosinophils Absolute: 0.1 10*3/uL (ref 0.0–0.5)
Eosinophils Relative: 1 %
HCT: 38 % — ABNORMAL LOW (ref 39.0–52.0)
Hemoglobin: 12.6 g/dL — ABNORMAL LOW (ref 13.0–17.0)
Immature Granulocytes: 0 %
Lymphocytes Relative: 23 %
Lymphs Abs: 1.5 10*3/uL (ref 0.7–4.0)
MCH: 32.6 pg (ref 26.0–34.0)
MCHC: 33.2 g/dL (ref 30.0–36.0)
MCV: 98.4 fL (ref 80.0–100.0)
Monocytes Absolute: 0.6 10*3/uL (ref 0.1–1.0)
Monocytes Relative: 10 %
Neutro Abs: 4.2 10*3/uL (ref 1.7–7.7)
Neutrophils Relative %: 66 %
Platelets: 140 10*3/uL — ABNORMAL LOW (ref 150–400)
RBC: 3.86 MIL/uL — ABNORMAL LOW (ref 4.22–5.81)
RDW: 13.8 % (ref 11.5–15.5)
WBC: 6.3 10*3/uL (ref 4.0–10.5)
nRBC: 0 % (ref 0.0–0.2)

## 2020-03-10 LAB — PROTEIN, URINE, 24 HOUR
Collection Interval-UPROT: 24 hours
Protein, 24H Urine: UNDETERMINED mg/d (ref 50–100)
Protein, Urine: >3000 mg/dL
Urine Total Volume-UPROT: 750 mL

## 2020-03-10 LAB — COMPREHENSIVE METABOLIC PANEL
ALT: 28 U/L (ref 0–44)
AST: 22 U/L (ref 15–41)
Albumin: 1.8 g/dL — ABNORMAL LOW (ref 3.5–5.0)
Alkaline Phosphatase: 115 U/L (ref 38–126)
Anion gap: 10 (ref 5–15)
BUN: 81 mg/dL — ABNORMAL HIGH (ref 8–23)
CO2: 27 mmol/L (ref 22–32)
Calcium: 8.1 mg/dL — ABNORMAL LOW (ref 8.9–10.3)
Chloride: 107 mmol/L (ref 98–111)
Creatinine, Ser: 3.76 mg/dL — ABNORMAL HIGH (ref 0.61–1.24)
GFR calc Af Amer: 16 mL/min — ABNORMAL LOW (ref >60)
GFR calc non Af Amer: 14 mL/min — ABNORMAL LOW (ref >60)
Glucose, Bld: 96 mg/dL (ref 70–99)
Potassium: 4.2 mmol/L (ref 3.5–5.1)
Sodium: 144 mmol/L (ref 135–145)
Total Bilirubin: 0.3 mg/dL (ref 0.3–1.2)
Total Protein: 4 g/dL — ABNORMAL LOW (ref 6.5–8.1)

## 2020-03-10 LAB — PHOSPHORUS: Phosphorus: 4.9 mg/dL — ABNORMAL HIGH (ref 2.5–4.6)

## 2020-03-10 LAB — CREATININE CLEARANCE, URINE, 24 HOUR
Collection Interval-CRCL: 24 hours
Creatinine Clearance: 13 mL/min — ABNORMAL LOW (ref 75–125)
Creatinine, 24H Ur: 695 mg/d — ABNORMAL LOW (ref 800–2000)
Creatinine, Urine: 92.71 mg/dL
Urine Total Volume-CRCL: 750 mL

## 2020-03-10 LAB — MAGNESIUM: Magnesium: 2.6 mg/dL — ABNORMAL HIGH (ref 1.7–2.4)

## 2020-03-10 MED ORDER — HEPARIN SODIUM (PORCINE) 5000 UNIT/ML IJ SOLN
5000.0000 [IU] | Freq: Three times a day (TID) | INTRAMUSCULAR | Status: DC
  Administered 2020-03-11 – 2020-03-17 (×14): 5000 [IU] via SUBCUTANEOUS
  Filled 2020-03-10 (×14): qty 1

## 2020-03-10 NOTE — Consult Note (Signed)
Chief Complaint: Patient was seen in consultation today for image guided random renal biopsy Chief Complaint  Patient presents with   Failure To Thrive     Referring Physician(s): Coladonato,J  Supervising Physician: Daryll Brod  Patient Status: [facility] - In-pt  History of Present Illness: Alvin Ingram is an 84 y.o. male with past medical history significant for diverticulosis, GERD, hyperlipidemia, hypertension who was admitted to [facility] on 02/22/2020 with decreased p.o. intake, suspected depression, FTT, weight loss and generalized weakness.  He was evaluated by nephrology for progressive acute on chronic kidney disease with proteinuria/possible nephrotic syndrome, latest creatinine 3.76.  Request now received for image guided random renal biopsy for further evaluation. Past Medical History:  Diagnosis Date   Cardiac murmur    Aortic   Diverticulosis 2006   GERD (gastroesophageal reflux disease)    History of colonic polyps 2003,2011   Dr Earlean Shawl   History of syncope 1966   ? stress related   Hyperlipidemia    Hypertension    hypertensive response @ stress test    Past Surgical History:  Procedure Laterality Date   BIOPSY  03/01/2020   Procedure: BIOPSY;  Surgeon: Otis Brace, MD;  Location: [facility];  Service: Gastroenterology;;   CATARACT EXTRACTION  2003 & 2011   Dr Charise Killian   COLONOSCOPY  04/2005   tics, no polyps. Dr.Medoff   COLONOSCOPY W/ POLYPECTOMY  2003, 2011   neg 2006; due 2016. Dr Earlean Shawl   ESOPHAGOGASTRODUODENOSCOPY (EGD) WITH PROPOFOL N/A 03/01/2020   Procedure: ESOPHAGOGASTRODUODENOSCOPY (EGD) WITH PROPOFOL;  Surgeon: Otis Brace, MD;  Location: [facility];  Service: Gastroenterology;  Laterality: N/A;    Allergies: Patient has no known allergies.  Medications: Prior to Admission medications   Medication Sig Start Date End Date Taking? Authorizing Provider  amitriptyline (ELAVIL) 25 MG tablet Take 25 mg by  mouth at bedtime.   Yes [provider]  aspirin 81 MG tablet Take 81 mg by mouth daily.     Yes [provider]  Multiple Vitamins-Minerals (CENTRUM SILVER PO) Take 1 tablet by mouth daily.    Yes [provider]  omeprazole (PRILOSEC) 20 MG capsule Take 1 capsule by mouth once daily Patient taking differently: Take 20 mg by mouth daily.  08/25/19  Yes Burns, Claudina Lick, MD  polyethylene glycol (MIRALAX / GLYCOLAX) 17 g packet Take 17 g by mouth daily. 12/20/19  Yes Lucky Cowboy, MD  senna (SENOKOT) 8.6 MG TABS tablet Take 1 tablet (8.6 mg total) by mouth daily. Patient taking differently: Take 1 tablet by mouth daily as needed for mild constipation.  12/20/19  Yes Lucky Cowboy, MD  simvastatin (ZOCOR) 20 MG tablet TAKE 1 TABLET BY MOUTH AT BEDTIME Patient taking differently: Take 20 mg by mouth at bedtime.  10/27/19  Yes Burns, Claudina Lick, MD  tamsulosin (FLOMAX) 0.4 MG CAPS capsule Take 0.4 mg by mouth daily after supper.    Yes [provider]  furosemide (LASIX) 40 MG tablet Take 1 tablet by mouth once daily Patient not taking: Reported on 02/22/2020 01/21/20   Binnie Rail, MD  sertraline (ZOLOFT) 25 MG tablet Take 1 tablet (25 mg total) by mouth daily. 02/28/20 05/28/20  Kayleen Memos, DO     Family History  Problem Relation Age of Onset   Heart attack Mother 65   Colon polyps Brother    Pneumonia Brother    Heart attack Brother 25   Lung cancer Brother  Other Father        Benign Brain Tumor   Hypertension Brother    Tuberculosis Paternal Uncle    Other Maternal Grandmother    Diabetes Neg Hx    Stroke Neg Hx     Social History   Socioeconomic History   Marital status: Single    Spouse name: Not on file   Number of children: Not on file   Years of education: Not on file   Highest education level: Not on file  Occupational History   Not on file  Tobacco Use   Smoking status: Former Smoker    Quit date: 11/28/1967     Years since quitting: 52.3   Smokeless tobacco: Never Used   Tobacco comment: smoked 1955-1969 , up to 1 ppd  Substance and Sexual Activity   Alcohol use: Yes    Comment: RARELY   Drug use: No   Sexual activity: Not Currently  Other Topics Concern   Not on file  Social History Narrative   Not on file   Social Determinants of Health   Financial Resource Strain:    Difficulty of Paying Living Expenses:   Food Insecurity:    Worried About Charity fundraiser in the Last Year:    Arboriculturist in the Last Year:   Transportation Needs:    Film/video editor (Medical):    Lack of Transportation (Non-Medical):   Physical Activity:    Days of Exercise per Week:    Minutes of Exercise per Session:   Stress:    Feeling of Stress :   Social Connections:    Frequency of Communication with Friends and Family:    Frequency of Social Gatherings with Friends and Family:    Attends Religious Services:    Active Member of Clubs or Organizations:    Attends Archivist Meetings:    Marital Status:       Review of Systems see above; denies fever, headache, worsening chest pain, dyspnea, cough, abdominal/back pain,  vomiting or bleeding.  He does have significant fatigue and fairly flat affect, some hoarseness, occ nausea.   Vital Signs: BP 105/65 (BP Location: Right Arm)    Pulse 78    Temp 97.8 F (36.6 C)    Resp 18    Ht 6\' 1"  (1.854 m)    Wt 145 lb 8.1 oz (66 kg)    SpO2 99%    BMI 19.20 kg/m   Physical Exam awake, answering questions appropriately; fairly flat affect.  Chest with distant breath sounds bilaterally.  Heart with regular rate and rhythm.  Abdomen soft, positive bowel sounds, nontender.  Bilateral lower extremity edema noted  Imaging: DG Chest 2 View  Result Date: 02/13/2020 CLINICAL DATA:  Weight loss. Occasional dyspnea. Former smoker. EXAM: CHEST - 2 VIEW COMPARISON:  December 16, 2019 FINDINGS: The cardiomediastinal silhouette is  normal. The lungs are hyperinflated. There are barrel chest deformity and bilateral hilar architectural distortion. There is no pneumonia, pulmonary edema, pleural effusion or pneumothorax. Moderate skeletal degenerative change and aortic knob calcified atherosclerosis are present. IMPRESSION: Moderate hyperinflation without evidence of acute cardiopulmonary disease. Thoracic aortic calcified atherosclerosis. Electronically Signed   By: Revonda Humphrey   On: 02/13/2020 23:22   CT SOFT TISSUE NECK WO CONTRAST  Result Date: 03/02/2020 CLINICAL DATA:  Dysphagia. EGD with biopsy yesterday. Esophagitis and hemorrhagic gastritis. Nausea vomiting and weight loss. EXAM: CT NECK WITHOUT CONTRAST TECHNIQUE: Multidetector CT imaging of the neck was  performed following the standard protocol without intravenous contrast. COMPARISON:  None. FINDINGS: Pharynx and larynx: Asymmetric enlargement of the left laryngeal ventricle. Thinning of the left vocal cord. Mild thickening of the left aryepiglottic fold. 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 Correlate with hoarseness. No mass lesion identified. Epiglottis and pharynx otherwise normal. Salivary glands: No inflammation, mass, or stone. Thyroid: Negative Lymph nodes: No enlarged lymph nodes in the neck. Vascular: Limited vascular evaluation without intravenous contrast. Limited intracranial: Negative Visualized orbits: Bilateral cataract extraction.  No orbital mass. Mastoids and visualized paranasal sinuses: Mild mucosal edema paranasal sinuses. Air-fluid level left sphenoid sinus. Bony thickening left sphenoid sinus. Mastoid clear bilaterally. Skeleton: Cervical spondylosis with disc and facet degeneration at multiple levels. No acute skeletal abnormality. Upper chest: Visualized lung apices clear bilaterally. Mild apical scarring bilaterally. 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 Correlate  with hoarseness. Electronically Signed   By: Franchot Gallo M.D.   On: 03/02/2020 15:49   MR ABDOMEN WO CONTRAST  Result Date: 02/25/2020 CLINICAL DATA:  Pancreatic lesion on CT EXAM: MRI ABDOMEN WITHOUT CONTRAST TECHNIQUE: Multiplanar multisequence MR imaging was performed without the administration of intravenous contrast. COMPARISON:  CT abdomen/pelvis dated 01/02/2020 FINDINGS: Limited evaluation due to motion degradation and lack of intravenous contrast administration. Lower chest: Lung bases are clear. Hepatobiliary: Unenhanced liver is unremarkable. No morphologic findings of cirrhosis. No hepatic steatosis. Gallbladder is unremarkable. No intrahepatic or extrahepatic ductal dilatation. Pancreas: 8 mm unilocular cyst along the proximal pancreatic tail (series 3/image 8), likely benign. Additional 14 mm unilocular cyst at the junction of the pancreatic tail and anterior left kidney (series 5/image 23). While the origin is indeterminate on the current study, this favors a pancreatic cyst on prior CT. No pancreatic atrophy or ductal dilatation. Spleen:  Within normal limits. Adrenals/Urinary Tract:  Adrenal glands are within normal limits. Multiple bilateral renal cysts, measuring up to 3.8 cm in the anterior left lower kidney (series 5/image 29), simple/benign on MRI. No hydronephrosis. Stomach/Bowel: Stomach is within normal limits. Visualized bowel is unremarkable, noting sigmoid diverticulosis. Vascular/Lymphatic:  No evidence of abdominal aortic aneurysm. No suspicious abdominal lymphadenopathy. Other:  No abdominal ascites. Musculoskeletal: No focal osseous lesions. IMPRESSION: Limited evaluation due to motion degradation and lack of intravenous contrast administration. Two unilocular pancreatic tail cysts measuring up to 14 mm, likely benign. Given patient's age, it is unclear that follow-up is warranted. However, if desired, initial follow-up CT or MRI abdomen with/without contrast is suggested in 2  years. This recommendation follows ACR consensus guidelines: Management of Incidental Pancreatic Cysts: A [place] Paper of the ACR Incidental Findings Committee. [place] 3220;[id number]. Electronically Signed   By: Julian Hy M.D.   On: 02/25/2020 20:50   US RENAL  Result Date: 02/29/2020 CLINICAL DATA:  Acute kidney injury EXAM: RENAL / URINARY TRACT ULTRASOUND COMPLETE COMPARISON:  Abdominal MRI February 25, 2020; renal ultrasound December 17, 2019 FINDINGS: Right Kidney: Renal measurements: 11.1 x 4.0 x 4.5 cm = volume: 104.6 mL . Echogenicity is increased. Renal cortical thickness within normal limits. No perinephric fluid or hydronephrosis visualized. There is a cyst in the upper right kidney measuring 3.3 x 3.1 x 1.8 cm. There is a cyst in the mid right kidney measuring 2.0 x 2.0 x 1.9 cm. Several smaller renal cysts are noted on the right. No sonographically demonstrable calculus or ureterectasis. Left Kidney: Renal measurements: 11.1 x 6.5 x 6.6 cm =  volume: 249.4 mL. Echogenicity is increased. Renal cortical thickness is normal. No perinephric fluid or hydronephrosis visualized. Multiple renal cysts noted on the left. Largest cyst arises in the lower pole region measuring 3.9 x 3.8 x 3.3 cm. A cyst in the upper pole region is mildly septated and measures 3.3 x 2.3 x 2.2 cm. A cyst in the mid left kidney measures 3.0 x 2.8 x 2.5 cm. Smaller cysts noted as well. No sonographically demonstrable calculus or ureterectasis. Bladder: Appears normal for degree of bladder distention. Other: Prostatic calcifications noted. Prostate measures 4.9 x 4.3 x 3.2 cm with a measured prostate volume of 34.6 cubic cm. Small right pleural effusion. IMPRESSION: 1. Echogenic kidneys bilaterally, a finding indicative of medical renal disease. Renal cortical thickness normal. No obstructing focus in either kidney. 2. Multiple renal cysts bilaterally. There is septation in a cyst on the left. Other cysts are simple  cysts. 3.  Prostatic calculi.  Prostate mildly prominent in size. 4.  Small right pleural effusion. Electronically Signed   By: Lowella Grip III M.D.   On: 02/29/2020 08:52   DG CHEST PORT 1 VIEW  Result Date: 03/04/2020 CLINICAL DATA:  Shortness of breath EXAM: PORTABLE CHEST 1 VIEW COMPARISON:  02/13/2020 FINDINGS: Cardiac shadow is within normal limits. Aortic calcifications are again seen. The lungs are hyperinflated. No focal infiltrate or sizable effusion is seen. No bony abnormality is noted. IMPRESSION: COPD without acute abnormality. Electronically Signed   By: Inez Catalina M.D.   On: 03/04/2020 08:08   DG ABD ACUTE 2+V W 1V CHEST  Result Date: 02/22/2020 CLINICAL DATA:  Nausea with eating. EXAM: DG ABDOMEN ACUTE W/ 1V CHEST COMPARISON:  CT abdomen pelvis dated 01/02/2020. FINDINGS: There is no evidence of dilated bowel loops or free intraperitoneal air. No radiopaque calculi or other significant radiographic abnormality is seen. Heart size and mediastinal contours are within normal limits. The lungs are hyperinflated. Both lungs are clear. IMPRESSION: Negative abdominal radiographs. No acute cardiopulmonary disease. Hyperinflated lungs likely reflect chronic obstructive pulmonary disease. Electronically Signed   By: Zerita Boers M.D.   On: 02/22/2020 17:34   US Abdomen Limited RUQ  Result Date: 02/22/2020 CLINICAL DATA:  Elevated LFTs, nausea EXAM: ULTRASOUND ABDOMEN LIMITED RIGHT UPPER QUADRANT COMPARISON:  01/02/2020, 05/02/2007 FINDINGS: Gallbladder: No gallstones or wall thickening visualized. No sonographic Murphy sign noted by sonographer. Common bile duct: Diameter: 3 mm Liver: No focal lesion identified. Within normal limits in parenchymal echogenicity. Portal vein is patent on color Doppler imaging with normal direction of blood flow towards the liver. Other: None. IMPRESSION: 1. Unremarkable right upper quadrant ultrasound. Electronically Signed   By: Randa Ngo M.D.   On:  02/22/2020 17:56    Labs:  CBC: Recent Labs    03/07/20 0548 03/08/20 0521 03/09/20 0559 03/10/20 0524  WBC 6.4 6.4 6.7 6.3  HGB 11.1* 11.6* 12.9* 12.6*  HCT 32.7* 34.5* 38.5* 38.0*  PLT 129* 134* 133* 140*    COAGS: Recent Labs    02/22/20 2128  INR 0.9    BMP: Recent Labs    03/07/20 0548 03/08/20 0521 03/09/20 0559 03/10/20 0524  NA 140 141 141   143 144  K 3.5 3.4* 3.7   3.8 4.2  CL 106 101 105   106 107  CO2 25 27 24   24 27   GLUCOSE 138* 257* 93   93 96  BUN 83* 84* 80*   79* 81*  CALCIUM 7.7* 7.8* 8.4*   8.4*  8.1*  CREATININE 3.81* 3.49* 3.48*   3.48* 3.76*  GFRNONAA 14* 16* 16*   16* 14*  GFRAA 16* 18* 18*   18* 16*    LIVER FUNCTION TESTS: Recent Labs    03/07/20 0548 03/08/20 0521 03/09/20 0559 03/10/20 0524  BILITOT 0.4 0.3 0.5 0.3  AST 25 23 23 22   ALT 28 27 29 28   ALKPHOS 112 115 119 115  PROT 3.6* 3.8* 4.1* 4.0*  ALBUMIN 1.6* 1.9* 1.9*   1.9* 1.8*    TUMOR MARKERS: No results for input(s): AFPTM, CEA, CA199, CHROMGRNA in the last 8760 hours.  Assessment and Plan: 84 y.o. male with past medical history significant for diverticulosis, GERD, hyperlipidemia, hypertension who was admitted to [facility] on 02/22/2020 with decreased p.o. intake, suspected depression, FTT, weight loss and generalized weakness.  He was evaluated by nephrology for progressive acute on chronic kidney disease with proteinuria/possible nephrotic syndrome, latest creatinine 3.76.  Request now received for image guided random renal biopsy for further evaluation.Risks and benefits of procedure was discussed with the patient and/or patient's family (niece- Counselling psychologist) including, but not limited to bleeding, infection, damage to adjacent structures or low yield requiring additional tests.  All of the questions were answered and there is agreement to proceed.  Consent signed and in chart.      Thank you for this interesting consult.  I greatly enjoyed  meeting Jerrelle Michelsen and look forward to participating in their care.  A copy of this report was sent to the requesting provider on this date.  Electronically Signed: D. Rowe Robert, PA-C 03/10/2020, 3:22 PM   I spent a total of  30 minutes   in face to face in clinical consultation, greater than 50% of which was counseling/coordinating care for image guided random renal biopsy

## 2020-03-10 NOTE — Progress Notes (Addendum)
OT Cancellation Note  Patient Details Name: Alvin Ingram MRN: [id number] DOB: 1936/02/03   Cancelled Treatment:    Reason Eval/Treat Not Completed: Patient declined, no reason specified, declined participating in therapies even at bed level. Reports will try "later", with mumbled speech so difficult to fully understand. Will follow up as able for OT treatment.  Of note reattempted later this PM, pt refused.   Lou Cal, OT Acute Rehabilitation Services Pager [phone] Office [phone]   Raymondo Band 03/10/2020, 2:50 PM

## 2020-03-10 NOTE — Progress Notes (Addendum)
PROGRESS NOTE    Alvin Ingram  WKM:[id number] DOB: 03-20-1936 DOA: 02/22/2020 PCP: Binnie Rail, MD   Brief Narrative Alvin Ingram a 84 y.o.malewith medical history significant forBPHfollowed by urology, hyperlipidemia and GERD and other comorbidities who presented to [facility] ED as advised by his niecewith concerns fordecreased p.o. intake, suspected uncontrolled depression, weight loss,and generalizedweakness. Evaluated by his PCP about 2 weeksprior.  ACT of his abdomen in February (01/02/20)showed extensive sigmoid diverticulosis and a15 mm hypodense lesion of the distal body of the pancreas that was indeterminate. MRI abdomen (02/25/20) showeda cystic lesion of the pancreas, with radiology recommendation for survillance in 2 years.  -my partners d/w patient's niece via phone and she was concerned that he might be depressed, recently lost a friend. Admitted to feeling depressed without suicidal or homicidal ideation. Seen by psych with recommendation to DC Elavil and start Zoloft. Appetite remained poor. Evaluated by PT OT with recommendation for SNF with 24-hour assistance/supervision. Receptive to going to SNF.   -Then , GI was consulted for Nausea, Vomiting and Weight loss.  Had EGD done 4/5 showing esophagitis and hemorrhagic gastritis.    Path was notable for mild reactive gastropathy -Also seen by ENT due to concern for hoarseness in the setting of significant weight loss in a former smoker.  Laryngoscopy was unremarkable as well as CT neck -Subsequently he was noted to have worsening kidney function  -Seen by nephrology, they were concerned for nephrotic syndrome, renal biopsy was discussed  Assessment & Plan:   Failure to thrive in adult/ Severe weight loss/ Depression -Presented with poor oral intake, poor appetite,weight loss, uncontrolled depression -Elavil DCed and started on Zoloft 02/25/20 by Psychiatry -Continue to encourage oral increase protein  intake, -Palliative care consulted, patient unwilling to discuss anything further, they recommended outpatient follow-up with palliative care at SNF -Patient is very depressed and is not motivated and only wants to sleep -Psychiatry reconsulted and subsequently discontinued sertraline and adding Wellbutrin 150 mg p.o. daily for depression and increased motivation as well as adding mirtazapine 50 mg p.o. nightly for depression and insomnia and appetite stimulation -Continues to have poor appetite, nausea, poor motivation to get out of bed,  Sit up  AKI on CKD IV Severe hypoalbuminemia ?  Nephrotic syndrome -Presented with creatinine 3.24, proteuniuria -Baseline creatinine 2 with GFR 38 -BUN/creatinine remained relatively stable since admission however subsequently continued to worsen in the last 72 hours, creatinine up to 3.4 now  -Appreciate nephrology consult, plan for renal biopsy -Patient is not felt to be a good dialysis candidate -Urine protein greater than 3 g, UPEP pending, serologies and complements, SPEP negative  Esophagitis/Hemorrhagic Gastritis -Continue PPI twice daily   chronic hoarseness  -CT neck and laryngoscopic exam was unremarkable by ENT  -Who suspects hoarseness may be related to GERD  15 mm low attenuating lesion with fluid attenuation along the posterior distal body of the pancreas/B/L renal cysts -MRI shows cystic lesion of the pancreas, -Recommend surveillance withrepeat imagings in 2 years.  Resolved Hypovolemic hypernatremia with iV fluid -resolved with hydration using D5  Chronic Diastolic CHF -Last 2D echo done on 12/17/2019 showed LVEF 65-70% With grade 1 diastolic dysfunction -Did receive Lasix in the early part of this admission which was subsequently stopped  Orthostatic Hypotension -Following diuresis, Lasix subsequently discontinued and he was given fluid bolus -No monitor  BPH -Continue Tamsulosin 0.4 mg  -Follow up with Urology   -PSA was 1.89  Ambulatory dysfunction with frequent falls -Has had 3 falls  at home within the last 2 weeks per his niece -Lives alone, has not been able to appropriately take care of himself per his niece. -Continue PT OT with assistance and fall precautions. 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 -Fall Precautions -Follow up as an outpatient and continue PT/OT efforts  Normocytic Anemia  -stable  Thrombocytopenia -mild, stable  DVT prophylaxis: Heparin subcutaneous Code Status: Full code Family Communication: No family at bedside, d/w neice M.Hunter Disposition Plan: Plan for SNF, patient is from, disposition complicated by severe ongoing failure to thrive, minimal oral intake, and now worsening kidney function with nephrotic syndrome  Consultants:   Gastroenterology  Palliative Care Medicine  ENT  Nephrology; reconsulted  Psychiatry    Procedures:  EGD 4/5 Findings:      The Z-line was regular and was found 42 cm from the incisors.      LA Grade C (one or more mucosal breaks continuous between tops of 2 or       more mucosal folds, less than 75% circumference) esophagitis with no       bleeding was found 40 cm from the incisors. Biopsies were taken with a       cold forceps for histology.      Scattered severe inflammation with hemorrhage characterized by       congestion (edema), erosions, erythema and friability was found in the       gastric body and in the gastric antrum. Biopsies were taken with a cold       forceps for histology.      The cardia and gastric fundus were normal on retroflexion.      Extrinsic compression on the stomach was found in the gastric antrum.      The duodenal bulb, first portion of the duodenum and second portion of       the duodenum were normal. Impression:                - Z-line regular, 42 cm from the incisors.                           - LA Grade C esophagitis with no bleeding. Biopsied.                           - Gastritis with hemorrhage. Biopsied.                           - Extrinsic compression in the gastric antrum.                           - Normal duodenal bulb, first portion of the                            duodenum and second portion of the duodenum.   Antimicrobials:  Anti-infectives (From admission, onward)   None     Subjective: -Annoyed about being asked questions, wants to be left alone, refused to eat, tells me that he had a few sips to drink  Objective: Vitals:   03/09/20 0522 03/09/20 1308 03/09/20 2231 03/10/20 0527  BP: 130/77 131/90 (!) 151/95 106/77  Pulse: 77  73 77 80  Resp: 20 18 17 17   Temp: 97.7 F (36.5 C) 97.7 F (36.5 C) (!) 97.4 F (36.3 C) 97.7 F (36.5 C)  TempSrc: Oral  Oral Oral  SpO2: 98% 98% 98% 96%  Weight:      Height:        Intake/Output Summary (Last 24 hours) at 03/10/2020 1339 Last data filed at 03/10/2020 0532 Gross per 24 hour  Intake --  Output 525 ml  Net -525 ml   Filed Weights   03/01/20 0547 03/01/20 1204 03/02/20 0500  Weight: 66.2 kg 66.2 kg 66 kg   Examination: Physical Exam:  Gen: Chronically ill elderly male, laying in bed, opens eyes to verbal stimuli, refuses to engage or interact much, oriented to self and place HEENT: No icterus or JVD Lungs: Decreased breath sounds the bases CVS S1-S2, regular rate rhythm Abd: soft, Non tender, non distended, BS present Extremities: 2+ edema Skin: no new rashes on exposed skin   Data Reviewed: I have personally reviewed following labs and imaging studies  CBC: Recent Labs  Lab 03/06/20 0547 03/07/20 0548 03/08/20 0521 03/09/20 0559 03/10/20 0524  WBC 7.0 6.4 6.4 6.7 6.3  NEUTROABS 5.1 4.7 4.8 4.7 4.2  HGB 13.4 11.1* 11.6* 12.9* 12.6*  HCT 39.3 32.7* 34.5* 38.5* 38.0*  MCV 96.1 96.5 96.9 97.2 98.4  PLT 144* 129*  134* 133* 250*   Basic Metabolic Panel: Recent Labs  Lab 03/06/20 0547 03/07/20 0548 03/08/20 0521 03/09/20 0559 03/10/20 0524  NA 141 140 141 141  143 144  K 4.2 3.5 3.4* 3.7  3.8 4.2  CL 109 106 101 105  106 107  CO2 18* 25 27 24  24 27   GLUCOSE 81 138* 257* 93  93 96  BUN 76* 83* 84* 80*  79* 81*  CREATININE 3.54* 3.81* 3.49* 3.48*  3.48* 3.76*  CALCIUM 8.5* 7.7* 7.8* 8.4*  8.4* 8.1*  MG 2.4 2.4 2.6* 2.6* 2.6*  PHOS 6.0* 5.0* 4.6 4.7*  4.6 4.9*   GFR: Estimated Creatinine Clearance: 14.4 mL/min (A) (by C-G formula based on SCr of 3.76 mg/dL (H)). Liver Function Tests: Recent Labs  Lab 03/06/20 0547 03/07/20 0548 03/08/20 0521 03/09/20 0559 03/10/20 0524  AST 19 25 23 23 22   ALT 32 28 27 29 28   ALKPHOS 114 112 115 119 115  BILITOT 0.7 0.4 0.3 0.5 0.3  PROT 4.2* 3.6* 3.8* 4.1* 4.0*  ALBUMIN 1.9* 1.6* 1.9* 1.9*  1.9* 1.8*   No results for input(s): LIPASE, AMYLASE in the last 168 hours. No results for input(s): AMMONIA in the last 168 hours. Coagulation Profile: No results for input(s): INR, PROTIME in the last 168 hours. Cardiac Enzymes: Recent Labs  Lab 03/08/20 1402  CKTOTAL 38*   BNP (last 3 results) Recent Labs    12/29/19 1639 02/13/20 1431  PROBNP 224.0* 84.0   HbA1C: No results for input(s): HGBA1C in the last 72 hours. CBG: No results for input(s): GLUCAP in the last 168 hours. Lipid Profile: No results for input(s): CHOL, HDL, LDLCALC, TRIG, CHOLHDL, LDLDIRECT in the last 72 hours. Thyroid Function Tests: No results for input(s): TSH, T4TOTAL, FREET4, T3FREE, THYROIDAB in the last 72 hours. Anemia Panel: No results for input(s): VITAMINB12, FOLATE, FERRITIN, TIBC, IRON, RETICCTPCT in the last 72 hours. Sepsis Labs: No results for input(s): PROCALCITON, LATICACIDVEN in the last 168 hours.  No results found for this or any previous visit (from the past 240 hour(s)).   RN Pressure  Injury Documentation:     Estimated body mass  index is 19.2 kg/m as calculated from the following:   Height as of this encounter: 6\' 1"  (1.854 m).   Weight as of this encounter: 66 kg.  Malnutrition Type:  Nutrition Problem: Severe Malnutrition Etiology: acute illness(unknown etiology)   Malnutrition Characteristics:  Signs/Symptoms: moderate fat depletion, moderate muscle depletion, severe muscle depletion, percent weight loss Percent weight loss: 20 %   Nutrition Interventions:  Interventions: Ensure Enlive (each supplement provides 350kcal and 20 grams of protein), Magic cup, MVI   Radiology Studies: No results found. Scheduled Meds: . aspirin EC  81 mg Oral Daily  . buPROPion  150 mg Oral Daily  . feeding supplement (ENSURE ENLIVE)  237 mL Oral BID BM  . feeding supplement (NEPRO CARB STEADY)  237 mL Oral Q24H  . heparin  5,000 Units Subcutaneous Q8H  . mirtazapine  15 mg Oral QHS  . multivitamin with minerals  1 tablet Oral Daily  . pantoprazole  40 mg Oral BID  . polyethylene glycol  17 g Oral BID  . senna-docusate  2 tablet Oral BID  . sucralfate  1 g Oral TID WC & HS  . tamsulosin  0.4 mg Oral QPC supper   Continuous Infusions:   LOS: 15 days   Domenic Polite, MD [facility]

## 2020-03-10 NOTE — Progress Notes (Addendum)
Patient ID: Alvin Ingram, male   DOB: 1936/07/01, 84 y.o.   MRN: [id number] S: He again is upset about being disturbed and just wants to "rest".  He refuses to talk about dialysis but would be amenable for a kidney biopsy. O:BP 106/77 (BP Location: Left Arm)   Pulse 80   Temp 97.7 F (36.5 C) (Oral)   Resp 17   Ht 6\' 1"  (1.854 m)   Wt 66 kg   SpO2 96%   BMI 19.20 kg/m   Intake/Output Summary (Last 24 hours) at 03/10/2020 1153 Last data filed at 03/10/2020 0532 Gross per 24 hour  Intake -  Output 525 ml  Net -525 ml   Intake/Output: I/O last 3 completed shifts: In: 31 [P.O.:50] Out: 775 [Urine:775]  Intake/Output this shift:  No intake/output data recorded. Weight change:  Gen: cachectic in NAD CVS: no rub Resp: cta Abd: +BS, soft, NT Ext: 2+ edema of lower extremities  Recent Labs  Lab 03/04/20 0538 03/05/20 0524 03/06/20 0547 03/07/20 0548 03/08/20 0521 03/09/20 0559 03/10/20 0524  NA 141 143 141 140 141 141  143 144  K 4.2 4.1 4.2 3.5 3.4* 3.7  3.8 4.2  CL 108 106 109 106 101 105  106 107  CO2 24 21* 18* 25 27 24  24 27   GLUCOSE 96 81 81 138* 257* 93  93 96  BUN 66* 67* 76* 83* 84* 80*  79* 81*  CREATININE 3.13* 3.30* 3.54* 3.81* 3.49* 3.48*  3.48* 3.76*  ALBUMIN 1.9* 1.9* 1.9* 1.6* 1.9* 1.9*  1.9* 1.8*  CALCIUM 8.2* 8.6* 8.5* 7.7* 7.8* 8.4*  8.4* 8.1*  PHOS 4.7* 5.1* 6.0* 5.0* 4.6 4.7*  4.6 4.9*  AST 21 21 19 25 23 23 22   ALT 41 36 32 28 27 29 28    Liver Function Tests: Recent Labs  Lab 03/08/20 0521 03/09/20 0559 03/10/20 0524  AST 23 23 22   ALT 27 29 28   ALKPHOS 115 119 115  BILITOT 0.3 0.5 0.3  PROT 3.8* 4.1* 4.0*  ALBUMIN 1.9* 1.9*  1.9* 1.8*   No results for input(s): LIPASE, AMYLASE in the last 168 hours. No results for input(s): AMMONIA in the last 168 hours. CBC: Recent Labs  Lab 03/06/20 0547 03/06/20 0547 03/07/20 0548 03/07/20 0548 03/08/20 0521 03/09/20 0559 03/10/20 0524  WBC 7.0   < > 6.4   < > 6.4 6.7 6.3   NEUTROABS 5.1   < > 4.7   < > 4.8 4.7 4.2  HGB 13.4   < > 11.1*   < > 11.6* 12.9* 12.6*  HCT 39.3   < > 32.7*   < > 34.5* 38.5* 38.0*  MCV 96.1  --  96.5  --  96.9 97.2 98.4  PLT 144*   < > 129*   < > 134* 133* 140*   < > = values in this interval not displayed.   Cardiac Enzymes: Recent Labs  Lab 03/08/20 1402  CKTOTAL 38*   CBG: No results for input(s): GLUCAP in the last 168 hours.  Iron Studies: No results for input(s): IRON, TIBC, TRANSFERRIN, FERRITIN in the last 72 hours. Studies/Results: No results found. Marland Kitchen aspirin EC  81 mg Oral Daily  . buPROPion  150 mg Oral Daily  . feeding supplement (ENSURE ENLIVE)  237 mL Oral BID BM  . feeding supplement (NEPRO CARB STEADY)  237 mL Oral Q24H  . heparin  5,000 Units Subcutaneous Q8H  . mirtazapine  15 mg Oral QHS  .  multivitamin with minerals  1 tablet Oral Daily  . pantoprazole  40 mg Oral BID  . polyethylene glycol  17 g Oral BID  . senna-docusate  2 tablet Oral BID  . sucralfate  1 g Oral TID WC & HS  . tamsulosin  0.4 mg Oral QPC supper    BMET    Component Value Date/Time   NA 144 03/10/2020 0524   K 4.2 03/10/2020 0524   CL 107 03/10/2020 0524   CO2 27 03/10/2020 0524   GLUCOSE 96 03/10/2020 0524   GLUCOSE 98 12/06/2006 1059   BUN 81 (H) 03/10/2020 0524   CREATININE 3.76 (H) 03/10/2020 0524   CALCIUM 8.1 (L) 03/10/2020 0524   GFRNONAA 14 (L) 03/10/2020 0524   GFRAA 16 (L) 03/10/2020 0524   CBC    Component Value Date/Time   WBC 6.3 03/10/2020 0524   RBC 3.86 (L) 03/10/2020 0524   HGB 12.6 (L) 03/10/2020 0524   HCT 38.0 (L) 03/10/2020 0524   PLT 140 (L) 03/10/2020 0524   MCV 98.4 03/10/2020 0524   MCH 32.6 03/10/2020 0524   MCHC 33.2 03/10/2020 0524   RDW 13.8 03/10/2020 0524   LYMPHSABS 1.5 03/10/2020 0524   MONOABS 0.6 03/10/2020 0524   EOSABS 0.1 03/10/2020 0524   BASOSABS 0.0 03/10/2020 0524   Assessment/Plan:  1. AKI/CKD stage 3- has been progressive since September 2020. ARB held in  January after episode of volume depletion and AKI/CKD. Slight improvement of Scr today. Mild uremic symptoms with anorexia and nausea. No asterixis. No urgent indication for dialysis at this time but we did discuss modalities of RRT including hemo and peritoneal dialysis. He is not sure if he would be interested. I have asked the RN to show him educational videos of dialysis.  1. He may have some ATN due to nephrotic range proteinuria 2. Serologies and complements negative as well as SPEP/ligh chains 3. Awaiting results of UPEP  4. 24 hour urine for protein significant for >3,000 mg. He is amenable to proceed with renal biopsy.  Will consult IR for US guided renal biopsy.  Hopefully we can find something treatable as his kidney function has worsened over the past 6-7 months. 5. No indication for dialysis at this time.   6. He refused to discuss dialysis today. 2. Proteinuria and possible nephrotic syndrome- as above. 3. FTT- EGD with gastritis and esophagitis. 4. Depression- psych following. Medication changes made. 5. Hypoalbuminemia- possible nephrotic syndrome as well as protein and calorie malnutrition, severe. 6. HTN- stable. 7. Enlarged prostate with calcifications. Normal psa 8. Disposition- poor prognosis given his advanced age and poor functional and nutritional status. Palliative care consulted and goals of care discussions ongoing.    Donetta Potts, MD [employer] [facility] [phone]

## 2020-03-11 ENCOUNTER — Inpatient Hospital Stay (HOSPITAL_COMMUNITY): Payer: Medicare HMO

## 2020-03-11 LAB — RENAL FUNCTION PANEL
Albumin: 1.7 g/dL — ABNORMAL LOW (ref 3.5–5.0)
Anion gap: 8 (ref 5–15)
BUN: 81 mg/dL — ABNORMAL HIGH (ref 8–23)
CO2: 29 mmol/L (ref 22–32)
Calcium: 8.2 mg/dL — ABNORMAL LOW (ref 8.9–10.3)
Chloride: 108 mmol/L (ref 98–111)
Creatinine, Ser: 3.67 mg/dL — ABNORMAL HIGH (ref 0.61–1.24)
GFR calc Af Amer: 17 mL/min — ABNORMAL LOW (ref >60)
GFR calc non Af Amer: 15 mL/min — ABNORMAL LOW (ref >60)
Glucose, Bld: 103 mg/dL — ABNORMAL HIGH (ref 70–99)
Phosphorus: 4.1 mg/dL (ref 2.5–4.6)
Potassium: 3.9 mmol/L (ref 3.5–5.1)
Sodium: 145 mmol/L (ref 135–145)

## 2020-03-11 LAB — PROTIME-INR
INR: 1 (ref 0.8–1.2)
Prothrombin Time: 13 seconds (ref 11.4–15.2)

## 2020-03-11 LAB — BASIC METABOLIC PANEL
Anion gap: 11 (ref 5–15)
BUN: 83 mg/dL — ABNORMAL HIGH (ref 8–23)
CO2: 26 mmol/L (ref 22–32)
Calcium: 8.2 mg/dL — ABNORMAL LOW (ref 8.9–10.3)
Chloride: 109 mmol/L (ref 98–111)
Creatinine, Ser: 3.63 mg/dL — ABNORMAL HIGH (ref 0.61–1.24)
GFR calc Af Amer: 17 mL/min — ABNORMAL LOW (ref >60)
GFR calc non Af Amer: 15 mL/min — ABNORMAL LOW (ref >60)
Glucose, Bld: 105 mg/dL — ABNORMAL HIGH (ref 70–99)
Potassium: 3.9 mmol/L (ref 3.5–5.1)
Sodium: 146 mmol/L — ABNORMAL HIGH (ref 135–145)

## 2020-03-11 LAB — CBC WITH DIFFERENTIAL/PLATELET
Abs Immature Granulocytes: 0.02 10*3/uL (ref 0.00–0.07)
Basophils Absolute: 0 10*3/uL (ref 0.0–0.1)
Basophils Relative: 0 %
Eosinophils Absolute: 0 10*3/uL (ref 0.0–0.5)
Eosinophils Relative: 1 %
HCT: 35 % — ABNORMAL LOW (ref 39.0–52.0)
Hemoglobin: 11.4 g/dL — ABNORMAL LOW (ref 13.0–17.0)
Immature Granulocytes: 0 %
Lymphocytes Relative: 20 %
Lymphs Abs: 1.2 10*3/uL (ref 0.7–4.0)
MCH: 32.4 pg (ref 26.0–34.0)
MCHC: 32.6 g/dL (ref 30.0–36.0)
MCV: 99.4 fL (ref 80.0–100.0)
Monocytes Absolute: 0.6 10*3/uL (ref 0.1–1.0)
Monocytes Relative: 9 %
Neutro Abs: 4.2 10*3/uL (ref 1.7–7.7)
Neutrophils Relative %: 70 %
Platelets: 158 10*3/uL (ref 150–400)
RBC: 3.52 MIL/uL — ABNORMAL LOW (ref 4.22–5.81)
RDW: 13.9 % (ref 11.5–15.5)
WBC: 6 10*3/uL (ref 4.0–10.5)
nRBC: 0 % (ref 0.0–0.2)

## 2020-03-11 LAB — IMMUNOFIXATION, URINE

## 2020-03-11 MED ORDER — GELATIN ABSORBABLE 12-7 MM EX MISC
CUTANEOUS | Status: AC
  Filled 2020-03-11: qty 1

## 2020-03-11 MED ORDER — MIDAZOLAM HCL 2 MG/2ML IJ SOLN
INTRAMUSCULAR | Status: AC | PRN
  Administered 2020-03-11: 1 mg via INTRAVENOUS

## 2020-03-11 MED ORDER — LIDOCAINE HCL 1 % IJ SOLN
INTRAMUSCULAR | Status: AC
  Filled 2020-03-11: qty 20

## 2020-03-11 MED ORDER — MIDAZOLAM HCL 2 MG/2ML IJ SOLN
INTRAMUSCULAR | Status: AC
  Filled 2020-03-11: qty 4

## 2020-03-11 MED ORDER — LIDOCAINE HCL (PF) 1 % IJ SOLN
INTRAMUSCULAR | Status: AC | PRN
  Administered 2020-03-11: 5 mL

## 2020-03-11 MED ORDER — FENTANYL CITRATE (PF) 100 MCG/2ML IJ SOLN
INTRAMUSCULAR | Status: AC | PRN
  Administered 2020-03-11: 50 ug via INTRAVENOUS

## 2020-03-11 MED ORDER — FENTANYL CITRATE (PF) 100 MCG/2ML IJ SOLN
INTRAMUSCULAR | Status: AC
  Filled 2020-03-11: qty 2

## 2020-03-11 NOTE — Procedures (Signed)
Interventional Radiology Procedure Note  Procedure:  [place] LEFT RENAL BX  Complications: None  Estimated Blood Loss: MIN  Findings: 16 G CORES X 2

## 2020-03-11 NOTE — Progress Notes (Signed)
Patient ID: Alvin Ingram, male   DOB: 19-May-1936, 84 y.o.   MRN: [id number] S: No new complaints this am O:BP 139/84 (BP Location: Left Arm)   Pulse 76   Temp (!) 97.5 F (36.4 C) (Oral)   Resp 16   Ht 6\' 1"  (1.854 m)   Wt 66 kg   SpO2 98%   BMI 19.20 kg/m   Intake/Output Summary (Last 24 hours) at 03/11/2020 0837 Last data filed at 03/11/2020 4492 Gross per 24 hour  Intake 240 ml  Output 450 ml  Net -210 ml   Intake/Output: I/O last 3 completed shifts: In: 240 [P.O.:240] Out: 975 [Urine:975]  Intake/Output this shift:  No intake/output data recorded. Weight change:  Gen: cachectic AAM in NAD CVS: no rub Resp: cta Abd: +BS, soft, NT/ND Ext: 1+ lower extremity edema  Recent Labs  Lab 03/05/20 0524 03/06/20 0547 03/07/20 0548 03/08/20 0521 03/09/20 0559 03/10/20 0524 03/11/20 0552  NA 143 141 140 141 141  143 144 145  146*  K 4.1 4.2 3.5 3.4* 3.7  3.8 4.2 3.9  3.9  CL 106 109 106 101 105  106 107 108  109  CO2 21* 18* 25 27 24  24 27 29  26   GLUCOSE 81 81 138* 257* 93  93 96 103*  105*  BUN 67* 76* 83* 84* 80*  79* 81* 81*  83*  CREATININE 3.30* 3.54* 3.81* 3.49* 3.48*  3.48* 3.76* 3.67*  3.63*  ALBUMIN 1.9* 1.9* 1.6* 1.9* 1.9*  1.9* 1.8* 1.7*  CALCIUM 8.6* 8.5* 7.7* 7.8* 8.4*  8.4* 8.1* 8.2*  8.2*  PHOS 5.1* 6.0* 5.0* 4.6 4.7*  4.6 4.9* 4.1  AST 21 19 25 23 23 22   --   ALT 36 32 28 27 29 28   --    Liver Function Tests: Recent Labs  Lab 03/08/20 0521 03/08/20 0521 03/09/20 0559 03/10/20 0524 03/11/20 0552  AST 23  --  23 22  --   ALT 27  --  29 28  --   ALKPHOS 115  --  119 115  --   BILITOT 0.3  --  0.5 0.3  --   PROT 3.8*  --  4.1* 4.0*  --   ALBUMIN 1.9*   < > 1.9*  1.9* 1.8* 1.7*   < > = values in this interval not displayed.   No results for input(s): LIPASE, AMYLASE in the last 168 hours. No results for input(s): AMMONIA in the last 168 hours. CBC: Recent Labs  Lab 03/07/20 0548 03/07/20 0548 03/08/20 0521 03/08/20 0521  03/09/20 0559 03/10/20 0524 03/11/20 0552  WBC 6.4   < > 6.4   < > 6.7 6.3 6.0  NEUTROABS 4.7   < > 4.8   < > 4.7 4.2 4.2  HGB 11.1*   < > 11.6*   < > 12.9* 12.6* 11.4*  HCT 32.7*   < > 34.5*   < > 38.5* 38.0* 35.0*  MCV 96.5  --  96.9  --  97.2 98.4 99.4  PLT 129*   < > 134*   < > 133* 140* 158   < > = values in this interval not displayed.   Cardiac Enzymes: Recent Labs  Lab 03/08/20 1402  CKTOTAL 38*   CBG: No results for input(s): GLUCAP in the last 168 hours.  Iron Studies: No results for input(s): IRON, TIBC, TRANSFERRIN, FERRITIN in the last 72 hours. Studies/Results: No results found. Marland Kitchen aspirin EC  81 mg  Oral Daily  . buPROPion  150 mg Oral Daily  . feeding supplement (ENSURE ENLIVE)  237 mL Oral BID BM  . feeding supplement (NEPRO CARB STEADY)  237 mL Oral Q24H  . fentaNYL      . gelatin adsorbable      . heparin  5,000 Units Subcutaneous Q8H  . lidocaine      . midazolam      . mirtazapine  15 mg Oral QHS  . multivitamin with minerals  1 tablet Oral Daily  . pantoprazole  40 mg Oral BID  . polyethylene glycol  17 g Oral BID  . senna-docusate  2 tablet Oral BID  . tamsulosin  0.4 mg Oral QPC supper    BMET    Component Value Date/Time   NA 145 03/11/2020 0552   NA 146 (H) 03/11/2020 0552   K 3.9 03/11/2020 0552   K 3.9 03/11/2020 0552   CL 108 03/11/2020 0552   CL 109 03/11/2020 0552   CO2 29 03/11/2020 0552   CO2 26 03/11/2020 0552   GLUCOSE 103 (H) 03/11/2020 0552   GLUCOSE 105 (H) 03/11/2020 0552   GLUCOSE 98 12/06/2006 1059   BUN 81 (H) 03/11/2020 0552   BUN 83 (H) 03/11/2020 0552   CREATININE 3.67 (H) 03/11/2020 0552   CREATININE 3.63 (H) 03/11/2020 0552   CALCIUM 8.2 (L) 03/11/2020 0552   CALCIUM 8.2 (L) 03/11/2020 0552   GFRNONAA 15 (L) 03/11/2020 0552   GFRNONAA 15 (L) 03/11/2020 0552   GFRAA 17 (L) 03/11/2020 0552   GFRAA 17 (L) 03/11/2020 0552   CBC    Component Value Date/Time   WBC 6.0 03/11/2020 0552   RBC 3.52 (L) 03/11/2020  0552   HGB 11.4 (L) 03/11/2020 0552   HCT 35.0 (L) 03/11/2020 0552   PLT 158 03/11/2020 0552   MCV 99.4 03/11/2020 0552   MCH 32.4 03/11/2020 0552   MCHC 32.6 03/11/2020 0552   RDW 13.9 03/11/2020 0552   LYMPHSABS 1.2 03/11/2020 0552   MONOABS 0.6 03/11/2020 0552   EOSABS 0.0 03/11/2020 0552   BASOSABS 0.0 03/11/2020 0552   Assessment/Plan:  1. AKI/CKD stage 3- has been progressive since September 2020. ARB held in January after episode of volume depletion and AKI/CKD. Slight improvement of Scr today. Mild uremic symptoms with anorexia and nausea. No asterixis. No urgent indication for dialysis at this time but we did discuss modalities of RRT including hemo and peritoneal dialysis. He is not sure if he would be interested. I have asked the RN to show him educational videos of dialysis.  1. He may have some ATN due to nephrotic range proteinuria 2. Serologies and complements negativeas well as SPEP/ligh chains 3. Awaiting results ofUPEP  4. 24 hour urine for protein significant for >3,000 mg. He is amenable to proceed with renal biopsy today.  Appreciate IR assistance. Hopefully we can find something treatable as his kidney function has worsened over the past 6-7 months. 5. No indication for dialysis at this time.  6. He refused to discuss dialysis again today. 2. Proteinuria and possible nephrotic syndrome- as above. 3. FTT- EGD with gastritis and esophagitis. 4. Depression- psych following. Medication changes made. 5. Hypoalbuminemia- possible nephrotic syndrome as well as protein and calorie malnutrition, severe. 6. HTN- stable. 7. Enlarged prostate with calcifications. Normal psa 8. Disposition- poor prognosis given his advanced age and poor functional and nutritional status. Palliative care consulted and goals of care discussions ongoing.   Donetta Potts, MD  [employer] [phone]

## 2020-03-11 NOTE — Progress Notes (Signed)
Patient up in the chair

## 2020-03-11 NOTE — Progress Notes (Signed)
PROGRESS NOTE    Alvin Ingram  EHM:[id number] DOB: 09/09/1936 DOA: 02/22/2020 PCP: Binnie Rail, MD   Brief Narrative Alvin Ingram a 83/M w/ BPHfollowed by urology, hyperlipidemia and GERD and other comorbidities who presented to [facility] ED as advised by his niecewith concerns fordecreased p.o. intake, suspected uncontrolled depression, weight loss,and generalizedweakness. Evaluated by his PCP about 2 weeksprior-CT of his abdomen in February (01/02/20)showed extensive sigmoid diverticulosis and a15 mm hypodense lesion of the distal body of the pancreas that was indeterminate. MRI abdomen (02/25/20) showeda cystic lesion of the pancreas -my partners d/w patient's niece via phone and she was concerned that he might be depressed, recently lost his girlfriend, he admitted to feeling depressed without suicidal ideation. Seen by psych with recommendation to DC Elavil and start Zoloft. Appetite remained poor. Evaluated by PT OT with recommendation for SNF with 24-hour assistance/supervision. Receptive to going to SNF.   -Then developed Nausea and vomiting, , GI was consulted, EGD  4/5 showed esophagitis and hemorrhagic gastritis.    Path was notable for mild reactive gastropathy -Also seen by ENT due to concern for hoarseness in the setting of significant weight loss in a former smoker.  Laryngoscopy was unremarkable as well as CT neck -Subsequently he was noted to have worsening kidney function  -Seen by nephrology, they were concerned for nephrotic syndrome, renal biopsy performed 4/15  Assessment & Plan:   Failure to thrive in adult/ Severe weight loss/ Depression -Presented with poor oral intake, poor appetite,weight loss, uncontrolled depression -Malignancy work-up unremarkable so far -Seen by psychiatry, they started Zoloft and discontinued Elavil -Seen by dietitian, encouraged increase oral protein intake -Also followed by palliative care, patient refused to have any  discussions with them, palliative team recommended outpatient follow-up with palliative at SNF -Patient is very depressed and is not motivated and only wants to sleep, p.o. intake is very poor -Psychiatry reconsulted and subsequently discontinued sertraline and added Wellbutrin 150 mg p.o. daily for depression and mirtazapine 50 mg p.o. nightly for depression and insomnia and appetite stimulation -Continues to have poor appetite, nausea, poor motivation to get out of bed,  Sit up etc  AKI on CKD IV Severe hypoalbuminemia ?  Nephrotic syndrome -Presented with creatinine 3.24, proteuniuria -Baseline creatinine 2 with GFR 38 -BUN/creatinine remained relatively stable since admission however subsequently continued to worsen in the last 4days, creatinine up to 3.7 now  -Appreciate nephrology consult, concern for nephrotic syndrome, renal biopsy performed today in radiology -Patient is not felt to be a good dialysis candidate -Urine protein greater than 3 g, UPEP pending, serologies and complements, SPEP negative -Monitor urine output and kidney function  Esophagitis/Hemorrhagic Gastritis -Continue PPI twice daily   chronic hoarseness  -CT neck and laryngoscopic exam was unremarkable by ENT  -ENT suspects hoarseness may be related to GERD  15 mm low attenuating lesion with fluid attenuation along the posterior distal body of the pancreas/B/L renal cysts -MRI shows cystic lesion of the pancreas, -Recommend surveillance withrepeat imagings in 2 years.  Resolved Hypovolemic hypernatremia with iV fluid -resolved with hydration   Chronic Diastolic CHF -Last 2D echo done on 12/17/2019 showed LVEF 65-70% With grade 1 diastolic dysfunction -Did receive Lasix in the early part of this admission which was subsequently stopped  Orthostatic Hypotension -Following diuresis, Lasix subsequently discontinued and he was given fluid bolus -No monitor  BPH -Continue Flomax, PSA was  1.8  Ambulatory dysfunction with frequent falls -Has had 3 falls at home within the last 2 weeks  per his niece -Lives alone, has not been able to appropriately take care of himself per his niece. -Continue PT OT with assistance and fall precautions. 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 -Fall Precautions -Follow up as an outpatient and continue PT/OT efforts  Normocytic Anemia  -stable  Thrombocytopenia -mild, stable  DVT prophylaxis: Heparin subcutaneous Code Status: Full code Family Communication: No family at bedside, d/w neice M.Hunter 4/14 Disposition Plan: Plan for SNF, patient is from, disposition complicated by severe ongoing failure to thrive, minimal oral intake, and now worsening kidney function with nephrotic syndrome  Consultants:   Gastroenterology  Palliative Care Medicine  ENT  Nephrology; reconsulted  Psychiatry    Procedures:  EGD 4/5 Impression:               - Z-line regular, 42 cm from the incisors.                           - LA Grade C esophagitis with no bleeding. Biopsied.                           - Gastritis with hemorrhage. Biopsied.                           - Extrinsic compression in the gastric antrum.                           - Normal duodenal bulb, first portion of the                            duodenum and second portion of the duodenum.   Antimicrobials:  Anti-infectives (From admission, onward)   None     Subjective: -Just back from renal biopsy, angry and irritated, refuses to answer much questions, tells me that he did not eat very much yesterday  Objective: Vitals:   03/11/20 0845 03/11/20 0850 03/11/20 0914 03/11/20 1340  BP: 108/64 114/65 124/69 96/62  Pulse: 75 76 77 79  Resp: 10 10 14 14   Temp:   (!) 97.4 F (36.3 C) 97.6 F (36.4 C)  TempSrc:    Oral Oral  SpO2: 98% 98% 98% 98%  Weight:      Height:        Intake/Output Summary (Last 24 hours) at 03/11/2020 1410 Last data filed at 03/11/2020 2683 Gross per 24 hour  Intake 240 ml  Output 450 ml  Net -210 ml   Filed Weights   03/01/20 0547 03/01/20 1204 03/02/20 0500  Weight: 66.2 kg 66.2 kg 66 kg   Examination: Physical Exam:  Gen: Elderly frail chronically ill male laying in bed, eyes open, reclusive, refuses to interact much, awake and alert HEENT: No JVD Lungs: Decreased breath sounds both bases CVS: S1-S2, regular rate rhythm Abd: soft, Non tender, non distended, BS present Extremities: 2+ edema  skin: no new rashes on exposed skin   Data Reviewed: I have personally reviewed following labs and imaging studies  CBC: Recent Labs  Lab 03/07/20 0548 03/08/20 0521 03/09/20 0559 03/10/20 0524 03/11/20 0552  WBC 6.4 6.4 6.7 6.3 6.0  NEUTROABS 4.7 4.8 4.7 4.2 4.2  HGB  11.1* 11.6* 12.9* 12.6* 11.4*  HCT 32.7* 34.5* 38.5* 38.0* 35.0*  MCV 96.5 96.9 97.2 98.4 99.4  PLT 129* 134* 133* 140* 003   Basic Metabolic Panel: Recent Labs  Lab 03/06/20 0547 03/06/20 0547 03/07/20 0548 03/08/20 0521 03/09/20 0559 03/10/20 0524 03/11/20 0552  NA 141   < > 140 141 141  143 144 145  146*  K 4.2   < > 3.5 3.4* 3.7  3.8 4.2 3.9  3.9  CL 109   < > 106 101 105  106 107 108  109  CO2 18*   < > 25 27 24  24 27 29  26   GLUCOSE 81   < > 138* 257* 93  93 96 103*  105*  BUN 76*   < > 83* 84* 80*  79* 81* 81*  83*  CREATININE 3.54*   < > 3.81* 3.49* 3.48*  3.48* 3.76* 3.67*  3.63*  CALCIUM 8.5*   < > 7.7* 7.8* 8.4*  8.4* 8.1* 8.2*  8.2*  MG 2.4  --  2.4 2.6* 2.6* 2.6*  --   PHOS 6.0*   < > 5.0* 4.6 4.7*  4.6 4.9* 4.1   < > = values in this interval not displayed.   GFR: Estimated Creatinine Clearance: 14.7 mL/min (A) (by C-G formula based on SCr of 3.67 mg/dL (H)). Liver Function Tests: Recent Labs  Lab 03/06/20 0547 03/06/20 0547 03/07/20 0548  03/08/20 0521 03/09/20 0559 03/10/20 0524 03/11/20 0552  AST 19  --  25 23 23 22   --   ALT 32  --  28 27 29 28   --   ALKPHOS 114  --  112 115 119 115  --   BILITOT 0.7  --  0.4 0.3 0.5 0.3  --   PROT 4.2*  --  3.6* 3.8* 4.1* 4.0*  --   ALBUMIN 1.9*   < > 1.6* 1.9* 1.9*  1.9* 1.8* 1.7*   < > = values in this interval not displayed.   No results for input(s): LIPASE, AMYLASE in the last 168 hours. No results for input(s): AMMONIA in the last 168 hours. Coagulation Profile: Recent Labs  Lab 03/11/20 0552  INR 1.0   Cardiac Enzymes: Recent Labs  Lab 03/08/20 1402  CKTOTAL 38*   BNP (last 3 results) Recent Labs    12/29/19 1639 02/13/20 1431  PROBNP 224.0* 84.0   HbA1C: No results for input(s): HGBA1C in the last 72 hours. CBG: No results for input(s): GLUCAP in the last 168 hours. Lipid Profile: No results for input(s): CHOL, HDL, LDLCALC, TRIG, CHOLHDL, LDLDIRECT in the last 72 hours. Thyroid Function Tests: No results for input(s): TSH, T4TOTAL, FREET4, T3FREE, THYROIDAB in the last 72 hours. Anemia Panel: No results for input(s): VITAMINB12, FOLATE, FERRITIN, TIBC, IRON, RETICCTPCT in the last 72 hours. Sepsis Labs: No results for input(s): PROCALCITON, LATICACIDVEN in the last 168 hours.  No results found for this or any previous visit (from the past 240 hour(s)).   RN Pressure Injury Documentation:     Estimated body mass index is 19.2 kg/m as calculated from the following:   Height as of this encounter: 6\' 1"  (1.854 m).   Weight as of this encounter: 66 kg.  Malnutrition Type:  Nutrition Problem: Severe Malnutrition Etiology: acute illness(unknown etiology)   Malnutrition Characteristics:  Signs/Symptoms: moderate fat depletion, moderate muscle depletion, severe muscle depletion, percent weight loss Percent weight loss: 20 %   Nutrition Interventions:  Interventions: Ensure Enlive (  each supplement provides 350kcal and 20 grams of protein),  Magic cup, MVI   Radiology Studies: US BIOPSY (KIDNEY)  Result Date: 03/11/2020 INDICATION: Proteinuria, nephrotic syndrome, renal failure EXAM: ULTRASOUND GUIDED CORE BIOPSY OF LEFT RENAL CORTEX MEDICATIONS: 1% LIDOCAINE LOCAL ANESTHESIA/SEDATION: Versed 1.0mg  IV; Fentanyl 81mcg IV; Moderate Sedation Time:  10 MINUTES The patient was continuously monitored during the procedure by the interventional radiology nurse under my direct supervision. FLUOROSCOPY TIME:  Fluoroscopy Time: NONE. COMPLICATIONS: None immediate. PROCEDURE: The procedure, risks, benefits, and alternatives were explained to the patient. Questions regarding the procedure were encouraged and answered. The patient understands and consents to the procedure. The left flank was prepped with ChloraPrep in a sterile fashion, and a sterile drape was applied covering the operative field. A sterile gown and sterile gloves were used for the procedure. Local anesthesia was provided with 1% Lidocaine. Preliminary ultrasound performed. The left kidney was localized and marked. Under sterile conditions and local anesthesia, the 15 gauge guide was advanced to the left kidney mid upper pole cortex directed away from the renal cyst and the bowel inferiorly. Needle position confirmed with ultrasound. 2 16 gauge core biopsies obtained of the cortex. Needle position reconfirmed with imaging. Samples were intact and non fragmented. These were placed in saline. Needle tract occluded with Gel-Foam. Postprocedure imaging demonstrates no hemorrhage or hematoma. Patient tolerated biopsy well. FINDINGS: Imaging confirms needle placement to the left renal cortex for core biopsy IMPRESSION: Successful ultrasound left renal cortex 16 gauge core biopsy Electronically Signed   By: Jerilynn Mages.  Shick M.D.   On: 03/11/2020 10:06   Scheduled Meds: . aspirin EC  81 mg Oral Daily  . buPROPion  150 mg Oral Daily  . feeding supplement (ENSURE ENLIVE)  237 mL Oral BID BM  . feeding  supplement (NEPRO CARB STEADY)  237 mL Oral Q24H  . fentaNYL      . gelatin adsorbable      . heparin  5,000 Units Subcutaneous Q8H  . lidocaine      . midazolam      . mirtazapine  15 mg Oral QHS  . multivitamin with minerals  1 tablet Oral Daily  . pantoprazole  40 mg Oral BID  . polyethylene glycol  17 g Oral BID  . senna-docusate  2 tablet Oral BID  . tamsulosin  0.4 mg Oral QPC supper   Continuous Infusions:   LOS: 16 days   Domenic Polite, MD [facility]

## 2020-03-12 LAB — CBC
HCT: 31.4 % — ABNORMAL LOW (ref 39.0–52.0)
Hemoglobin: 10.4 g/dL — ABNORMAL LOW (ref 13.0–17.0)
MCH: 33.1 pg (ref 26.0–34.0)
MCHC: 33.1 g/dL (ref 30.0–36.0)
MCV: 100 fL (ref 80.0–100.0)
Platelets: 129 10*3/uL — ABNORMAL LOW (ref 150–400)
RBC: 3.14 MIL/uL — ABNORMAL LOW (ref 4.22–5.81)
RDW: 14.1 % (ref 11.5–15.5)
WBC: 6.8 10*3/uL (ref 4.0–10.5)
nRBC: 0 % (ref 0.0–0.2)

## 2020-03-12 LAB — RENAL FUNCTION PANEL
Albumin: 1.8 g/dL — ABNORMAL LOW (ref 3.5–5.0)
Anion gap: 9 (ref 5–15)
BUN: 87 mg/dL — ABNORMAL HIGH (ref 8–23)
CO2: 28 mmol/L (ref 22–32)
Calcium: 8.3 mg/dL — ABNORMAL LOW (ref 8.9–10.3)
Chloride: 110 mmol/L (ref 98–111)
Creatinine, Ser: 4.12 mg/dL — ABNORMAL HIGH (ref 0.61–1.24)
GFR calc Af Amer: 15 mL/min — ABNORMAL LOW (ref >60)
GFR calc non Af Amer: 13 mL/min — ABNORMAL LOW (ref >60)
Glucose, Bld: 110 mg/dL — ABNORMAL HIGH (ref 70–99)
Phosphorus: 4.4 mg/dL (ref 2.5–4.6)
Potassium: 4.7 mmol/L (ref 3.5–5.1)
Sodium: 147 mmol/L — ABNORMAL HIGH (ref 135–145)

## 2020-03-12 LAB — BLOOD GAS, ARTERIAL
Acid-Base Excess: 3.7 mmol/L — ABNORMAL HIGH (ref 0.0–2.0)
Bicarbonate: 27 mmol/L (ref 20.0–28.0)
FIO2: 100
O2 Saturation: >100 %
Patient temperature: 98.6
pCO2 arterial: 37.3 mmHg (ref 32.0–48.0)
pH, Arterial: 7.473 — ABNORMAL HIGH (ref 7.350–7.450)
pO2, Arterial: 401 mmHg — ABNORMAL HIGH (ref 83.0–108.0)

## 2020-03-12 LAB — GLUCOSE, CAPILLARY: Glucose-Capillary: 104 mg/dL — ABNORMAL HIGH (ref 70–99)

## 2020-03-12 LAB — MRSA PCR SCREENING: MRSA by PCR: NEGATIVE

## 2020-03-12 MED ORDER — ORAL CARE MOUTH RINSE
15.0000 mL | Freq: Two times a day (BID) | OROMUCOSAL | Status: DC
  Administered 2020-03-12 – 2020-03-16 (×6): 15 mL via OROMUCOSAL

## 2020-03-12 MED ORDER — CHLORHEXIDINE GLUCONATE CLOTH 2 % EX PADS
6.0000 | MEDICATED_PAD | Freq: Every day | CUTANEOUS | Status: DC
  Administered 2020-03-12 – 2020-03-16 (×2): 6 via TOPICAL

## 2020-03-12 MED FILL — Medication: Qty: 1 | Status: AC

## 2020-03-12 NOTE — Progress Notes (Signed)
Pt became unresponsive, code & RR called to bedside. Pt did have a pulse, then did respond, but looked like agonal/guppy breathing at one point. Pt was bagged for support until team arrived. Decision by attending to move Pt to stepdown. Report called & Pt TX to 1240.

## 2020-03-12 NOTE — Progress Notes (Addendum)
Patient ID: Alvin Ingram, male   DOB: 19-Jun-1936, 84 y.o.   MRN: [id number] S: Transferred to step down unit after he became unresponsive with agonal breathing this morning after PT.  Now resting comfortably and breathing on his own.  He again refuses to discuss dialysis or his worsening renal function.  He did tell the Case Manager that he did not want dialysis.   O:BP 111/66 (BP Location: Right Arm)   Pulse 82   Temp (!) 97 F (36.1 C) (Axillary)   Resp 17   Ht 6\' 1"  (1.854 m)   Wt 66 kg   SpO2 98%   BMI 19.20 kg/m   Intake/Output Summary (Last 24 hours) at 03/12/2020 1322 Last data filed at 03/12/2020 0600 Gross per 24 hour  Intake 240 ml  Output 650 ml  Net -410 ml   Intake/Output: I/O last 3 completed shifts: In: 480 [P.O.:480] Out: 1100 [Urine:1100]  Intake/Output this shift:  No intake/output data recorded. Weight change:  Gen: frail, cachectic male wearing oxygen mask in NAD CVS: no rub Resp: cta Abd: +BS, soft, NT/ND Ext: 2+ edema   Recent Labs  Lab 03/06/20 0547 03/07/20 0548 03/08/20 0521 03/09/20 0559 03/10/20 0524 03/11/20 0552 03/12/20 0513  NA 141 140 141 141  143 144 145  146* 147*  K 4.2 3.5 3.4* 3.7  3.8 4.2 3.9  3.9 4.7  CL 109 106 101 105  106 107 108  109 110  CO2 18* 25 27 24  24 27 29  26 28   GLUCOSE 81 138* 257* 93  93 96 103*  105* 110*  BUN 76* 83* 84* 80*  79* 81* 81*  83* 87*  CREATININE 3.54* 3.81* 3.49* 3.48*  3.48* 3.76* 3.67*  3.63* 4.12*  ALBUMIN 1.9* 1.6* 1.9* 1.9*  1.9* 1.8* 1.7* 1.8*  CALCIUM 8.5* 7.7* 7.8* 8.4*  8.4* 8.1* 8.2*  8.2* 8.3*  PHOS 6.0* 5.0* 4.6 4.7*  4.6 4.9* 4.1 4.4  AST 19 25 23 23 22   --   --   ALT 32 28 27 29 28   --   --    Liver Function Tests: Recent Labs  Lab 03/08/20 0521 03/08/20 0521 03/09/20 0559 03/09/20 0559 03/10/20 0524 03/11/20 0552 03/12/20 0513  AST 23  --  23  --  22  --   --   ALT 27  --  29  --  28  --   --   ALKPHOS 115  --  119  --  115  --   --   BILITOT 0.3  --   0.5  --  0.3  --   --   PROT 3.8*  --  4.1*  --  4.0*  --   --   ALBUMIN 1.9*   < > 1.9*  1.9*   < > 1.8* 1.7* 1.8*   < > = values in this interval not displayed.   No results for input(s): LIPASE, AMYLASE in the last 168 hours. No results for input(s): AMMONIA in the last 168 hours. CBC: Recent Labs  Lab 03/08/20 0521 03/08/20 0521 03/09/20 0559 03/09/20 0559 03/10/20 0524 03/11/20 0552 03/12/20 1215  WBC 6.4   < > 6.7   < > 6.3 6.0 6.8  NEUTROABS 4.8   < > 4.7  --  4.2 4.2  --   HGB 11.6*   < > 12.9*   < > 12.6* 11.4* 10.4*  HCT 34.5*   < > 38.5*   < >  38.0* 35.0* 31.4*  MCV 96.9  --  97.2  --  98.4 99.4 100.0  PLT 134*   < > 133*   < > 140* 158 129*   < > = values in this interval not displayed.   Cardiac Enzymes: Recent Labs  Lab 03/08/20 1402  CKTOTAL 38*   CBG: Recent Labs  Lab 03/12/20 1103  GLUCAP 104*    Iron Studies: No results for input(s): IRON, TIBC, TRANSFERRIN, FERRITIN in the last 72 hours. Studies/Results: US BIOPSY (KIDNEY)  Result Date: 03/11/2020 INDICATION: Proteinuria, nephrotic syndrome, renal failure EXAM: ULTRASOUND GUIDED CORE BIOPSY OF LEFT RENAL CORTEX MEDICATIONS: 1% LIDOCAINE LOCAL ANESTHESIA/SEDATION: Versed 1.0mg  IV; Fentanyl 63mcg IV; Moderate Sedation Time:  10 MINUTES The patient was continuously monitored during the procedure by the interventional radiology nurse under my direct supervision. FLUOROSCOPY TIME:  Fluoroscopy Time: NONE. COMPLICATIONS: None immediate. PROCEDURE: The procedure, risks, benefits, and alternatives were explained to the patient. Questions regarding the procedure were encouraged and answered. The patient understands and consents to the procedure. The left flank was prepped with ChloraPrep in a sterile fashion, and a sterile drape was applied covering the operative field. A sterile gown and sterile gloves were used for the procedure. Local anesthesia was provided with 1% Lidocaine. Preliminary ultrasound performed.  The left kidney was localized and marked. Under sterile conditions and local anesthesia, the 15 gauge guide was advanced to the left kidney mid upper pole cortex directed away from the renal cyst and the bowel inferiorly. Needle position confirmed with ultrasound. 2 16 gauge core biopsies obtained of the cortex. Needle position reconfirmed with imaging. Samples were intact and non fragmented. These were placed in saline. Needle tract occluded with Gel-Foam. Postprocedure imaging demonstrates no hemorrhage or hematoma. Patient tolerated biopsy well. FINDINGS: Imaging confirms needle placement to the left renal cortex for core biopsy IMPRESSION: Successful ultrasound left renal cortex 16 gauge core biopsy Electronically Signed   By: Jerilynn Mages.  Shick M.D.   On: 03/11/2020 10:06   . aspirin EC  81 mg Oral Daily  . buPROPion  150 mg Oral Daily  . Chlorhexidine Gluconate Cloth  6 each Topical Daily  . feeding supplement (ENSURE ENLIVE)  237 mL Oral BID BM  . feeding supplement (NEPRO CARB STEADY)  237 mL Oral Q24H  . heparin  5,000 Units Subcutaneous Q8H  . mouth rinse  15 mL Mouth Rinse BID  . mirtazapine  15 mg Oral QHS  . multivitamin with minerals  1 tablet Oral Daily  . pantoprazole  40 mg Oral BID  . polyethylene glycol  17 g Oral BID  . senna-docusate  2 tablet Oral BID  . tamsulosin  0.4 mg Oral QPC supper    BMET    Component Value Date/Time   NA 147 (H) 03/12/2020 0513   K 4.7 03/12/2020 0513   CL 110 03/12/2020 0513   CO2 28 03/12/2020 0513   GLUCOSE 110 (H) 03/12/2020 0513   GLUCOSE 98 12/06/2006 1059   BUN 87 (H) 03/12/2020 0513   CREATININE 4.12 (H) 03/12/2020 0513   CALCIUM 8.3 (L) 03/12/2020 0513   GFRNONAA 13 (L) 03/12/2020 0513   GFRAA 15 (L) 03/12/2020 0513   CBC    Component Value Date/Time   WBC 6.8 03/12/2020 1215   RBC 3.14 (L) 03/12/2020 1215   HGB 10.4 (L) 03/12/2020 1215   HCT 31.4 (L) 03/12/2020 1215   PLT 129 (L) 03/12/2020 1215   MCV 100.0 03/12/2020 1215    MCH  33.1 03/12/2020 1215   MCHC 33.1 03/12/2020 1215   RDW 14.1 03/12/2020 1215   LYMPHSABS 1.2 03/11/2020 0552   MONOABS 0.6 03/11/2020 0552   EOSABS 0.0 03/11/2020 0552   BASOSABS 0.0 03/11/2020 0552     Assessment/Plan:  1. AKI/CKD stage 3- has been progressive since September 2020. ARB held in January after episode of volume depletion and AKI/CKD. Slight improvement of Scr today. Mild uremic symptoms with anorexia and nausea. No asterixis. No urgent indication for dialysis at this time but we did discuss modalities of RRT including hemo and peritoneal dialysis. He is not sure if he would be interested. I have asked the RN to show him educational videos of dialysis.  1. He may have some ATN due to nephrotic range proteinuria 2. Serologies and complements negativeas well as SPEP/ligh chains 3. Awaiting results ofUPEP  4. 24 hour urine for proteinsignificant for >3,000 mg.  5. S/p renal biopsy on 03/11/20.  Appreciate IR assistance. Hopefully we can find something treatable as his kidney function has worsened over the past 6-7 months. 6. He refused to discuss dialysis again today but did tell the case manager that he did not want dialysis.  I concur since he is a poor candidate for outpatient dialysis given his progressive worsening nutritional and functional status as well as advanced age.  It would not improve his quality of life. 2. Proteinuria and possible nephrotic syndrome- as above. 3. FTT- EGD with gastritis and esophagitis. 4. Acute respiratory distress/agonal breathing- unclear what was cause of episode.  Currently breathing on his own and stable.  5. Depression- psych following. Medication changes made. 6. Hypoalbuminemia- possible nephrotic syndrome as well as protein and calorie malnutrition, severe. 7. HTN- stable. 8. Enlarged prostate with calcifications. Normalpsa 9. Disposition- poor prognosis given his advanced age and poor functional and nutritional  status. Palliative care consulted and goals of care discussions ongoing. He is amenable to SNF placement.  Does not want dialysis.  Donetta Potts, MD [employer] [phone]

## 2020-03-12 NOTE — TOC Progression Note (Signed)
Transition of Care [facility]) - Progression Note    Patient Details  Name: Zacory Fiola MRN: [id number] Date of Birth: [birth year]  Transition of Care [facility]) CM/SW Contact  Leeroy Cha, RN Phone Number: 03/12/2020, 8:47 AM  Clinical Narrative:    [facility] insurance auth good until Monday [id number]   Expected Discharge Plan: [place] Barriers to Discharge: Continued Medical Work up  Expected Discharge Plan and Services Expected Discharge Plan: [place]   Discharge Planning Services: CM Consult   Living arrangements for the past 2 months: Single Family Home Expected Discharge Date: 02/28/20                                     Social Determinants of Health (SDOH) Interventions    Readmission Risk Interventions No flowsheet data found.

## 2020-03-12 NOTE — TOC Progression Note (Signed)
Transition of Care [facility]) - Progression Note    Patient Details  Name: Alvin Ingram MRN: [id number] Date of Birth: Jan 24, 1936  Transition of Care [facility]) CM/SW Contact  Leeroy Cha, RN Phone Number: 03/12/2020, 10:04 AM  Clinical Narrative:    Spoke with patient concerning his goals of care.  He does want to be a full code. He is open to going to a snf for rehab. He does not want dialysis   Expected Discharge Plan: [place] Barriers to Discharge: Continued Medical Work up  Expected Discharge Plan and Services Expected Discharge Plan: [place]   Discharge Planning Services: CM Consult   Living arrangements for the past 2 months: Single Family Home Expected Discharge Date: 02/28/20                                     Social Determinants of Health (SDOH) Interventions    Readmission Risk Interventions No flowsheet data found.

## 2020-03-12 NOTE — Progress Notes (Signed)
Chaplain responding to medical alert CODE BLUE.  Present on unit for assessment and support as needed.  No family present at this time.

## 2020-03-12 NOTE — Progress Notes (Addendum)
PROGRESS NOTE    Marks Scalera  NOM:[id number] DOB: Nov 04, 1936 DOA: 02/22/2020 PCP: Binnie Rail, MD  Brief Narrative [name] a 83/M w/ BPHfollowed by urology, hyperlipidemia and GERD and other comorbidities who presented to [facility] ED as advised by his niecewith concerns fordecreased p.o. intake, suspected uncontrolled depression, weight loss,and generalizedweakness. Evaluated by his PCP about 2 weeksprior-CT of his abdomen in February (01/02/20)showed extensive sigmoid diverticulosis and a15 mm hypodense lesion of the distal body of the pancreas that was indeterminate. MRI abdomen (02/25/20) showeda cystic lesion of the pancreas -my partners d/w patient's niece via phone and she was concerned that he might be depressed, recently lost his girlfriend, he admitted to feeling depressed without suicidal ideation. Seen by psych with recommendation to DC Elavil and start Zoloft. Appetite remained poor. Evaluated by PT OT with recommendation for SNF with 24-hour assistance/supervision. Receptive to going to SNF.   -Then developed Nausea and vomiting, , GI was consulted, EGD  4/5 showed esophagitis and hemorrhagic gastritis.    Path was notable for mild reactive gastropathy -Also seen by ENT due to concern for hoarseness in the setting of significant weight loss in a former smoker.  Laryngoscopy was unremarkable as well as CT neck -Subsequently he was noted to have worsening kidney function  -Seen by nephrology, they were concerned for nephrotic syndrome, renal biopsy performed 4/15 -4/16: pt became hypotensive and poorly responsive, CODE BLUE was called however patient did not lose his pulse or blood pressure -Subsequently regained consciousness  Assessment & Plan:   Failure to thrive in adult/ Severe weight loss/ Depression -Presented with poor oral intake, poor appetite,weight loss, uncontrolled depression -Malignancy work-up unremarkable so far -Seen by psychiatry, they started  Zoloft and discontinued Elavil -Seen by dietitian, encouraged increase oral protein intake -Also followed by palliative care, patient refused to have any discussions with them, palliative team recommended outpatient follow-up with palliative at SNF -Patient is very depressed and is not motivated and only wants to sleep, p.o. intake is very poor -Psychiatry reconsulted and subsequently discontinued sertraline and added Wellbutrin 150 mg p.o. daily for depression and mirtazapine 50 mg p.o. nightly for depression and insomnia and appetite stimulation -Continues to have poor appetite, nausea, poor motivation to get out of bed,  Sit up etc  AKI on CKD IV Severe hypoalbuminemia ?  Nephrotic syndrome -Presented with creatinine 3.24, proteuniuria -Baseline creatinine 2 with GFR 38 -BUN/creatinine remained relatively stable since admission however subsequently continued to worsen in the last 4days, creatinine up to 3.7 now  -Appreciate nephrology consult, concern for nephrotic syndrome, renal biopsy performed 4/15 in radiology -Patient is not felt to be a good dialysis candidate -Urine protein greater than 3 g, UPEP pending, serologies and complements, SPEP negative -Unfortunately kidney function continues to decline, patient has refused hemodialysis, per nephrology has not a good long-term dialysis candidate  Esophagitis/Hemorrhagic Gastritis -Continue PPI twice daily   chronic hoarseness  -CT neck and laryngoscopic exam was unremarkable by ENT  -ENT suspects hoarseness may be related to GERD  15 mm low attenuating lesion with fluid attenuation along the posterior distal body of the pancreas/B/L renal cysts -MRI shows cystic lesion of the pancreas, -Recommend surveillance withrepeat imagings in 2 years.  Resolved Hypovolemic hypernatremia with iV fluid -resolved with hydration   Chronic Diastolic CHF -Last 2D echo done on 12/17/2019 showed LVEF 65-70% With grade 1 diastolic  dysfunction -Did receive Lasix in the early part of this admission which was subsequently stopped  Orthostatic Hypotension -Following  diuresis, Lasix subsequently discontinued and he was given fluid bolus -No monitor  BPH -Continue Flomax, PSA was 1.8  Ambulatory dysfunction with frequent falls -Has had 3 falls at home within the last 2 weeks per his niece -Lives alone, has not been able to appropriately take care of himself per his niece. -Continue PT OT with assistance and fall precautions. 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 -Fall Precautions -Follow up as an outpatient and continue PT/OT efforts  Normocytic Anemia  -stable  Thrombocytopenia -mild, stable  DVT prophylaxis: Heparin subcutaneous Code Status: I discussed code status w/ patient again given ongoing decline, he wanted me to d/w family, now DNR, discussed code status again w/ niece and next of kin [name] 4/16,  Family Communication: No family at bedside, d/w neice M.Hunter 4/14, 4/16 Disposition Plan: Plan for SNF, patient is from, disposition complicated by severe ongoing failure to thrive, minimal oral intake, and now worsening kidney function with nephrotic syndrome -If kidney function stabilizes plan is for rehab if not will need hospice  Consultants:   Gastroenterology  Palliative Care Medicine  ENT  Nephrology; reconsulted  Psychiatry    Procedures:  EGD 4/5 Impression:               - Z-line regular, 42 cm from the incisors.                           - LA Grade C esophagitis with no bleeding. Biopsied.                           - Gastritis with hemorrhage. Biopsied.                           - Extrinsic compression in the gastric antrum.                           - Normal duodenal bulb, first portion of the                             duodenum and second portion of the duodenum.   Antimicrobials:  Anti-infectives (From admission, onward)   None     Subjective: -Had a syncopal event today when he was lifted out of bed to the chair  Objective: Vitals:   03/12/20 0632 03/12/20 1200 03/12/20 1300 03/12/20 1400  BP: 111/66 130/79 112/69 115/74  Pulse: 82 74 72 72  Resp: 17 12 (!) 9 10  Temp: 97.6 F (36.4 C) (!) 97 F (36.1 C)    TempSrc: Oral Axillary    SpO2: 98% 100% 99% 100%  Weight:      Height:        Intake/Output Summary (Last 24 hours) at 03/12/2020 1515 Last data filed at 03/12/2020 1400 Gross per 24 hour  Intake 740 ml  Output 650 ml  Net 90 ml   Filed Weights   03/01/20 0547 03/01/20 1204 03/02/20 0500  Weight: 66.2 kg 66.2 kg 66 kg   Examination: Physical Exam:  Gen: Elderly frail chronically ill male laying in bed, eyes open, reclusive, refuses to interact much, awake and alert HEENT: No JVD Lungs: Decreased  breath sounds both bases CVS: S1-S2, regular rate rhythm Abd: soft, Non tender, non distended, BS present Extremities: 2+ edema  skin: no new rashes on exposed skin   Data Reviewed: I have personally reviewed following labs and imaging studies  CBC: Recent Labs  Lab 03/07/20 0548 03/07/20 0548 03/08/20 0521 03/09/20 0559 03/10/20 0524 03/11/20 0552 03/12/20 1215  WBC 6.4   < > 6.4 6.7 6.3 6.0 6.8  NEUTROABS 4.7  --  4.8 4.7 4.2 4.2  --   HGB 11.1*   < > 11.6* 12.9* 12.6* 11.4* 10.4*  HCT 32.7*   < > 34.5* 38.5* 38.0* 35.0* 31.4*  MCV 96.5   < > 96.9 97.2 98.4 99.4 100.0  PLT 129*   < > 134* 133* 140* 158 129*   < > = values in this interval not displayed.   Basic Metabolic Panel: Recent Labs  Lab 03/06/20 0547 03/06/20 0547 03/07/20 0548 03/07/20 0548 03/08/20 0521 03/09/20 0559 03/10/20 0524 03/11/20 0552 03/12/20 0513  NA 141   < > 140   < > 141 141  143 144 145  146* 147*  K 4.2   < > 3.5   < > 3.4* 3.7  3.8 4.2 3.9  3.9 4.7  CL 109   < > 106    < > 101 105  106 107 108  109 110  CO2 18*   < > 25   < > 27 24  24 27 29  26 28   GLUCOSE 81   < > 138*   < > 257* 93  93 96 103*  105* 110*  BUN 76*   < > 83*   < > 84* 80*  79* 81* 81*  83* 87*  CREATININE 3.54*   < > 3.81*   < > 3.49* 3.48*  3.48* 3.76* 3.67*  3.63* 4.12*  CALCIUM 8.5*   < > 7.7*   < > 7.8* 8.4*  8.4* 8.1* 8.2*  8.2* 8.3*  MG 2.4  --  2.4  --  2.6* 2.6* 2.6*  --   --   PHOS 6.0*   < > 5.0*   < > 4.6 4.7*  4.6 4.9* 4.1 4.4   < > = values in this interval not displayed.   GFR: Estimated Creatinine Clearance: 13.1 mL/min (A) (by C-G formula based on SCr of 4.12 mg/dL (H)). Liver Function Tests: Recent Labs  Lab 03/06/20 0547 03/06/20 0547 03/07/20 0548 03/07/20 0548 03/08/20 0521 03/09/20 0559 03/10/20 0524 03/11/20 0552 03/12/20 0513  AST 19  --  25  --  23 23 22   --   --   ALT 32  --  28  --  27 29 28   --   --   ALKPHOS 114  --  112  --  115 119 115  --   --   BILITOT 0.7  --  0.4  --  0.3 0.5 0.3  --   --   PROT 4.2*  --  3.6*  --  3.8* 4.1* 4.0*  --   --   ALBUMIN 1.9*   < > 1.6*   < > 1.9* 1.9*  1.9* 1.8* 1.7* 1.8*   < > = values in this interval not displayed.   No results for input(s): LIPASE, AMYLASE in the last 168 hours. No results for input(s): AMMONIA in the last 168 hours. Coagulation Profile: Recent Labs  Lab 03/11/20 0552  INR 1.0   Cardiac Enzymes: Recent Labs  Lab 03/08/20 1402  CKTOTAL 38*   BNP (last 3 results) Recent Labs    12/29/19 1639 02/13/20 1431  PROBNP 224.0* 84.0   HbA1C: No results for input(s): HGBA1C in the last 72 hours. CBG: Recent Labs  Lab 03/12/20 1103  GLUCAP 104*   Lipid Profile: No results for input(s): CHOL, HDL, LDLCALC, TRIG, CHOLHDL, LDLDIRECT in the last 72 hours. Thyroid Function Tests: No results for input(s): TSH, T4TOTAL, FREET4, T3FREE, THYROIDAB in the last 72 hours. Anemia Panel: No results for input(s): VITAMINB12, FOLATE, FERRITIN, TIBC, IRON, RETICCTPCT in the last  72 hours. Sepsis Labs: No results for input(s): PROCALCITON, LATICACIDVEN in the last 168 hours.  Recent Results (from the past 240 hour(s))  MRSA PCR Screening     Status: None   Collection Time: 03/12/20 11:35 AM   Specimen: Nasal Mucosa; Nasopharyngeal  Result Value Ref Range Status   MRSA by PCR NEGATIVE NEGATIVE Final    Comment:        The GeneXpert MRSA Assay (FDA approved for NASAL specimens only), is one component of a comprehensive MRSA colonization surveillance program. It is not intended to diagnose MRSA infection nor to guide or monitor treatment for MRSA infections. Performed at [facility], [place] [address] [place], [place] [address]      RN Pressure Injury Documentation:     Estimated body mass index is 19.2 kg/m as calculated from the following:   Height as of this encounter: 6\' 1"  (1.854 m).   Weight as of this encounter: 66 kg.  Malnutrition Type:  Nutrition Problem: Severe Malnutrition Etiology: acute illness(unknown etiology)   Malnutrition Characteristics:  Signs/Symptoms: moderate fat depletion, moderate muscle depletion, severe muscle depletion, percent weight loss Percent weight loss: 20 %   Nutrition Interventions:  Interventions: Ensure Enlive (each supplement provides 350kcal and 20 grams of protein), Magic cup, MVI   Radiology Studies: US BIOPSY (KIDNEY)  Result Date: 03/11/2020 INDICATION: Proteinuria, nephrotic syndrome, renal failure EXAM: ULTRASOUND GUIDED CORE BIOPSY OF LEFT RENAL CORTEX MEDICATIONS: 1% LIDOCAINE LOCAL ANESTHESIA/SEDATION: Versed 1.0mg  IV; Fentanyl 1mcg IV; Moderate Sedation Time:  10 MINUTES The patient was continuously monitored during the procedure by the interventional radiology nurse under my direct supervision. FLUOROSCOPY TIME:  Fluoroscopy Time: NONE. COMPLICATIONS: None immediate. PROCEDURE: The procedure, risks, benefits, and alternatives were explained to the patient. Questions  regarding the procedure were encouraged and answered. The patient understands and consents to the procedure. The left flank was prepped with ChloraPrep in a sterile fashion, and a sterile drape was applied covering the operative field. A sterile gown and sterile gloves were used for the procedure. Local anesthesia was provided with 1% Lidocaine. Preliminary ultrasound performed. The left kidney was localized and marked. Under sterile conditions and local anesthesia, the 15 gauge guide was advanced to the left kidney mid upper pole cortex directed away from the renal cyst and the bowel inferiorly. Needle position confirmed with ultrasound. 2 16 gauge core biopsies obtained of the cortex. Needle position reconfirmed with imaging. Samples were intact and non fragmented. These were placed in saline. Needle tract occluded with Gel-Foam. Postprocedure imaging demonstrates no hemorrhage or hematoma. Patient tolerated biopsy well. FINDINGS: Imaging confirms needle placement to the left renal cortex for core biopsy IMPRESSION: Successful ultrasound left renal cortex 16 gauge core biopsy Electronically Signed   By: Jerilynn Mages.  Shick M.D.   On: 03/11/2020 10:06   Scheduled Meds: . aspirin EC  81 mg Oral Daily  . buPROPion  150 mg Oral  Daily  . Chlorhexidine Gluconate Cloth  6 each Topical Daily  . feeding supplement (ENSURE ENLIVE)  237 mL Oral BID BM  . feeding supplement (NEPRO CARB STEADY)  237 mL Oral Q24H  . heparin  5,000 Units Subcutaneous Q8H  . mouth rinse  15 mL Mouth Rinse BID  . mirtazapine  15 mg Oral QHS  . multivitamin with minerals  1 tablet Oral Daily  . pantoprazole  40 mg Oral BID  . polyethylene glycol  17 g Oral BID  . senna-docusate  2 tablet Oral BID  . tamsulosin  0.4 mg Oral QPC supper   Continuous Infusions:   LOS: 17 days   Domenic Polite, MD [facility]

## 2020-03-12 NOTE — Progress Notes (Signed)
PT Cancellation Note  Patient Details Name: Alvin Ingram MRN: [id number] DOB: 1936/08/16   Cancelled Treatment:     Pt transferred to Step Down due to earlier event when staff assisted OOB to recliner.     Rica Koyanagi  PTA Acute  Rehabilitation Services Pager      [phone] Office      [phone]

## 2020-03-13 LAB — RENAL FUNCTION PANEL
Albumin: 1.7 g/dL — ABNORMAL LOW (ref 3.5–5.0)
Anion gap: 8 (ref 5–15)
BUN: 87 mg/dL — ABNORMAL HIGH (ref 8–23)
CO2: 27 mmol/L (ref 22–32)
Calcium: 8.1 mg/dL — ABNORMAL LOW (ref 8.9–10.3)
Chloride: 110 mmol/L (ref 98–111)
Creatinine, Ser: 4.13 mg/dL — ABNORMAL HIGH (ref 0.61–1.24)
GFR calc Af Amer: 15 mL/min — ABNORMAL LOW (ref >60)
GFR calc non Af Amer: 13 mL/min — ABNORMAL LOW (ref >60)
Glucose, Bld: 103 mg/dL — ABNORMAL HIGH (ref 70–99)
Phosphorus: 4.9 mg/dL — ABNORMAL HIGH (ref 2.5–4.6)
Potassium: 4.2 mmol/L (ref 3.5–5.1)
Sodium: 145 mmol/L (ref 135–145)

## 2020-03-13 LAB — CBC
HCT: 33.3 % — ABNORMAL LOW (ref 39.0–52.0)
Hemoglobin: 10.9 g/dL — ABNORMAL LOW (ref 13.0–17.0)
MCH: 33 pg (ref 26.0–34.0)
MCHC: 32.7 g/dL (ref 30.0–36.0)
MCV: 100.9 fL — ABNORMAL HIGH (ref 80.0–100.0)
Platelets: 133 10*3/uL — ABNORMAL LOW (ref 150–400)
RBC: 3.3 MIL/uL — ABNORMAL LOW (ref 4.22–5.81)
RDW: 14.1 % (ref 11.5–15.5)
WBC: 7.2 10*3/uL (ref 4.0–10.5)
nRBC: 0 % (ref 0.0–0.2)

## 2020-03-13 MED ORDER — LORAZEPAM 2 MG/ML IJ SOLN: 0.5000 mg | INTRAMUSCULAR | Status: DC | PRN

## 2020-03-13 NOTE — Progress Notes (Signed)
Patient ID: Alvin Ingram, male   DOB: 07-13-1936, 84 y.o.   MRN: [id number] S: No events overnight.  Continues to decline discussion of any kind this am.  "I just want to take a nap" O:BP 119/79   Pulse 78   Temp 97.6 F (36.4 C) (Axillary)   Resp 10   Ht 6\' 1"  (1.854 m)   Wt 66.9 kg   SpO2 99%   BMI 19.46 kg/m   Intake/Output Summary (Last 24 hours) at 03/13/2020 0952 Last data filed at 03/13/2020 0655 Gross per 24 hour  Intake 737 ml  Output 550 ml  Net 187 ml   Intake/Output: I/O last 3 completed shifts: In: 847 [P.O.:240; Other:500; NG/GT:237] Out: 550 [Urine:550]  Intake/Output this shift:  No intake/output data recorded. Weight change:  Gen: cachectic AAM in NAD CVS: no rub Resp:cta Abd: benign Ext: 2+ lower extremity edema  Recent Labs  Lab 03/07/20 0548 03/08/20 0521 03/09/20 0559 03/10/20 0524 03/11/20 0552 03/12/20 0513 03/13/20 0248  NA 140 141 141  143 144 145  146* 147* 145  K 3.5 3.4* 3.7  3.8 4.2 3.9  3.9 4.7 4.2  CL 106 101 105  106 107 108  109 110 110  CO2 25 27 24  24 27 29  26 28 27   GLUCOSE 138* 257* 93  93 96 103*  105* 110* 103*  BUN 83* 84* 80*  79* 81* 81*  83* 87* 87*  CREATININE 3.81* 3.49* 3.48*  3.48* 3.76* 3.67*  3.63* 4.12* 4.13*  ALBUMIN 1.6* 1.9* 1.9*  1.9* 1.8* 1.7* 1.8* 1.7*  CALCIUM 7.7* 7.8* 8.4*  8.4* 8.1* 8.2*  8.2* 8.3* 8.1*  PHOS 5.0* 4.6 4.7*  4.6 4.9* 4.1 4.4 4.9*  AST 25 23 23 22   --   --   --   ALT 28 27 29 28   --   --   --    Liver Function Tests: Recent Labs  Lab 03/08/20 0521 03/08/20 0521 03/09/20 0559 03/09/20 0559 03/10/20 0524 03/10/20 0524 03/11/20 0552 03/12/20 0513 03/13/20 0248  AST 23  --  23  --  22  --   --   --   --   ALT 27  --  29  --  28  --   --   --   --   ALKPHOS 115  --  119  --  115  --   --   --   --   BILITOT 0.3  --  0.5  --  0.3  --   --   --   --   PROT 3.8*  --  4.1*  --  4.0*  --   --   --   --   ALBUMIN 1.9*   < > 1.9*  1.9*   < > 1.8*   < > 1.7* 1.8* 1.7*    < > = values in this interval not displayed.   No results for input(s): LIPASE, AMYLASE in the last 168 hours. No results for input(s): AMMONIA in the last 168 hours. CBC: Recent Labs  Lab 03/09/20 0559 03/09/20 0559 03/10/20 0524 03/10/20 0524 03/11/20 0552 03/12/20 1215 03/13/20 0248  WBC 6.7   < > 6.3   < > 6.0 6.8 7.2  NEUTROABS 4.7  --  4.2  --  4.2  --   --   HGB 12.9*   < > 12.6*   < > 11.4* 10.4* 10.9*  HCT 38.5*   < > 38.0*   < >  35.0* 31.4* 33.3*  MCV 97.2  --  98.4  --  99.4 100.0 100.9*  PLT 133*   < > 140*   < > 158 129* 133*   < > = values in this interval not displayed.   Cardiac Enzymes: Recent Labs  Lab 03/08/20 1402  CKTOTAL 38*   CBG: Recent Labs  Lab 03/12/20 1103  GLUCAP 104*    Iron Studies: No results for input(s): IRON, TIBC, TRANSFERRIN, FERRITIN in the last 72 hours. Studies/Results: No results found. Marland Kitchen aspirin EC  81 mg Oral Daily  . buPROPion  150 mg Oral Daily  . Chlorhexidine Gluconate Cloth  6 each Topical Daily  . feeding supplement (ENSURE ENLIVE)  237 mL Oral BID BM  . feeding supplement (NEPRO CARB STEADY)  237 mL Oral Q24H  . heparin  5,000 Units Subcutaneous Q8H  . mouth rinse  15 mL Mouth Rinse BID  . mirtazapine  15 mg Oral QHS  . multivitamin with minerals  1 tablet Oral Daily  . pantoprazole  40 mg Oral BID  . polyethylene glycol  17 g Oral BID  . senna-docusate  2 tablet Oral BID  . tamsulosin  0.4 mg Oral QPC supper    BMET    Component Value Date/Time   NA 145 03/13/2020 0248   K 4.2 03/13/2020 0248   CL 110 03/13/2020 0248   CO2 27 03/13/2020 0248   GLUCOSE 103 (H) 03/13/2020 0248   GLUCOSE 98 12/06/2006 1059   BUN 87 (H) 03/13/2020 0248   CREATININE 4.13 (H) 03/13/2020 0248   CALCIUM 8.1 (L) 03/13/2020 0248   GFRNONAA 13 (L) 03/13/2020 0248   GFRAA 15 (L) 03/13/2020 0248   CBC    Component Value Date/Time   WBC 7.2 03/13/2020 0248   RBC 3.30 (L) 03/13/2020 0248   HGB 10.9 (L) 03/13/2020 0248    HCT 33.3 (L) 03/13/2020 0248   PLT 133 (L) 03/13/2020 0248   MCV 100.9 (H) 03/13/2020 0248   MCH 33.0 03/13/2020 0248   MCHC 32.7 03/13/2020 0248   RDW 14.1 03/13/2020 0248   LYMPHSABS 1.2 03/11/2020 0552   MONOABS 0.6 03/11/2020 0552   EOSABS 0.0 03/11/2020 0552   BASOSABS 0.0 03/11/2020 0552    Assessment/Plan:  1. AKI/CKD stage 3- has been progressive since September 2020. ARB held in January after episode of volume depletion and AKI/CKD. Slight improvement of Scr today. Mild uremic symptoms with anorexia and nausea. No asterixis. He repeated has stated that he does not want any form of dialysis.  1. He may have some ATN due to nephrotic range proteinuria 2. Serologies and complements negativeas well as SPEP/ligh chains 3. UPEP positive for Bence Jones protein, lamda type  4. 24 hour urine for proteinsignificant for >3,000 mg.  5. S/p renal biopsy on 03/11/20. Appreciate IR assistance. Hopefully we can find something treatable as his kidney function has worsened over the past 6-7 months and does not want dialysis.  Should have preliminary results by 03/15/20. 6. He has repeated stated that he does not want dialysis and I concur as he is a poor candidate for outpatient dialysis given his progressive worsening nutritional and functional status as well as advanced age.  It would not improve his quality of life. 2. Proteinuria and possible nephrotic syndrome- as above. 3. FTT- EGD with gastritis and esophagitis. 4. Acute respiratory distress/agonal breathing- unclear what was cause of episode.  Currently breathing on his own and stable.  No recurrence.  5.  Depression- psych following. Medication changes made. 6. Hypoalbuminemia- possible nephrotic syndrome as well as protein and calorie malnutrition, severe. 7. HTN- stable. 8. Enlarged prostate with calcifications. Normalpsa 9. Disposition- poor prognosis given his advanced age and poor functional and nutritional status.  Palliative care consulted and goals of care discussions ongoing.He is amenable to SNF placement.  Does not want dialysis.   Donetta Potts, MD [employer] [phone]

## 2020-03-13 NOTE — Progress Notes (Signed)
Daily Progress Note   Patient Name: Alvin Ingram       Date: 03/13/2020 DOB: 06-19-1936  Age: 84 y.o. MRN#: [id number] Attending Physician: Domenic Polite, MD Primary Care Physician: Binnie Rail, MD Admit Date: 02/22/2020  Reason for Consultation/Follow-up: Establishing goals of care  Subjective:  patient is resting in bed with eyes closed, he feels nauseous. Appreciate bedside RN assistance who is in the room, discussed about prn nausea medications. See below.   Length of Stay: 18  Current Medications: Scheduled Meds:  . aspirin EC  81 mg Oral Daily  . buPROPion  150 mg Oral Daily  . Chlorhexidine Gluconate Cloth  6 each Topical Daily  . feeding supplement (ENSURE ENLIVE)  237 mL Oral BID BM  . feeding supplement (NEPRO CARB STEADY)  237 mL Oral Q24H  . heparin  5,000 Units Subcutaneous Q8H  . mouth rinse  15 mL Mouth Rinse BID  . mirtazapine  15 mg Oral QHS  . multivitamin with minerals  1 tablet Oral Daily  . pantoprazole  40 mg Oral BID  . polyethylene glycol  17 g Oral BID  . senna-docusate  2 tablet Oral BID  . tamsulosin  0.4 mg Oral QPC supper    Continuous Infusions:   PRN Meds: ondansetron (ZOFRAN) IV, promethazine  Physical Exam         Appears frail and weak Keeps eyes closed does not interact much When he does respond he replies in single word answers Has lower extremity edema Appears to have regular work of breathing Abdomen is not distended Regular  Vital Signs: BP 122/77 (BP Location: Right Arm)   Pulse 81   Temp 97.8 F (36.6 C) (Oral)   Resp 14   Ht 6\' 1"  (1.854 m)   Wt 66.9 kg   SpO2 100%   BMI 19.46 kg/m  SpO2: SpO2: 100 % O2 Device: O2 Device: Room Air O2 Flow Rate: O2 Flow Rate (L/min): 2 L/min  Intake/output summary:    Intake/Output Summary (Last 24 hours) at 03/13/2020 1214 Last data filed at 03/13/2020 0655 Gross per 24 hour  Intake 737 ml  Output 550 ml  Net 187 ml   LBM: Last BM Date: 03/12/20 Baseline Weight: Weight: 62.1 kg Most recent weight: Weight: 66.9 kg       Palliative Assessment/Data:  Flowsheet Rows     Most Recent Value  Intake Tab  Referral Department  Hospitalist  Unit at Time of Referral  Med/Surg Unit  Palliative Care Primary Diagnosis  Other (Comment) [Depression]  Date Notified  03/01/20  Palliative Care Type  New Palliative care  Reason for referral  Clarify Goals of Care  Date of Admission  02/22/20  Date first seen by Palliative Care  03/02/20  # of days Palliative referral response time  1 Day(s)  # of days IP prior to Palliative referral  8  Clinical Assessment  Palliative Performance Scale Score  50%  Psychosocial & Spiritual Assessment  Palliative Care Outcomes  Patient/Family meeting held?  Yes  Who was at the meeting?  Patient      Patient Active Problem List   Diagnosis Date Noted  . Major depressive disorder, recurrent episode, moderate ([place]) 03/07/2020  . Protein-calorie malnutrition, severe 02/24/2020  . Transaminitis 02/23/2020  . Weakness 02/23/2020  . Hypernatremia 02/23/2020  . Weight loss 02/13/2020  . Epigastric pain 02/13/2020  . Fatigue 02/13/2020  . Physical deconditioning 02/13/2020  . Failure to thrive in adult 02/13/2020  . Lower abdominal pain 01/02/2020  . Bilateral leg edema 12/29/2019  . Constipation 12/29/2019  . Syncope 12/16/2019  . AKI (acute kidney injury) ([place]) 12/16/2019  . Hyperglycemia 08/18/2019  . BPH (benign prostatic hyperplasia) 05/31/2010  . THROMBOCYTOPENIA, CHRONIC 05/24/2009  . PREMATURE ATRIAL CONTRACTIONS 05/24/2009  . GERD 05/21/2008  . Nocturia 05/21/2008  . Hyperlipidemia 04/23/2007  . History of colonic polyps 03/15/2007    Palliative Care Assessment & Plan   Patient Profile:     Assessment: Acute kidney injury, stage III chronic kidney disease with progressive renal function decline, uremic symptoms. Renal services following patient suspected to have ATN possible nephrotic syndrome as well. Failure to thrive EGD with gastritis and esophagitis patient on PPI Episode of acute respiratory distress/agonal breathing requiring monitoring in stepdown unit on 4-16 Depression Hypoalbuminemia Functional decline.   Recommendations/Plan:  Attempted to engage the patient in extensive goals of care discussions, he does not respond much.  Agree with DO NOT RESUSCITATE.  Agree with current disposition plan for SNF but if ongoing decline then residential hospice.  No additional palliative specific recommendation at this time.    Continue current mode of care. Patient is on Zofran and Phenergan PRN for nausea. Will add low dose anxiolytic, prn ativan for anxiety.    Code Status:    Code Status Orders  (From admission, onward)         Start     Ordered   03/12/20 1527  Do not attempt resuscitation (DNR)  Continuous    Question Answer Comment  In the event of cardiac or respiratory ARREST Do not call a "code blue"   In the event of cardiac or respiratory ARREST Do not perform Intubation, CPR, defibrillation or ACLS   In the event of cardiac or respiratory ARREST Use medication by any route, position, wound care, and other measures to relive pain and suffering. May use oxygen, suction and manual treatment of airway obstruction as needed for comfort.      03/12/20 1529        Code Status History    Date Active Date Inactive Code Status Order ID Comments User Context   02/22/2020 1928 03/12/2020 1529 Full Code [id number]  Orene Desanctis, DO ED   12/16/2019 2043 12/20/2019 0027 Full Code [id number]  Phillips Grout, MD ED   Advance Care Planning  Activity       Prognosis:   guarded, could have markedly limited prognosis of few weeks if ongoing uremic symptoms and decline in  renal function.   Discharge Planning:  SNF rehab with palliative versus residential hospice based on hospital course and overall disease trajectory.  Care plan was discussed with patient, bedside RN.   Thank you for allowing the Palliative Medicine Team to assist in the care of this patient.   Time In: 11 Time Out: 11.25 Total Time 25 Prolonged Time Billed No       Greater than 50%  of this time was spent counseling and coordinating care related to the above assessment and plan.  Loistine Chance, MD  Please contact Palliative Medicine Team phone at [phone] for questions and concerns.

## 2020-03-13 NOTE — Progress Notes (Signed)
PROGRESS NOTE    Alvin Ingram  BSJ:[id number] DOB: [birth year] DOA: 02/22/2020 PCP: Binnie Rail, MD  Brief Narrative Alvin Ingram a 83/M w/ BPHfollowed by urology, hyperlipidemia and GERD and other comorbidities who [facility] ED as advised by his niecewith concerns fordecreased p.o. intake, suspected uncontrolled depression, weight loss,and generalizedweakness. Evaluated by his PCP about 2 weeksprior-CT of his abdomen in February (01/02/20)showed extensive sigmoid diverticulosis and a15 mm hypodense lesion of the distal body of the pancreas that was indeterminate. MRI abdomen (02/25/20) showeda cystic lesion of the pancreas -my partners d/w patient's niece via phone and she was concerned that he might be depressed, recently lost his girlfriend, he admitted to feeling depressed without suicidal ideation. Seen by psych with recommendation to DC Elavil and start Zoloft. Appetite remained poor. Evaluated by PT OT with recommendation for SNF with 24-hour assistance/supervision. Receptive to going to SNF.   -Then developed Nausea and vomiting, , GI was consulted, EGD  4/5 showed esophagitis and hemorrhagic gastritis.    Path was notable for mild reactive gastropathy -Also seen by ENT due to concern for hoarseness in the setting of significant weight loss in a former smoker.  Laryngoscopy was unremarkable as well as CT neck -Subsequently he was noted to have worsening kidney function  -Seen by nephrology, they were concerned for nephrotic syndrome, renal biopsy performed 4/15 -4/16: pt became hypotensive and poorly responsive, CODE BLUE was called however patient did not lose his pulse or blood pressure -Subsequently regained consciousness  Assessment & Plan:   Severe Failure to thrive in adult/ Weight loss/ Depression -Presented with poor oral intake, poor appetite,weight loss, uncontrolled depression -Malignancy work-up unremarkable so far -Seen by psychiatry, they started  Zoloft and discontinued Elavil -Seen by dietitian, encouraged increase oral protein intake -Also followed by palliative care, patient refused to have any discussions with them, palliative team recommended outpatient follow-up with palliative at SNF -Patient is very depressed and is not motivated and only wants to sleep, p.o. intake is very poor -Psychiatry reconsulted and subsequently discontinued sertraline and added Wellbutrin 150 mg p.o. daily for depression and mirtazapine 50 mg p.o. nightly for depression and insomnia and appetite stimulation -Continues to have poor appetite, nausea, poor motivation to get out of bed, sit up etc -Now with worsening renal failure  AKI on CKD IV Severe hypoalbuminemia ?  Nephrotic syndrome -Presented with creatinine 3.24, proteuniuria -Baseline creatinine 2 with GFR 38 -BUN/creatinine remained relatively stable since admission however subsequently continued to worsen in the last week, creatinine up to 4.1 now -Appreciate nephrology consult, concern for nephrotic syndrome, renal biopsy performed 4/15 in radiology -Urine protein greater than 3 g, UPEP pending, serologies and complements, SPEP negative -Patient has continued to decline hemodialysis, this was discussed with him on multiple occasions, also has a niece and nephew are involved in his care, they are familiar with current situation, discussed overall poor prognosis, discussed CODE STATUS with niece, now DNR -Also if he continues to decline, especially with regards to kidney function then hospice would be most appropriate  Severe protein calorie malnutrition  Esophagitis/Hemorrhagic Gastritis -Continue PPI twice daily   chronic hoarseness  -CT neck and laryngoscopic exam was unremarkable by ENT  -ENT suspects hoarseness may be related to GERD  15 mm low attenuating lesion with fluid attenuation along the posterior distal body of the pancreas/B/L renal cysts -MRI shows cystic lesion of the  pancreas, -Recommend surveillance withrepeat imagings in 2 years.  Resolved Hypovolemic hypernatremia with iV fluid -resolved  with hydration   Chronic Diastolic CHF -Last 2D echo done on 12/17/2019 showed LVEF 65-70% With grade 1 diastolic dysfunction -Did receive Lasix in the early part of this admission which was subsequently stopped  Orthostatic Hypotension -Following diuresis, Lasix subsequently discontinued and he was given fluid bolus -Now monitor  BPH -Continue Flomax, PSA was 1.8  Ambulatory dysfunction with frequent falls -Has had 3 falls at home within the last 2 weeks per his niece -Lives alone, has not been able to appropriately take care of himself per his niece. -Continue PT OT with assistance and fall precautions. 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 -Fall Precautions -Follow up as an outpatient and continue PT/OT efforts  Normocytic Anemia  -stable  Thrombocytopenia -mild, stable  DVT prophylaxis: Heparin subcutaneous Code Status: I discussed code status w/ patient again given ongoing decline, he wanted me to d/w family, discussed code status again w/ niece and next of kin [name] 4/16, now DNR Family Communication: No family at bedside, d/w neice M.Hunter 4/14, 4/16 Disposition Plan: Dispo: The patient is from: Home              Anticipated d/c is to: SNF versus hospice              Anticipated d/c date is: To be determined              Patient currently not medically stable for discharge, has worsening renal failure and ongoing failure to thrive   Consultants:   Gastroenterology  Palliative Care Medicine  ENT  Nephrology; reconsulted  Psychiatry    Procedures:  EGD 4/5 Impression:               - Z-line regular, 42 cm from the incisors.                           - LA Grade  C esophagitis with no bleeding. Biopsied.                           - Gastritis with hemorrhage. Biopsied.                           - Extrinsic compression in the gastric antrum.                           - Normal duodenal bulb, first portion of the                            duodenum and second portion of the duodenum.   Antimicrobials:  Anti-infectives (From admission, onward)   None     Subjective: -No new complaints, remains poorly interactive, minimal oral intake  Objective: Vitals:   03/13/20 0600 03/13/20 0800 03/13/20 0817 03/13/20 0953  BP: 119/80 119/79  122/77  Pulse: 79 78  81  Resp: 13 10  14   Temp:   97.6 F (36.4 C) 97.8 F (36.6 C)  TempSrc:   Axillary Oral  SpO2: 99% 99%  100%  Weight:      Height:        Intake/Output Summary (Last 24 hours) at 03/13/2020 1236 Last data filed at  03/13/2020 0655 Gross per 24 hour  Intake 737 ml  Output 550 ml  Net 187 ml   Filed Weights   03/01/20 1204 03/02/20 0500 03/13/20 0500  Weight: 66.2 kg 66 kg 66.9 kg   Examination: Physical Exam: Gen: Elderly frail chronically ill appearing male, eyes closed, irritated upon being asked questions HEENT: PERRLA, Neck supple, no JVD Lungs: Clear anteriorly CVS: S1-S2, regular rhythm Abd: soft, Non tender, non distended, BS present Extremities: 1-2+ edema Skin: no new rashes on exposed skin   Data Reviewed: I have personally reviewed following labs and imaging studies  CBC: Recent Labs  Lab 03/07/20 0548 03/07/20 0548 03/08/20 0521 03/08/20 0521 03/09/20 0559 03/10/20 0524 03/11/20 0552 03/12/20 1215 03/13/20 0248  WBC 6.4   < > 6.4   < > 6.7 6.3 6.0 6.8 7.2  NEUTROABS 4.7  --  4.8  --  4.7 4.2 4.2  --   --   HGB 11.1*   < > 11.6*   < > 12.9* 12.6* 11.4* 10.4* 10.9*  HCT 32.7*   < > 34.5*   < > 38.5* 38.0* 35.0* 31.4* 33.3*  MCV 96.5   < > 96.9   < > 97.2 98.4 99.4 100.0 100.9*  PLT 129*   < > 134*   < > 133* 140* 158 129* 133*   < > = values in this  interval not displayed.   Basic Metabolic Panel: Recent Labs  Lab 03/07/20 0548 03/07/20 0548 03/08/20 0521 03/08/20 0521 03/09/20 0559 03/10/20 0524 03/11/20 0552 03/12/20 0513 03/13/20 0248  NA 140   < > 141   < > 141  143 144 145  146* 147* 145  K 3.5   < > 3.4*   < > 3.7  3.8 4.2 3.9  3.9 4.7 4.2  CL 106   < > 101   < > 105  106 107 108  109 110 110  CO2 25   < > 27   < > 24  24 27 29  26 28 27   GLUCOSE 138*   < > 257*   < > 93  93 96 103*  105* 110* 103*  BUN 83*   < > 84*   < > 80*  79* 81* 81*  83* 87* 87*  CREATININE 3.81*   < > 3.49*   < > 3.48*  3.48* 3.76* 3.67*  3.63* 4.12* 4.13*  CALCIUM 7.7*   < > 7.8*   < > 8.4*  8.4* 8.1* 8.2*  8.2* 8.3* 8.1*  MG 2.4  --  2.6*  --  2.6* 2.6*  --   --   --   PHOS 5.0*   < > 4.6   < > 4.7*  4.6 4.9* 4.1 4.4 4.9*   < > = values in this interval not displayed.   GFR: Estimated Creatinine Clearance: 13.3 mL/min (A) (by C-G formula based on SCr of 4.13 mg/dL (H)). Liver Function Tests: Recent Labs  Lab 03/07/20 0548 03/07/20 0548 03/08/20 0521 03/08/20 0521 03/09/20 0559 03/10/20 0524 03/11/20 0552 03/12/20 0513 03/13/20 0248  AST 25  --  23  --  23 22  --   --   --   ALT 28  --  27  --  29 28  --   --   --   ALKPHOS 112  --  115  --  119 115  --   --   --   BILITOT 0.4  --  0.3  --  0.5 0.3  --   --   --   PROT 3.6*  --  3.8*  --  4.1* 4.0*  --   --   --   ALBUMIN 1.6*   < > 1.9*   < > 1.9*  1.9* 1.8* 1.7* 1.8* 1.7*   < > = values in this interval not displayed.   No results for input(s): LIPASE, AMYLASE in the last 168 hours. No results for input(s): AMMONIA in the last 168 hours. Coagulation Profile: Recent Labs  Lab 03/11/20 0552  INR 1.0   Cardiac Enzymes: Recent Labs  Lab 03/08/20 1402  CKTOTAL 38*   BNP (last 3 results) Recent Labs    12/29/19 1639 02/13/20 1431  PROBNP 224.0* 84.0   HbA1C: No results for input(s): HGBA1C in the last 72 hours. CBG: Recent Labs  Lab  03/12/20 1103  GLUCAP 104*   Lipid Profile: No results for input(s): CHOL, HDL, LDLCALC, TRIG, CHOLHDL, LDLDIRECT in the last 72 hours. Thyroid Function Tests: No results for input(s): TSH, T4TOTAL, FREET4, T3FREE, THYROIDAB in the last 72 hours. Anemia Panel: No results for input(s): VITAMINB12, FOLATE, FERRITIN, TIBC, IRON, RETICCTPCT in the last 72 hours. Sepsis Labs: No results for input(s): PROCALCITON, LATICACIDVEN in the last 168 hours.  Recent Results (from the past 240 hour(s))  MRSA PCR Screening     Status: None   Collection Time: 03/12/20 11:35 AM   Specimen: Nasal Mucosa; Nasopharyngeal  Result Value Ref Range Status   MRSA by PCR NEGATIVE NEGATIVE Final    Comment:        The GeneXpert MRSA Assay (FDA approved for NASAL specimens only), is one component of a comprehensive MRSA colonization surveillance program. It is not intended to diagnose MRSA infection nor to guide or monitor treatment for MRSA infections. Performed at [facility], [place] [address], [place], [place] [address]      RN Pressure Injury Documentation:     Estimated body mass index is 19.46 kg/m as calculated from the following:   Height as of this encounter: 6\' 1"  (1.854 m).   Weight as of this encounter: 66.9 kg.  Malnutrition Type:  Nutrition Problem: Severe Malnutrition Etiology: acute illness(unknown etiology)   Malnutrition Characteristics:  Signs/Symptoms: moderate fat depletion, moderate muscle depletion, severe muscle depletion, percent weight loss Percent weight loss: 20 %   Nutrition Interventions:  Interventions: Ensure Enlive (each supplement provides 350kcal and 20 grams of protein), Magic cup, MVI   Radiology Studies: No results found. Scheduled Meds: . aspirin EC  81 mg Oral Daily  . buPROPion  150 mg Oral Daily  . Chlorhexidine Gluconate Cloth  6 each Topical Daily  . feeding supplement (ENSURE ENLIVE)  237 mL Oral BID BM  . feeding  supplement (NEPRO CARB STEADY)  237 mL Oral Q24H  . heparin  5,000 Units Subcutaneous Q8H  . mouth rinse  15 mL Mouth Rinse BID  . mirtazapine  15 mg Oral QHS  . multivitamin with minerals  1 tablet Oral Daily  . pantoprazole  40 mg Oral BID  . polyethylene glycol  17 g Oral BID  . senna-docusate  2 tablet Oral BID  . tamsulosin  0.4 mg Oral QPC supper   Continuous Infusions:   LOS: 18 days   Domenic Polite, MD [facility]

## 2020-03-13 NOTE — Progress Notes (Signed)
Pt's nephew was bedside and wanted to talk about pt's will/poa for his estate. I explained we administer HCPOA's and would not be able to assist for his will. He said he only needed a witness that his uncle signed it. I told him we could not help with that either. He wanted to know if he could bring a notary in and I told him I would check but he would still have a problem getting three witnesses. He is aware of the challenging logistics to get this done during his uncle's hospital stay since it is not related to his uncle's healthcare. I encouraged them to request a chaplain if we can assist in another way. Please page [phone] if assistance is needed. [place], [place]   03/13/20 1400  Clinical Encounter Type  Visited With Patient and family together

## 2020-03-14 LAB — CBC
HCT: 27.8 % — ABNORMAL LOW (ref 39.0–52.0)
Hemoglobin: 9.1 g/dL — ABNORMAL LOW (ref 13.0–17.0)
MCH: 32.7 pg (ref 26.0–34.0)
MCHC: 32.7 g/dL (ref 30.0–36.0)
MCV: 100 fL (ref 80.0–100.0)
Platelets: 127 10*3/uL — ABNORMAL LOW (ref 150–400)
RBC: 2.78 MIL/uL — ABNORMAL LOW (ref 4.22–5.81)
RDW: 13.8 % (ref 11.5–15.5)
WBC: 6.8 10*3/uL (ref 4.0–10.5)
nRBC: 0 % (ref 0.0–0.2)

## 2020-03-14 LAB — RENAL FUNCTION PANEL
Albumin: 1.7 g/dL — ABNORMAL LOW (ref 3.5–5.0)
Anion gap: 9 (ref 5–15)
BUN: 85 mg/dL — ABNORMAL HIGH (ref 8–23)
CO2: 27 mmol/L (ref 22–32)
Calcium: 8.2 mg/dL — ABNORMAL LOW (ref 8.9–10.3)
Chloride: 109 mmol/L (ref 98–111)
Creatinine, Ser: 4.45 mg/dL — ABNORMAL HIGH (ref 0.61–1.24)
GFR calc Af Amer: 13 mL/min — ABNORMAL LOW (ref >60)
GFR calc non Af Amer: 12 mL/min — ABNORMAL LOW (ref >60)
Glucose, Bld: 88 mg/dL (ref 70–99)
Phosphorus: 5.2 mg/dL — ABNORMAL HIGH (ref 2.5–4.6)
Potassium: 4.1 mmol/L (ref 3.5–5.1)
Sodium: 145 mmol/L (ref 135–145)

## 2020-03-14 NOTE — Progress Notes (Signed)
Patient ID: Alvin Ingram, male   DOB: [birth year], 84 y.o.   MRN: [id number] S: No new events overnight O:BP 124/75 (BP Location: Left Arm)   Pulse 78   Temp (!) 97.5 F (36.4 C) (Oral)   Resp 18   Ht 6\' 1"  (1.854 m)   Wt 65.3 kg   SpO2 100%   BMI 18.99 kg/m   Intake/Output Summary (Last 24 hours) at 03/14/2020 0856 Last data filed at 03/14/2020 0535 Gross per 24 hour  Intake 620 ml  Output 600 ml  Net 20 ml   Intake/Output: I/O last 3 completed shifts: In: 163 [P.O.:620; NG/GT:237] Out: 1150 [Urine:1150]  Intake/Output this shift:  No intake/output data recorded. Weight change: -1.6 kg Gen: frail, cachectic male lying in bed in NAD CVS: no rub Resp: poor inspiratory effort Abd: +BS, soft, NT/ND Ext: 2+ lower extremity pitting edema  Recent Labs  Lab 03/08/20 0521 03/09/20 0559 03/10/20 0524 03/11/20 0552 03/12/20 0513 03/13/20 0248 03/14/20 0543  NA 141 141  143 144 145  146* 147* 145 145  K 3.4* 3.7  3.8 4.2 3.9  3.9 4.7 4.2 4.1  CL 101 105  106 107 108  109 110 110 109  CO2 27 24  24 27 29  26 28 27 27   GLUCOSE 257* 93  93 96 103*  105* 110* 103* 88  BUN 84* 80*  79* 81* 81*  83* 87* 87* 85*  CREATININE 3.49* 3.48*  3.48* 3.76* 3.67*  3.63* 4.12* 4.13* 4.45*  ALBUMIN 1.9* 1.9*  1.9* 1.8* 1.7* 1.8* 1.7* 1.7*  CALCIUM 7.8* 8.4*  8.4* 8.1* 8.2*  8.2* 8.3* 8.1* 8.2*  PHOS 4.6 4.7*  4.6 4.9* 4.1 4.4 4.9* 5.2*  AST 23 23 22   --   --   --   --   ALT 27 29 28   --   --   --   --    Liver Function Tests: Recent Labs  Lab 03/08/20 0521 03/08/20 0521 03/09/20 0559 03/09/20 0559 03/10/20 0524 03/11/20 0552 03/12/20 0513 03/13/20 0248 03/14/20 0543  AST 23  --  23  --  22  --   --   --   --   ALT 27  --  29  --  28  --   --   --   --   ALKPHOS 115  --  119  --  115  --   --   --   --   BILITOT 0.3  --  0.5  --  0.3  --   --   --   --   PROT 3.8*  --  4.1*  --  4.0*  --   --   --   --   ALBUMIN 1.9*   < > 1.9*  1.9*   < > 1.8*   < > 1.8* 1.7*  1.7*   < > = values in this interval not displayed.   No results for input(s): LIPASE, AMYLASE in the last 168 hours. No results for input(s): AMMONIA in the last 168 hours. CBC: Recent Labs  Lab 03/09/20 0559 03/09/20 0559 03/10/20 0524 03/10/20 0524 03/11/20 0552 03/11/20 0552 03/12/20 1215 03/13/20 0248 03/14/20 0543  WBC 6.7   < > 6.3   < > 6.0   < > 6.8 7.2 6.8  NEUTROABS 4.7  --  4.2  --  4.2  --   --   --   --   HGB 12.9*   < >  12.6*   < > 11.4*   < > 10.4* 10.9* 9.1*  HCT 38.5*   < > 38.0*   < > 35.0*   < > 31.4* 33.3* 27.8*  MCV 97.2   < > 98.4  --  99.4  --  100.0 100.9* 100.0  PLT 133*   < > 140*   < > 158   < > 129* 133* 127*   < > = values in this interval not displayed.   Cardiac Enzymes: Recent Labs  Lab 03/08/20 1402  CKTOTAL 38*   CBG: Recent Labs  Lab 03/12/20 1103  GLUCAP 104*    Iron Studies: No results for input(s): IRON, TIBC, TRANSFERRIN, FERRITIN in the last 72 hours. Studies/Results: No results found. Marland Kitchen aspirin EC  81 mg Oral Daily  . buPROPion  150 mg Oral Daily  . Chlorhexidine Gluconate Cloth  6 each Topical Daily  . feeding supplement (ENSURE ENLIVE)  237 mL Oral BID BM  . feeding supplement (NEPRO CARB STEADY)  237 mL Oral Q24H  . heparin  5,000 Units Subcutaneous Q8H  . mouth rinse  15 mL Mouth Rinse BID  . mirtazapine  15 mg Oral QHS  . multivitamin with minerals  1 tablet Oral Daily  . pantoprazole  40 mg Oral BID  . polyethylene glycol  17 g Oral BID  . senna-docusate  2 tablet Oral BID  . tamsulosin  0.4 mg Oral QPC supper    BMET    Component Value Date/Time   NA 145 03/14/2020 0543   K 4.1 03/14/2020 0543   CL 109 03/14/2020 0543   CO2 27 03/14/2020 0543   GLUCOSE 88 03/14/2020 0543   GLUCOSE 98 12/06/2006 1059   BUN 85 (H) 03/14/2020 0543   CREATININE 4.45 (H) 03/14/2020 0543   CALCIUM 8.2 (L) 03/14/2020 0543   GFRNONAA 12 (L) 03/14/2020 0543   GFRAA 13 (L) 03/14/2020 0543   CBC    Component Value Date/Time    WBC 6.8 03/14/2020 0543   RBC 2.78 (L) 03/14/2020 0543   HGB 9.1 (L) 03/14/2020 0543   HCT 27.8 (L) 03/14/2020 0543   PLT 127 (L) 03/14/2020 0543   MCV 100.0 03/14/2020 0543   MCH 32.7 03/14/2020 0543   MCHC 32.7 03/14/2020 0543   RDW 13.8 03/14/2020 0543   LYMPHSABS 1.2 03/11/2020 0552   MONOABS 0.6 03/11/2020 0552   EOSABS 0.0 03/11/2020 0552   BASOSABS 0.0 03/11/2020 0552    Assessment/Plan:  1. AKI/CKD stage 3- has been progressive since September 2020. ARB held in January after episode of volume depletion and AKI/CKD. Slight improvement of Scr today. Mild uremic symptoms with anorexia and nausea. No asterixis. He repeated has stated that he does not want any form of dialysis.  1. He may have some ATN due to nephrotic range proteinuria 2. Serologies and complements negativeas well as SPEP/light chains 3. UPEP positive for Bence Jones protein, lamda type  4. 24 hour urine for proteinsignificant for >3,000 mg.  5. S/p renal biopsy on 03/11/20. Appreciate IR assistance. Hopefully we can find something treatable as his kidney function has worsened over the past 6-7 months and does not want dialysis.  Should have preliminary results by 03/15/20. 6. He has repeatedly stated that he does not want dialysis and I concur as he is a poor candidate for outpatient dialysis given his poor nutritional and functional status as well as advanced age. It would not improve his quality of life. 2. Proteinuria and  possible nephrotic syndrome- as above. 3. FTT- EGD with gastritis and esophagitis. 4. Acute respiratory distress/agonal breathing- unclear what was cause of episode. Currently breathing on his own and stable.  No recurrence.  5. Depression- psych following. Medication changes made. 6. Hypoalbuminemia- possible nephrotic syndrome as well as protein and calorie malnutrition, severe. 7. HTN- stable. 8. Enlarged prostate with calcifications. Normalpsa 9. Disposition- poor  prognosis given his advanced age and poor functional and nutritional status. Palliative care consulted and goals of care discussions ongoing.He is amenable to SNF placement. Does not want dialysis.  Donetta Potts, MD [employer] [phone]

## 2020-03-14 NOTE — Progress Notes (Signed)
PROGRESS NOTE    Alvin Ingram  UYQ:[id number] DOB: 1936/01/16 DOA: 02/22/2020 PCP: Binnie Rail, MD  Brief Narrative Alvin Ingram a 83/M w/ BPHfollowed by urology, hyperlipidemia and GERD and other comorbidities who presented [facility] ED as advised by his niecewith concerns fordecreased p.o. intake, suspected uncontrolled depression, weight loss,and generalizedweakness. Evaluated by his PCP about 2 weeksprior-CT of his abdomen in February (01/02/20)showed extensive sigmoid diverticulosis and a15 mm hypodense lesion of the distal body of the pancreas that was indeterminate. MRI abdomen (02/25/20) showeda cystic lesion of the pancreas -my partners d/w patient's niece via phone and she was concerned that he might be depressed, recently lost his girlfriend, he admitted to feeling depressed without suicidal ideation. Seen by psych with recommendation to DC Elavil and start Zoloft. Appetite remained poor. Evaluated by PT OT with recommendation for SNF with 24-hour assistance/supervision. Receptive to going to SNF.   -Then developed Nausea and vomiting, , GI was consulted, EGD  4/5 showed esophagitis and hemorrhagic gastritis.    Path was notable for mild reactive gastropathy -Also seen by ENT due to concern for hoarseness in the setting of significant weight loss in a former smoker.  Laryngoscopy was unremarkable as well as CT neck -Subsequently he was noted to have worsening kidney function  -Seen by nephrology, they were concerned for nephrotic syndrome, renal biopsy performed 4/15 -4/16: pt became hypotensive and poorly responsive, CODE BLUE was called however patient did not lose his pulse or blood pressure -Subsequently regained consciousness -Continues to have failure to thrive with minimal oral and oral intake, kidney function continues to decline  Assessment & Plan:   Severe Failure to thrive in adult/ Weight loss/ Depression -Presented with poor oral intake, poor  appetite,weight loss, uncontrolled depression -Malignancy work-up unremarkable so far -Seen by psychiatry, they started Zoloft and discontinued Elavil -Seen by dietitian, encouraged increase oral protein intake -Also followed by palliative care, patient refused to have any discussions with them, palliative team recommended outpatient follow-up with palliative at SNF -Patient is very depressed and is not motivated and only wants to sleep, p.o. intake is very poor -Psychiatry reconsulted and subsequently discontinued sertraline and added Wellbutrin 150 mg p.o. daily for depression and mirtazapine 50 mg p.o. nightly for depression and insomnia and appetite stimulation -Continues to have poor appetite, nausea, poor motivation to get out of bed, sit up etc -worsening failure to thrive -Now with worsening renal failure, pt declines dialysis, awaiting Renal biopsy  AKI on CKD IV Severe hypoalbuminemia ?  Nephrotic syndrome -Presented with creatinine 3.24, proteuniuria -Baseline creatinine 2 with GFR 38 -BUN/creatinine remained relatively stable since admission however subsequently continued to worsen in the last week, creatinine up to 4.1 now -Appreciate nephrology consult, concern for nephrotic syndrome, renal biopsy performed 4/15 in radiology -Urine protein greater than 3 g, UPEP pending, serologies and complements, SPEP negative -Patient has continued to decline hemodialysis, this was discussed with him on multiple occasions, also has a niece and nephew are involved in his care, they are familiar with current situation, discussed overall poor prognosis, discussed CODE STATUS with niece, now DNR -Also if he continues to decline, especially with regards to kidney function then hospice would be most appropriate, otherwise SNF w/Palliative care  Severe protein calorie malnutrition -minimal oral intake  Esophagitis/Hemorrhagic Gastritis -Continue PPI twice daily   chronic hoarseness  -CT neck  and laryngoscopic exam was unremarkable by ENT  -ENT suspects hoarseness may be related to GERD  15 mm low attenuating lesion with  fluid attenuation along the posterior distal body of the pancreas/B/L renal cysts -MRI shows cystic lesion of the pancreas, -Recommend surveillance withrepeat imagings in 2 years.  Resolved Hypovolemic hypernatremia with iV fluid -resolved with hydration   Chronic Diastolic CHF -Last 2D echo done on 12/17/2019 showed LVEF 65-70% With grade 1 diastolic dysfunction -Did receive Lasix in the early part of this admission which was subsequently stopped  Orthostatic Hypotension -Following diuresis, Lasix subsequently discontinued and he was given fluid bolus -Now monitor  BPH -Continue Flomax, PSA was 1.8  Ambulatory dysfunction with frequent falls -Has had 3 falls at home within the last 2 weeks per his niece -Lives alone, has not been able to appropriately take care of himself per his niece. -Continue PT OT with assistance and fall precautions. 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 -Fall Precautions -Follow up as an outpatient and continue PT/OT efforts  Normocytic Anemia  -stable  Thrombocytopenia -mild, stable  DVT prophylaxis: Heparin subcutaneous Code Status: I discussed code status w/ patient again given ongoing decline, he wanted me to d/w family, discussed code status again w/ niece and next of kin [name] 4/16, now DNR Family Communication: No family at bedside, d/w neice M.Hunter 4/14, 4/16 Disposition Plan: Dispo: The patient is from: Home              Anticipated d/c is to: SNF versus hospice              Anticipated d/c date is: To be determined              Patient currently not medically stable for discharge, has worsening renal failure and ongoing failure to  thrive, renal biopsy pending   Consultants:   Gastroenterology  Palliative Care Medicine  ENT  Nephrology; reconsulted  Psychiatry    Procedures:  EGD 4/5 Impression:               - Z-line regular, 42 cm from the incisors.                           - LA Grade C esophagitis with no bleeding. Biopsied.                           - Gastritis with hemorrhage. Biopsied.                           - Extrinsic compression in the gastric antrum.                           - Normal duodenal bulb, first portion of the                            duodenum and second portion of the duodenum.   Antimicrobials:  Anti-infectives (From admission, onward)   None     Subjective: -Continues to have minimal oral intake, denies any complaints, laying in bed most of the day, per staff nurse refused to sit up in his recliner multiple times yesterday  Objective: Vitals:   03/13/20 1850 03/13/20 2225 03/14/20 0532 03/14/20 1315  BP: 117/70 129/71 124/75 98/64  Pulse: 76 77 78 87  Resp: 13 18 18 16   Temp: 97.7 F (36.5 C) 97.6 F (36.4 C) (!) 97.5 F (36.4 C) 97.9 F (36.6 C)  TempSrc: Oral Oral Oral Oral  SpO2: 99% 100% 100% 100%  Weight:   65.3 kg   Height:        Intake/Output Summary (Last 24 hours) at 03/14/2020 1318 Last data filed at 03/14/2020 0910 Gross per 24 hour  Intake 740 ml  Output 600 ml  Net 140 ml   Filed Weights   03/02/20 0500 03/13/20 0500 03/14/20 0532  Weight: 66 kg 66.9 kg 65.3 kg   Examination: Physical Exam: Gen: Elderly frail chronically ill-appearing male, eyes closed, does not want to be bothered, oriented to self and place HEENT:  no JVD Lungs: Decreased breath sounds the bases CVS: RRR,No Gallops,Rubs or new Murmurs Abd: soft, Non tender, non distended, BS present Extremities: 2+ edema Skin: no new rashes on exposed skin   Data Reviewed: I have personally reviewed following labs and imaging studies  CBC: Recent Labs  Lab 03/08/20 0521  03/08/20 0521 03/09/20 0559 03/09/20 0559 03/10/20 0524 03/11/20 0552 03/12/20 1215 03/13/20 0248 03/14/20 0543  WBC 6.4   < > 6.7   < > 6.3 6.0 6.8 7.2 6.8  NEUTROABS 4.8  --  4.7  --  4.2 4.2  --   --   --   HGB 11.6*   < > 12.9*   < > 12.6* 11.4* 10.4* 10.9* 9.1*  HCT 34.5*   < > 38.5*   < > 38.0* 35.0* 31.4* 33.3* 27.8*  MCV 96.9   < > 97.2   < > 98.4 99.4 100.0 100.9* 100.0  PLT 134*   < > 133*   < > 140* 158 129* 133* 127*   < > = values in this interval not displayed.   Basic Metabolic Panel: Recent Labs  Lab 03/08/20 0521 03/08/20 0521 03/09/20 0559 03/09/20 0559 03/10/20 0524 03/11/20 0552 03/12/20 0513 03/13/20 0248 03/14/20 0543  NA 141   < > 141  143   < > 144 145  146* 147* 145 145  K 3.4*   < > 3.7  3.8   < > 4.2 3.9  3.9 4.7 4.2 4.1  CL 101   < > 105  106   < > 107 108  109 110 110 109  CO2 27   < > 24  24   < > 27 29  26 28 27 27   GLUCOSE 257*   < > 93  93   < > 96 103*  105* 110* 103* 88  BUN 84*   < > 80*  79*   < > 81* 81*  83* 87* 87* 85*  CREATININE 3.49*   < > 3.48*  3.48*   < > 3.76* 3.67*  3.63* 4.12* 4.13* 4.45*  CALCIUM 7.8*   < > 8.4*  8.4*   < > 8.1* 8.2*  8.2* 8.3* 8.1* 8.2*  MG 2.6*  --  2.6*  --  2.6*  --   --   --   --   PHOS 4.6   < > 4.7*  4.6   < > 4.9* 4.1 4.4 4.9* 5.2*   < > = values in this interval not displayed.   GFR: Estimated Creatinine Clearance: 12 mL/min (A) (by C-G formula based on SCr of 4.45 mg/dL (H)). Liver Function Tests: Recent Labs  Lab 03/08/20 0521 03/08/20 0521 03/09/20 0559 03/09/20 0559 03/10/20 0524 03/11/20  4235 03/12/20 0513 03/13/20 0248 03/14/20 0543  AST 23  --  23  --  22  --   --   --   --   ALT 27  --  29  --  28  --   --   --   --   ALKPHOS 115  --  119  --  115  --   --   --   --   BILITOT 0.3  --  0.5  --  0.3  --   --   --   --   PROT 3.8*  --  4.1*  --  4.0*  --   --   --   --   ALBUMIN 1.9*   < > 1.9*  1.9*   < > 1.8* 1.7* 1.8* 1.7* 1.7*   < > = values in this  interval not displayed.   No results for input(s): LIPASE, AMYLASE in the last 168 hours. No results for input(s): AMMONIA in the last 168 hours. Coagulation Profile: Recent Labs  Lab 03/11/20 0552  INR 1.0   Cardiac Enzymes: Recent Labs  Lab 03/08/20 1402  CKTOTAL 38*   BNP (last 3 results) Recent Labs    12/29/19 1639 02/13/20 1431  PROBNP 224.0* 84.0   HbA1C: No results for input(s): HGBA1C in the last 72 hours. CBG: Recent Labs  Lab 03/12/20 1103  GLUCAP 104*   Lipid Profile: No results for input(s): CHOL, HDL, LDLCALC, TRIG, CHOLHDL, LDLDIRECT in the last 72 hours. Thyroid Function Tests: No results for input(s): TSH, T4TOTAL, FREET4, T3FREE, THYROIDAB in the last 72 hours. Anemia Panel: No results for input(s): VITAMINB12, FOLATE, FERRITIN, TIBC, IRON, RETICCTPCT in the last 72 hours. Sepsis Labs: No results for input(s): PROCALCITON, LATICACIDVEN in the last 168 hours.  Recent Results (from the past 240 hour(s))  MRSA PCR Screening     Status: None   Collection Time: 03/12/20 11:35 AM   Specimen: Nasal Mucosa; Nasopharyngeal  Result Value Ref Range Status   MRSA by PCR NEGATIVE NEGATIVE Final    Comment:        The GeneXpert MRSA Assay (FDA approved for NASAL specimens only), is one component of a comprehensive MRSA colonization surveillance program. It is not intended to diagnose MRSA infection nor to guide or monitor treatment for MRSA infections. Performed at [facility], [place] [address] [place],  [address]      RN Pressure Injury Documentation:     Estimated body mass index is 18.99 kg/m as calculated from the following:   Height as of this encounter: 6\' 1"  (1.854 m).   Weight as of this encounter: 65.3 kg.  Malnutrition Type:  Nutrition Problem: Severe Malnutrition Etiology: acute illness(unknown etiology)   Malnutrition Characteristics:  Signs/Symptoms: moderate fat depletion, moderate muscle  depletion, severe muscle depletion, percent weight loss Percent weight loss: 20 %   Nutrition Interventions:  Interventions: Ensure Enlive (each supplement provides 350kcal and 20 grams of protein), Magic cup, MVI   Radiology Studies: No results found. Scheduled Meds: . aspirin EC  81 mg Oral Daily  . buPROPion  150 mg Oral Daily  . Chlorhexidine Gluconate Cloth  6 each Topical Daily  . feeding supplement (ENSURE ENLIVE)  237 mL Oral BID BM  . feeding supplement (NEPRO CARB STEADY)  237 mL Oral Q24H  . heparin  5,000 Units Subcutaneous Q8H  . mouth rinse  15 mL Mouth Rinse BID  . mirtazapine  15 mg Oral QHS  .  multivitamin with minerals  1 tablet Oral Daily  . pantoprazole  40 mg Oral BID  . polyethylene glycol  17 g Oral BID  . senna-docusate  2 tablet Oral BID  . tamsulosin  0.4 mg Oral QPC supper   Continuous Infusions:   LOS: 19 days   Domenic Polite, MD [facility]

## 2020-03-15 LAB — RENAL FUNCTION PANEL
Albumin: 1.7 g/dL — ABNORMAL LOW (ref 3.5–5.0)
Anion gap: 9 (ref 5–15)
BUN: 95 mg/dL — ABNORMAL HIGH (ref 8–23)
CO2: 28 mmol/L (ref 22–32)
Calcium: 8.2 mg/dL — ABNORMAL LOW (ref 8.9–10.3)
Chloride: 108 mmol/L (ref 98–111)
Creatinine, Ser: 4.6 mg/dL — ABNORMAL HIGH (ref 0.61–1.24)
GFR calc Af Amer: 13 mL/min — ABNORMAL LOW (ref >60)
GFR calc non Af Amer: 11 mL/min — ABNORMAL LOW (ref >60)
Glucose, Bld: 107 mg/dL — ABNORMAL HIGH (ref 70–99)
Phosphorus: 4.8 mg/dL — ABNORMAL HIGH (ref 2.5–4.6)
Potassium: 4.3 mmol/L (ref 3.5–5.1)
Sodium: 145 mmol/L (ref 135–145)

## 2020-03-15 NOTE — Progress Notes (Signed)
Resumed care of patient from Ify at 1500. Pt resting in bed and requesting to be left alone to rest at this time. Agree with her previous assessment. Will continue to monitor patient.

## 2020-03-15 NOTE — Progress Notes (Signed)
Daily Progress Note   Patient Name: Alvin Ingram       Date: 03/15/2020 DOB: 02/23/1936  Age: 84 y.o. MRN#: [id number] Attending Physician: Domenic Polite, MD Primary Care Physician: Binnie Rail, MD Admit Date: 02/22/2020  Reason for Consultation/Follow-up: Establishing goals of care  Subjective:  patient is resting in bed with eyes closed, doesn't appear to be in distress.  Chart has been reviewed.  Discussed with TRH MD, transitions of care colleague.  Additionally call placed and discussed with niece [name], see below   Length of Stay: 20  Current Medications: Scheduled Meds:  . aspirin EC  81 mg Oral Daily  . buPROPion  150 mg Oral Daily  . Chlorhexidine Gluconate Cloth  6 each Topical Daily  . feeding supplement (ENSURE ENLIVE)  237 mL Oral BID BM  . feeding supplement (NEPRO CARB STEADY)  237 mL Oral Q24H  . heparin  5,000 Units Subcutaneous Q8H  . mouth rinse  15 mL Mouth Rinse BID  . mirtazapine  15 mg Oral QHS  . multivitamin with minerals  1 tablet Oral Daily  . pantoprazole  40 mg Oral BID  . polyethylene glycol  17 g Oral BID  . senna-docusate  2 tablet Oral BID  . tamsulosin  0.4 mg Oral QPC supper    Continuous Infusions:   PRN Meds: LORazepam, ondansetron (ZOFRAN) IV, promethazine  Physical Exam         Appears frail and weak Keeps eyes closed does not interact much When he does respond he replies in single word answers Has lower extremity edema Appears to have regular work of breathing Abdomen is not distended Regular  Vital Signs: BP 113/63 (BP Location: Left Arm)   Pulse 82   Temp 97.7 F (36.5 C) (Axillary)   Resp 18   Ht 6\' 1"  (1.854 m)   Wt 65.4 kg   SpO2 100%   BMI 19.02 kg/m  SpO2: SpO2: 100 % O2 Device: O2 Device: Room Air O2  Flow Rate: O2 Flow Rate (L/min): 2 L/min  Intake/output summary:   Intake/Output Summary (Last 24 hours) at 03/15/2020 1416 Last data filed at 03/15/2020 1003 Gross per 24 hour  Intake 110 ml  Output 650 ml  Net -540 ml   LBM: Last BM Date: 03/14/20 Baseline Weight: Weight: 62.1 kg  Most recent weight: Weight: 65.4 kg       Palliative Assessment/Data:    Flowsheet Rows     Most Recent Value  Intake Tab  Referral Department  Hospitalist  Unit at Time of Referral  Med/Surg Unit  Palliative Care Primary Diagnosis  Other (Comment) [Depression]  Date Notified  03/01/20  Palliative Care Type  New Palliative care  Reason for referral  Clarify Goals of Care  Date of Admission  02/22/20  Date first seen by Palliative Care  03/02/20  # of days Palliative referral response time  1 Day(s)  # of days IP prior to Palliative referral  8  Clinical Assessment  Palliative Performance Scale Score  50%  Psychosocial & Spiritual Assessment  Palliative Care Outcomes  Patient/Family meeting held?  Yes  Who was at the meeting?  Patient      Patient Active Problem List   Diagnosis Date Noted  . Major depressive disorder, recurrent episode, moderate ([place]) 03/07/2020  . Protein-calorie malnutrition, severe 02/24/2020  . Transaminitis 02/23/2020  . Weakness 02/23/2020  . Hypernatremia 02/23/2020  . Weight loss 02/13/2020  . Epigastric pain 02/13/2020  . Fatigue 02/13/2020  . Physical deconditioning 02/13/2020  . Failure to thrive in adult 02/13/2020  . Lower abdominal pain 01/02/2020  . Bilateral leg edema 12/29/2019  . Constipation 12/29/2019  . Syncope 12/16/2019  . AKI (acute kidney injury) ([place]) 12/16/2019  . Hyperglycemia 08/18/2019  . BPH (benign prostatic hyperplasia) 05/31/2010  . THROMBOCYTOPENIA, CHRONIC 05/24/2009  . PREMATURE ATRIAL CONTRACTIONS 05/24/2009  . GERD 05/21/2008  . Nocturia 05/21/2008  . Hyperlipidemia 04/23/2007  . History of colonic polyps 03/15/2007     Palliative Care Assessment & Plan   Patient Profile:    Assessment: Acute kidney injury, stage III chronic kidney disease with progressive renal function decline, uremic symptoms. Renal services following patient suspected to have ATN possible nephrotic syndrome as well. Failure to thrive EGD with gastritis and esophagitis patient on PPI Episode of acute respiratory distress/agonal breathing requiring monitoring in stepdown unit on 4-16 Depression Hypoalbuminemia Functional decline.   Recommendations/Plan:  Renal biopsy results still pending.  Agree with DNR  SNF rehab with palliative versus residential hospice on discharge.  Appears that residential hospice might be more appropriate.   Call placed and discussed with niece [name].  We discussed about patient's hospital course.  Goals wishes and values attempted to be explored.  Brief life review performed.  She describes the patient is a private and hard-working person.  He never married he does not have children.  We discussed about patient's current condition being 1 of ongoing decline.  Differences between SNF versus residential hospice discussed with her in detail.  She is leaning towards residential hospice if deemed appropriate.  I agree with this.  PMT to follow.   Code Status:    Code Status Orders  (From admission, onward)         Start     Ordered   03/12/20 1527  Do not attempt resuscitation (DNR)  Continuous    Question Answer Comment  In the event of cardiac or respiratory ARREST Do not call a "code blue"   In the event of cardiac or respiratory ARREST Do not perform Intubation, CPR, defibrillation or ACLS   In the event of cardiac or respiratory ARREST Use medication by any route, position, wound care, and other measures to relive pain and suffering. May use oxygen, suction and manual treatment of airway obstruction as needed for comfort.  03/12/20 1529        Code Status History    Date Active  Date Inactive Code Status Order ID Comments User Context   02/22/2020 1928 03/12/2020 1529 Full Code [id number]  Orene Desanctis, DO ED   12/16/2019 2043 12/20/2019 0027 Full Code [id number]  Phillips Grout, MD ED   Advance Care Planning Activity       Prognosis:   guarded, could have markedly limited prognosis of few weeks if ongoing uremic symptoms and decline in renal function.   Discharge Planning:  SNF rehab with palliative versus residential hospice based on hospital course and overall disease trajectory.  Care plan was discussed with patient, bedside RN.   Thank you for allowing the Palliative Medicine Team to assist in the care of this patient.   Time In: 1300 Time Out: 1325 Total Time 25 Prolonged Time Billed No       Greater than 50%  of this time was spent counseling and coordinating care related to the above assessment and plan.  Loistine Chance, MD  Please contact Palliative Medicine Team phone at [phone] for questions and concerns.

## 2020-03-15 NOTE — Progress Notes (Signed)
Patient ID: Alvin Ingram, male   DOB: [birth year], 84 y.o.   MRN: [id number] S: No new events overnight O:BP 113/63 (BP Location: Left Arm)   Pulse 82   Temp 97.7 F (36.5 C) (Axillary)   Resp 18   Ht 6\' 1"  (1.854 m)   Wt 65.4 kg   SpO2 100%   BMI 19.02 kg/m   Intake/Output Summary (Last 24 hours) at 03/15/2020 1619 Last data filed at 03/15/2020 1420 Gross per 24 hour  Intake 110 ml  Output 1050 ml  Net -940 ml   Intake/Output: I/O last 3 completed shifts: In: 410 [P.O.:410] Out: 1050 [Urine:1050]  Intake/Output this shift:  Total I/O In: 60 [P.O.:60] Out: 400 [Urine:400] Weight change: 0.1 kg Gen: frail, cachectic male lying in bed in NAD CVS: no rub Resp: poor inspiratory effort Abd: +BS, soft, NT/ND Ext: 2+ lower extremity pitting edema  Recent Labs  Lab 03/09/20 0559 03/10/20 0524 03/11/20 0552 03/12/20 0513 03/13/20 0248 03/14/20 0543 03/15/20 0517  NA 141  143 144 145  146* 147* 145 145 145  K 3.7  3.8 4.2 3.9  3.9 4.7 4.2 4.1 4.3  CL 105  106 107 108  109 110 110 109 108  CO2 24  24 27 29  26 28 27 27 28   GLUCOSE 93  93 96 103*  105* 110* 103* 88 107*  BUN 80*  79* 81* 81*  83* 87* 87* 85* 95*  CREATININE 3.48*  3.48* 3.76* 3.67*  3.63* 4.12* 4.13* 4.45* 4.60*  ALBUMIN 1.9*  1.9* 1.8* 1.7* 1.8* 1.7* 1.7* 1.7*  CALCIUM 8.4*  8.4* 8.1* 8.2*  8.2* 8.3* 8.1* 8.2* 8.2*  PHOS 4.7*  4.6 4.9* 4.1 4.4 4.9* 5.2* 4.8*  AST 23 22  --   --   --   --   --   ALT 29 28  --   --   --   --   --    Liver Function Tests: Recent Labs  Lab 03/09/20 0559 03/09/20 0559 03/10/20 0524 03/11/20 0552 03/13/20 0248 03/14/20 0543 03/15/20 0517  AST 23  --  22  --   --   --   --   ALT 29  --  28  --   --   --   --   ALKPHOS 119  --  115  --   --   --   --   BILITOT 0.5  --  0.3  --   --   --   --   PROT 4.1*  --  4.0*  --   --   --   --   ALBUMIN 1.9*  1.9*   < > 1.8*   < > 1.7* 1.7* 1.7*   < > = values in this interval not displayed.   No results for  input(s): LIPASE, AMYLASE in the last 168 hours. No results for input(s): AMMONIA in the last 168 hours. CBC: Recent Labs  Lab 03/09/20 0559 03/09/20 0559 03/10/20 0524 03/10/20 0524 03/11/20 0552 03/11/20 0552 03/12/20 1215 03/13/20 0248 03/14/20 0543  WBC 6.7   < > 6.3   < > 6.0   < > 6.8 7.2 6.8  NEUTROABS 4.7  --  4.2  --  4.2  --   --   --   --   HGB 12.9*   < > 12.6*   < > 11.4*   < > 10.4* 10.9* 9.1*  HCT 38.5*   < > 38.0*   < >  35.0*   < > 31.4* 33.3* 27.8*  MCV 97.2   < > 98.4  --  99.4  --  100.0 100.9* 100.0  PLT 133*   < > 140*   < > 158   < > 129* 133* 127*   < > = values in this interval not displayed.   Cardiac Enzymes: No results for input(s): CKTOTAL, CKMB, CKMBINDEX, TROPONINI in the last 168 hours. CBG: Recent Labs  Lab 03/12/20 1103  GLUCAP 104*    Iron Studies: No results for input(s): IRON, TIBC, TRANSFERRIN, FERRITIN in the last 72 hours. Studies/Results: No results found. Marland Kitchen aspirin EC  81 mg Oral Daily  . buPROPion  150 mg Oral Daily  . Chlorhexidine Gluconate Cloth  6 each Topical Daily  . feeding supplement (ENSURE ENLIVE)  237 mL Oral BID BM  . feeding supplement (NEPRO CARB STEADY)  237 mL Oral Q24H  . heparin  5,000 Units Subcutaneous Q8H  . mouth rinse  15 mL Mouth Rinse BID  . mirtazapine  15 mg Oral QHS  . multivitamin with minerals  1 tablet Oral Daily  . pantoprazole  40 mg Oral BID  . polyethylene glycol  17 g Oral BID  . senna-docusate  2 tablet Oral BID  . tamsulosin  0.4 mg Oral QPC supper    BMET    Component Value Date/Time   NA 145 03/15/2020 0517   K 4.3 03/15/2020 0517   CL 108 03/15/2020 0517   CO2 28 03/15/2020 0517   GLUCOSE 107 (H) 03/15/2020 0517   GLUCOSE 98 12/06/2006 1059   BUN 95 (H) 03/15/2020 0517   CREATININE 4.60 (H) 03/15/2020 0517   CALCIUM 8.2 (L) 03/15/2020 0517   GFRNONAA 11 (L) 03/15/2020 0517   GFRAA 13 (L) 03/15/2020 0517   CBC    Component Value Date/Time   WBC 6.8 03/14/2020 0543    RBC 2.78 (L) 03/14/2020 0543   HGB 9.1 (L) 03/14/2020 0543   HCT 27.8 (L) 03/14/2020 0543   PLT 127 (L) 03/14/2020 0543   MCV 100.0 03/14/2020 0543   MCH 32.7 03/14/2020 0543   MCHC 32.7 03/14/2020 0543   RDW 13.8 03/14/2020 0543   LYMPHSABS 1.2 03/11/2020 0552   MONOABS 0.6 03/11/2020 0552   EOSABS 0.0 03/11/2020 0552   BASOSABS 0.0 03/11/2020 0552    Assessment/Plan:  1. AKI/CKD stage 3- has been progressive since September 2020. ARB held in January after episode of volume depletion and AKI/CKD. Slight improvement of Scr today. Mild uremic symptoms with anorexia and nausea. No asterixis. He repeated has stated that he does not want any form of dialysis.  1. He may have some ATN due to nephrotic range proteinuria 2. 24 hour urine for proteinsignificant for >3,000 mg.  3. S/p renal biopsy on 03/11/20, awaiting results 4. Poor prognosis overall. Family leaning towards residential hospice.  2. FTT- EGD with gastritis and esophagitis. 3. Acute respiratory distress/agonal breathing- unclear what was cause of episode. Currently breathing on his own and stable.  No recurrence.  4. Depression- psych following. Medication changes made. 5. Hypoalbuminemia- possible nephrotic syndrome as well as protein and calorie malnutrition, severe. 6. HTN- stable. 7. Enlarged prostate with calcifications. Brunetta Jeans, MD 03/15/2020, 4:21 PM

## 2020-03-15 NOTE — Progress Notes (Signed)
PROGRESS NOTE    Alvin Ingram  ION:[id number] DOB: 03-26-1936 DOA: 02/22/2020 PCP: Binnie Rail, MD  Brief Narrative Alvin Ingram a 83/M w/ BPHfollowed by urology, hyperlipidemia and GERD and other comorbidities who presented to [facility] as advised by his niecewith concerns fordecreased p.o. intake, suspected uncontrolled depression, weight loss,and generalizedweakness. Evaluated by his PCP about 2 weeksprior-CT of his abdomen in February (01/02/20)showed extensive sigmoid diverticulosis and a15 mm hypodense lesion of the distal body of the pancreas that was indeterminate. MRI abdomen (02/25/20) showeda cystic lesion of the pancreas -my partners d/w patient's niece via phone and she was concerned that he might be depressed, recently lost his girlfriend, he admitted to feeling depressed without suicidal ideation. Seen by psych with recommendation to DC Elavil and start Zoloft. Appetite remained poor. Evaluated by PT OT with recommendation for SNF with 24-hour assistance/supervision. Receptive to going to SNF.   -Then developed Nausea and vomiting, , GI was consulted, EGD  4/5 showed esophagitis and hemorrhagic gastritis.    Path was notable for mild reactive gastropathy -Also seen by ENT due to concern for hoarseness in the setting of significant weight loss in a former smoker.  Laryngoscopy was unremarkable as well as CT neck -Subsequently he was noted to have worsening kidney function  -Seen by nephrology, they were concerned for nephrotic syndrome, renal biopsy performed 4/15 -4/16: pt became hypotensive and had a syncopal event  Assessment & Plan:   Severe Failure to thrive in adult/ Weight loss/ Depression -Presented with poor oral intake, poor appetite,weight loss, uncontrolled depression -Malignancy work-up unremarkable so far -Seen by psychiatry, they started Zoloft and discontinued Elavil -Seen by dietitian, encouraged increase oral protein intake -Also followed by  palliative care, patient refused to have any discussions with them, palliative team recommended outpatient follow-up with palliative at SNF -Patient is very depressed and is not motivated and only wants to sleep, p.o. intake is very poor -Psychiatry reconsulted and subsequently discontinued sertraline and added Wellbutrin 150 mg p.o. daily for depression and mirtazapine 50 mg p.o. nightly for depression and insomnia and appetite stimulation -Continues to have poor appetite, nausea, poor motivation to get out of bed, sit up etc -worsening failure to thrive and renal failure -At this time we are awaiting kidney biopsy, to rule out inflammatory glomerulopathies, regardless his decline continues, discussed role of hospice with patient's niece [name] again today 4/19  AKI on CKD IV Severe hypoalbuminemia ?  Nephrotic syndrome -Presented with creatinine 3.24, proteuniuria -Baseline creatinine 2 with GFR 38 -BUN/creatinine remained relatively stable since admission however subsequently continued to worsen in the last week, creatinine up to 4.1 now -Appreciate nephrology consult, concern for nephrotic syndrome, renal biopsy performed 4/15 in radiology -Urine protein greater than 3 g, UPEP pending, serologies and complements, SPEP negative -Patient has continued to decline hemodialysis, this was discussed with him on multiple occasions, also has a niece and nephew are involved in his care, they are familiar with current situation, discussed overall poor prognosis, discussed CODE STATUS with niece, now DNR -Continue if he continues to decline, hospice is most appropriate -If improves and renal biopsy indicates a treatable cause of nephrotic syndrome then plan would be for SNF with palliative care -Patient has declined hemodialysis on numerous discussions and would not be appropriate as well  Severe protein calorie malnutrition -minimal oral intake  Esophagitis/Hemorrhagic Gastritis -Continue PPI  twice daily   chronic hoarseness  -CT neck and laryngoscopic exam was unremarkable by ENT  -ENT suspects hoarseness  may be related to GERD  15 mm low attenuating lesion with fluid attenuation along the posterior distal body of the pancreas/B/L renal cysts -MRI shows cystic lesion of the pancreas, -Recommend surveillance withrepeat imagings in 2 years.  Resolved Hypovolemic hypernatremia with iV fluid -resolved with hydration   Chronic Diastolic CHF -Last 2D echo done on 12/17/2019 showed LVEF 65-70% With grade 1 diastolic dysfunction -Did receive Lasix in the early part of this admission which was subsequently stopped  Orthostatic Hypotension -Following diuresis, Lasix subsequently discontinued and he was given fluid bolus -Now monitor  BPH -Continue Flomax, PSA was 1.8  Ambulatory dysfunction with frequent falls -Has had 3 falls at home within the last 2 weeks per his niece -Lives alone, has not been able to appropriately take care of himself per his niece. -Continue PT OT with assistance and fall precautions. 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 -Fall Precautions -Follow up as an outpatient and continue PT/OT efforts  Normocytic Anemia  -stable  Thrombocytopenia -mild, stable  DVT prophylaxis: Heparin subcutaneous Code Status: I discussed code status w/ patient again given ongoing decline, he wanted me to d/w family, discussed code status again w/ niece and next of kin [name] 4/16, now DNR Family Communication: No family at bedside, d/w neice M.Hunter 4/14, 4/16, 4/19 Disposition Plan: Dispo: The patient is from: Home              Anticipated d/c is to: SNF versus hospice              Anticipated d/c date is: To be determined              Patient currently not medically stable for discharge, has  worsening renal failure and ongoing failure to thrive, renal biopsy pending   Consultants:   Gastroenterology  Palliative Care Medicine  ENT  Nephrology; reconsulted  Psychiatry    Procedures:  EGD 4/5 Impression:               - Z-line regular, 42 cm from the incisors.                           - LA Grade C esophagitis with no bleeding. Biopsied.                           - Gastritis with hemorrhage. Biopsied.                           - Extrinsic compression in the gastric antrum.                           - Normal duodenal bulb, first portion of the                            duodenum and second portion of the duodenum.   Antimicrobials:  Anti-infectives (From admission, onward)   None     Subjective:  -Oral intake remains minimal to none, refuses to get out of bed, refuses to sit up in bed, wants to lay down  Objective: Vitals:   03/14/20 1315 03/14/20 2031 03/15/20 0526 03/15/20 1310  BP: 98/64 116/66 110/68  113/63  Pulse: 87 78 80 82  Resp: 16 18 18 18   Temp: 97.9 F (36.6 C) 99 F (37.2 C) 97.7 F (36.5 C) 97.7 F (36.5 C)  TempSrc: Oral Oral Oral Axillary  SpO2: 100% 100% 99% 100%  Weight:   65.4 kg   Height:        Intake/Output Summary (Last 24 hours) at 03/15/2020 1546 Last data filed at 03/15/2020 1420 Gross per 24 hour  Intake 110 ml  Output 1050 ml  Net -940 ml   Filed Weights   03/13/20 0500 03/14/20 0532 03/15/20 0526  Weight: 66.9 kg 65.3 kg 65.4 kg   Examination: Physical Exam: Gen: Early frail chronically ill male, eyes closed, oriented to self and place, wants to be left alone HEENT: no JVD Lungs: Decreased breath sounds bases, otherwise clear CVS: S1-S2, regular rate rhythm Abd: soft, Non tender, non distended, BS present Extremities: 2+ edema Skin: no new rashes on exposed skin   Data Reviewed: I have personally reviewed following labs and imaging studies  CBC: Recent Labs  Lab 03/09/20 0559 03/09/20 0559  03/10/20 0524 03/11/20 0552 03/12/20 1215 03/13/20 0248 03/14/20 0543  WBC 6.7   < > 6.3 6.0 6.8 7.2 6.8  NEUTROABS 4.7  --  4.2 4.2  --   --   --   HGB 12.9*   < > 12.6* 11.4* 10.4* 10.9* 9.1*  HCT 38.5*   < > 38.0* 35.0* 31.4* 33.3* 27.8*  MCV 97.2   < > 98.4 99.4 100.0 100.9* 100.0  PLT 133*   < > 140* 158 129* 133* 127*   < > = values in this interval not displayed.   Basic Metabolic Panel: Recent Labs  Lab 03/09/20 0559 03/09/20 0559 03/10/20 0524 03/10/20 0524 03/11/20 0552 03/12/20 0513 03/13/20 0248 03/14/20 0543 03/15/20 0517  NA 141  143   < > 144   < > 145  146* 147* 145 145 145  K 3.7  3.8   < > 4.2   < > 3.9  3.9 4.7 4.2 4.1 4.3  CL 105  106   < > 107   < > 108  109 110 110 109 108  CO2 24  24   < > 27   < > 29  26 28 27 27 28   GLUCOSE 93  93   < > 96   < > 103*  105* 110* 103* 88 107*  BUN 80*  79*   < > 81*   < > 81*  83* 87* 87* 85* 95*  CREATININE 3.48*  3.48*   < > 3.76*   < > 3.67*  3.63* 4.12* 4.13* 4.45* 4.60*  CALCIUM 8.4*  8.4*   < > 8.1*   < > 8.2*  8.2* 8.3* 8.1* 8.2* 8.2*  MG 2.6*  --  2.6*  --   --   --   --   --   --   PHOS 4.7*  4.6   < > 4.9*   < > 4.1 4.4 4.9* 5.2* 4.8*   < > = values in this interval not displayed.   GFR: Estimated Creatinine Clearance: 11.7 mL/min (A) (by C-G formula based on SCr of 4.6 mg/dL (H)). Liver Function Tests: Recent Labs  Lab 03/09/20 0559 03/09/20 0559 03/10/20 6629 03/10/20 0524 03/11/20 0552 03/12/20 0513 03/13/20 0248 03/14/20 0543 03/15/20 0517  AST 23  --  22  --   --   --   --   --   --  ALT 29  --  28  --   --   --   --   --   --   ALKPHOS 119  --  115  --   --   --   --   --   --   BILITOT 0.5  --  0.3  --   --   --   --   --   --   PROT 4.1*  --  4.0*  --   --   --   --   --   --   ALBUMIN 1.9*  1.9*   < > 1.8*   < > 1.7* 1.8* 1.7* 1.7* 1.7*   < > = values in this interval not displayed.   No results for input(s): LIPASE, AMYLASE in the last 168 hours. No results for  input(s): AMMONIA in the last 168 hours. Coagulation Profile: Recent Labs  Lab 03/11/20 0552  INR 1.0   Cardiac Enzymes: No results for input(s): CKTOTAL, CKMB, CKMBINDEX, TROPONINI in the last 168 hours. BNP (last 3 results) Recent Labs    12/29/19 1639 02/13/20 1431  PROBNP 224.0* 84.0   HbA1C: No results for input(s): HGBA1C in the last 72 hours. CBG: Recent Labs  Lab 03/12/20 1103  GLUCAP 104*   Lipid Profile: No results for input(s): CHOL, HDL, LDLCALC, TRIG, CHOLHDL, LDLDIRECT in the last 72 hours. Thyroid Function Tests: No results for input(s): TSH, T4TOTAL, FREET4, T3FREE, THYROIDAB in the last 72 hours. Anemia Panel: No results for input(s): VITAMINB12, FOLATE, FERRITIN, TIBC, IRON, RETICCTPCT in the last 72 hours. Sepsis Labs: No results for input(s): PROCALCITON, LATICACIDVEN in the last 168 hours.  Recent Results (from the past 240 hour(s))  MRSA PCR Screening     Status: None   Collection Time: 03/12/20 11:35 AM   Specimen: Nasal Mucosa; Nasopharyngeal  Result Value Ref Range Status   MRSA by PCR NEGATIVE NEGATIVE Final    Comment:        The GeneXpert MRSA Assay (FDA approved for NASAL specimens only), is one component of a comprehensive MRSA colonization surveillance program. It is not intended to diagnose MRSA infection nor to guide or monitor treatment for MRSA infections. Performed at [facility], [place] [address], [place],  [address]      RN Pressure Injury Documentation:     Estimated body mass index is 19.02 kg/m as calculated from the following:   Height as of this encounter: 6\' 1"  (1.854 m).   Weight as of this encounter: 65.4 kg.  Malnutrition Type:  Nutrition Problem: Severe Malnutrition Etiology: acute illness(unknown etiology)   Malnutrition Characteristics:  Signs/Symptoms: moderate fat depletion, moderate muscle depletion, severe muscle depletion, percent weight loss Percent weight loss: 20  %   Nutrition Interventions:  Interventions: Ensure Enlive (each supplement provides 350kcal and 20 grams of protein), Magic cup, MVI   Radiology Studies: No results found. Scheduled Meds: . aspirin EC  81 mg Oral Daily  . buPROPion  150 mg Oral Daily  . Chlorhexidine Gluconate Cloth  6 each Topical Daily  . feeding supplement (ENSURE ENLIVE)  237 mL Oral BID BM  . feeding supplement (NEPRO CARB STEADY)  237 mL Oral Q24H  . heparin  5,000 Units Subcutaneous Q8H  . mouth rinse  15 mL Mouth Rinse BID  . mirtazapine  15 mg Oral QHS  . multivitamin with minerals  1 tablet Oral Daily  . pantoprazole  40 mg Oral BID  . polyethylene  glycol  17 g Oral BID  . senna-docusate  2 tablet Oral BID  . tamsulosin  0.4 mg Oral QPC supper   Continuous Infusions:   LOS: 20 days   Domenic Polite, MD [facility]

## 2020-03-16 LAB — RENAL FUNCTION PANEL
Albumin: 1.5 g/dL — ABNORMAL LOW (ref 3.5–5.0)
Anion gap: 9 (ref 5–15)
BUN: 87 mg/dL — ABNORMAL HIGH (ref 8–23)
CO2: 26 mmol/L (ref 22–32)
Calcium: 8 mg/dL — ABNORMAL LOW (ref 8.9–10.3)
Chloride: 109 mmol/L (ref 98–111)
Creatinine, Ser: 4.66 mg/dL — ABNORMAL HIGH (ref 0.61–1.24)
GFR calc Af Amer: 13 mL/min — ABNORMAL LOW (ref >60)
GFR calc non Af Amer: 11 mL/min — ABNORMAL LOW (ref >60)
Glucose, Bld: 93 mg/dL (ref 70–99)
Phosphorus: 4.5 mg/dL (ref 2.5–4.6)
Potassium: 4.1 mmol/L (ref 3.5–5.1)
Sodium: 144 mmol/L (ref 135–145)

## 2020-03-16 MED ORDER — ACETAMINOPHEN 325 MG PO TABS
650.0000 mg | ORAL_TABLET | Freq: Four times a day (QID) | ORAL | Status: DC | PRN
  Administered 2020-03-16: 05:00:00 650 mg via ORAL
  Filled 2020-03-16 (×2): qty 2

## 2020-03-16 NOTE — Progress Notes (Signed)
PT Cancellation Note  Patient Details Name: Alvin Ingram MRN: [id number] DOB: [birth year]   Cancelled Treatment:     Pt in bed with covers pulled over his head.  Lunch tray untouched.  Pt declined any OOB suggesstion/assistance.  "Just get out of my room".   Will ask LPT to review his case and see next visit.  Pt has multiple refusals.    Rica Koyanagi  PTA Acute  Rehabilitation Services Pager      [phone] Office      [phone]

## 2020-03-16 NOTE — Progress Notes (Addendum)
Patient ID: Alvin Ingram, male   DOB: 01/15/1936, 84 y.o.   MRN: [id number] S: No new events overnight. Creat 4.6, slihglty up from 4.0 yest. UOP yest was 1000 cc.   O:BP (!) 105/56 (BP Location: Left Arm)   Pulse 83   Temp 97.9 F (36.6 C) (Oral)   Resp 16   Ht 6\' 1"  (1.854 m)   Wt 64.9 kg   SpO2 99%   BMI 18.87 kg/m   Intake/Output Summary (Last 24 hours) at 03/16/2020 1534 Last data filed at 03/16/2020 1242 Gross per 24 hour  Intake 260 ml  Output 550 ml  Net -290 ml   Intake/Output: I/O last 3 completed shifts: In: 250 [P.O.:250] Out: 1075 [Urine:1075]  Intake/Output this shift:  Total I/O In: 120 [P.O.:120] Out: 150 [Urine:150] Weight change: -0.536 kg Gen: frail, cachectic male lying in bed in NAD CVS: no rub Resp: poor inspiratory effort Abd: +BS, soft, NT/ND Ext: 2+ lower extremity pitting edema  Recent Labs  Lab 03/10/20 0524 03/11/20 0552 03/12/20 0513 03/13/20 0248 03/14/20 0543 03/15/20 0517 03/16/20 0518  NA 144 145  146* 147* 145 145 145 144  K 4.2 3.9  3.9 4.7 4.2 4.1 4.3 4.1  CL 107 108  109 110 110 109 108 109  CO2 27 29  26 28 27 27 28 26   GLUCOSE 96 103*  105* 110* 103* 88 107* 93  BUN 81* 81*  83* 87* 87* 85* 95* 87*  CREATININE 3.76* 3.67*  3.63* 4.12* 4.13* 4.45* 4.60* 4.66*  ALBUMIN 1.8* 1.7* 1.8* 1.7* 1.7* 1.7* 1.5*  CALCIUM 8.1* 8.2*  8.2* 8.3* 8.1* 8.2* 8.2* 8.0*  PHOS 4.9* 4.1 4.4 4.9* 5.2* 4.8* 4.5  AST 22  --   --   --   --   --   --   ALT 28  --   --   --   --   --   --    Liver Function Tests: Recent Labs  Lab 03/10/20 0524 03/11/20 0552 03/14/20 0543 03/15/20 0517 03/16/20 0518  AST 22  --   --   --   --   ALT 28  --   --   --   --   ALKPHOS 115  --   --   --   --   BILITOT 0.3  --   --   --   --   PROT 4.0*  --   --   --   --   ALBUMIN 1.8*   < > 1.7* 1.7* 1.5*   < > = values in this interval not displayed.   No results for input(s): LIPASE, AMYLASE in the last 168 hours. No results for input(s): AMMONIA in  the last 168 hours. CBC: Recent Labs  Lab 03/10/20 0524 03/10/20 0524 03/11/20 0552 03/11/20 0552 03/12/20 1215 03/13/20 0248 03/14/20 0543  WBC 6.3   < > 6.0   < > 6.8 7.2 6.8  NEUTROABS 4.2  --  4.2  --   --   --   --   HGB 12.6*   < > 11.4*   < > 10.4* 10.9* 9.1*  HCT 38.0*   < > 35.0*   < > 31.4* 33.3* 27.8*  MCV 98.4  --  99.4  --  100.0 100.9* 100.0  PLT 140*   < > 158   < > 129* 133* 127*   < > = values in this interval not displayed.   Cardiac Enzymes: No  results for input(s): CKTOTAL, CKMB, CKMBINDEX, TROPONINI in the last 168 hours. CBG: Recent Labs  Lab 03/12/20 1103  GLUCAP 104*    Iron Studies: No results for input(s): IRON, TIBC, TRANSFERRIN, FERRITIN in the last 72 hours. Studies/Results: No results found. Marland Kitchen aspirin EC  81 mg Oral Daily  . buPROPion  150 mg Oral Daily  . Chlorhexidine Gluconate Cloth  6 each Topical Daily  . feeding supplement (ENSURE ENLIVE)  237 mL Oral BID BM  . feeding supplement (NEPRO CARB STEADY)  237 mL Oral Q24H  . heparin  5,000 Units Subcutaneous Q8H  . mouth rinse  15 mL Mouth Rinse BID  . mirtazapine  15 mg Oral QHS  . multivitamin with minerals  1 tablet Oral Daily  . pantoprazole  40 mg Oral BID  . polyethylene glycol  17 g Oral BID  . senna-docusate  2 tablet Oral BID  . tamsulosin  0.4 mg Oral QPC supper    BMET    Component Value Date/Time   NA 144 03/16/2020 0518   K 4.1 03/16/2020 0518   CL 109 03/16/2020 0518   CO2 26 03/16/2020 0518   GLUCOSE 93 03/16/2020 0518   GLUCOSE 98 12/06/2006 1059   BUN 87 (H) 03/16/2020 0518   CREATININE 4.66 (H) 03/16/2020 0518   CALCIUM 8.0 (L) 03/16/2020 0518   GFRNONAA 11 (L) 03/16/2020 0518   GFRAA 13 (L) 03/16/2020 0518   CBC    Component Value Date/Time   WBC 6.8 03/14/2020 0543   RBC 2.78 (L) 03/14/2020 0543   HGB 9.1 (L) 03/14/2020 0543   HCT 27.8 (L) 03/14/2020 0543   PLT 127 (L) 03/14/2020 0543   MCV 100.0 03/14/2020 0543   MCH 32.7 03/14/2020 0543   MCHC  32.7 03/14/2020 0543   RDW 13.8 03/14/2020 0543   LYMPHSABS 1.2 03/11/2020 0552   MONOABS 0.6 03/11/2020 0552   EOSABS 0.0 03/11/2020 0552   BASOSABS 0.0 03/11/2020 0552    Assessment/Plan:  1. AKI/CKD stage 3- renal failure has been progressive since September 2020. ARB held in January after episode of volume depletion and AKI/CKD.  He repeated has stated that he does not want any form of dialysis. Volume is stable.  1. Renal biopsy shows AL amyloidosis. This carries a typically poor renal prognosis and pt is not a good candidate for immunosuppression.  2. Pt has AKI on progressive CKD. Baseline creat most recently from Feb '21 is around 3.0. Creat here up to 4.6.  Not uremic at this time. AKI may be recovering hopefully. If not and renal fxn declines would need hospice. Good UOP. Will follow. 2. FTT- EGD with gastritis and esophagitis.  3. Depression- psych following. Medication changes made. 4. Hypoalbuminemia- prob nephrotic syndrome as well as protein and calorie malnutrition, severe. 5. HTN- stable. 6. Enlarged prostate with calcifications. Brunetta Jeans, MD 03/16/2020, 3:34 PM

## 2020-03-16 NOTE — Progress Notes (Signed)
Nutrition Follow-up  DOCUMENTATION CODES:   Severe malnutrition in context of acute illness/injury, Underweight  INTERVENTION:  - continue Ensure Enlive BID and Nepro Shake once/day. - continue to encourage PO intakes, as tolerated and desired. - liberalize from 2 gram Na to Regular diet.    NUTRITION DIAGNOSIS:   Severe Malnutrition related to acute illness(unknown etiology) as evidenced by moderate fat depletion, moderate muscle depletion, severe muscle depletion, percent weight loss. -ongoing  GOAL:   Patient will meet greater than or equal to 90% of their needs -unmet  MONITOR:   PO intake, Supplement acceptance, Labs, Weight trends, Other (Comment)(GOC, d/c plan)  ASSESSMENT:   84 y.o. male with medical history of BPH, hyperlipidemia, and GERD who presented to the ED with concerns of decreased p.o. intake and weakness. He is not a good historian overall. He is able to tolerate some fruit and cereal but has had a poor appetite and has not had a balanced meal for several weeks. He reported nausea and occasional vomiting that may be worse with food intake. He sometimes notices abdominal pain. He thinks he can hear his "stomach sloshing around" when he gets up to use the restroom. He reported losing 25 lb in the couple of weeks PTA. He also reported issues with balance and feeling like he is going to fall over when he stands up. Family history includes a brother with lung cancer but no other abdominal related cancer of which he is aware.  He has mainly been eating 0% over the past 1 week. MD approved diet liberalization. Ensure Enlive ordered BID and he has been accepting this supplement ~50% of the time offered. Nepro Shake was ordered once/day starting 4/13 and patient has accepted this supplement 90% of the time offered.   Flow sheet documentation indicates deep pitting edema to BLE. Weight is trending back toward admission weight. Current weight is 64.9 kg/143 lb and admission  (3/28) weight was 62.1 kg/137 lb.   Palliative Care following and last saw patient and talked with family on 4/19. Family is leaning toward d/c to residential hospice.    Labs reviewed; BUN: 87 mg/dl, creatinine: 4.66 mg/dl, Ca: 8 mg/dl, GFR: 13 ml/min. Medications reviewed; daily multivitamin with minerals, 1 packet miralax BID, 2 tablets senokot BID.    Diet Order:   Diet Order            Diet 2 gram sodium Room service appropriate? Yes; Fluid consistency: Thin  Diet effective now              EDUCATION NEEDS:   No education needs have been identified at this time  Skin:  Skin Assessment: Reviewed RN Assessment  Last BM:  4/18  Height:   Ht Readings from Last 1 Encounters:  03/01/20 6\' 1"  (1.854 m)    Weight:   Wt Readings from Last 1 Encounters:  03/16/20 64.9 kg    Ideal Body Weight:  83.6 kg  BMI:  Body mass index is 18.87 kg/m.  Estimated Nutritional Needs:   Kcal:  1870-2055 kcal  Protein:  80-95 grams  Fluid:  >/= 2 L/day     Jarome Matin, MS, RD, LDN, CNSC Inpatient Clinical Dietitian RD pager # available in [facility]  After hours/weekend pager # available in [facility]

## 2020-03-16 NOTE — Progress Notes (Signed)
PROGRESS NOTE    Alvin Ingram  SNK:[id number] DOB: October 21, 1936 DOA: 02/22/2020 PCP: Alvin Rail, MD  Brief Narrative Alvin Ingram a 83/M w/ BPHfollowed by urology, hyperlipidemia and GERD and other comorbidities who presented to [facility] ED as advised by his niecewith concerns fordecreased p.o. intake, suspected uncontrolled depression, weight loss,and generalizedweakness. Evaluated by his PCP about 2 weeksprior-CT of his abdomen in February (01/02/20)showed extensive sigmoid diverticulosis and a15 mm hypodense lesion of the distal body of the pancreas that was indeterminate. MRI abdomen (02/25/20) showeda cystic lesion of the pancreas -d/w patient's niece via phone and she was concerned that he might be depressed, recently lost his girlfriend, he admitted to feeling depressed without suicidal ideation. Seen by psych with recommendation to DC Elavil and start Zoloft. Appetite remained poor. Evaluated by PT OT with recommendation for SNF with 24-hour assistance/supervision. Receptive to going to SNF.   -Then developed Nausea and vomiting, , GI was consulted, EGD  4/5 showed esophagitis and hemorrhagic gastritis.    Path was notable for mild reactive gastropathy -Also seen by ENT due to concern for hoarseness in the setting of significant weight loss in a former smoker.  Laryngoscopy was unremarkable as well as CT neck -Subsequently he was noted to have worsening kidney function  -Seen by nephrology, they were concerned for nephrotic syndrome, renal biopsy performed 4/15 -4/16: pt became hypotensive and had a syncopal event -continues to have failure to thrive, refuses to eat or drink, refuses to get out of bed, at this time wea re considering SNF w/ Palliative care vs Hospice, pt has declined hemodialysis and is not felt to be an HD candidate at this time  Assessment & Plan:   Severe Failure to thrive in adult/ Weight loss/ Depression -Presented with poor oral intake, poor  appetite,weight loss, uncontrolled depression -Malignancy work-up unremarkable so far -Seen by psychiatry, they started Zoloft and discontinued Elavil -Seen by dietitian, encouraged increase oral protein intake -Also followed by palliative care, patient refused to have any discussions with them, palliative team recommended outpatient follow-up with palliative at SNF -Patient is very depressed and is not motivated and only wants to sleep, p.o. intake is very poor -Psychiatry reconsulted and subsequently discontinued sertraline and added Wellbutrin 150 mg p.o. daily for depression and mirtazapine 50 mg p.o. nightly for depression, insomnia and appetite stimulation -Continues to have poor appetite,  poor motivation to get out of bed, sit up etc -worsening failure to thrive and renal failure, nephrotic syndrome -At this time we are awaiting kidney biopsy, to rule out inflammatory glomerulopathies, regardless his decline continues, discussed role of hospice with patient's niece Alvin Ingram again 4/19 -await Biopsy results  AKI on CKD IV Severe hypoalbuminemia ?  Nephrotic syndrome -Presented with creatinine 3.24, proteuniuria -Baseline creatinine 2 with GFR 38 -BUN/creatinine remained relatively stable since admission however subsequently continued to worsen in the last week, creatinine up to 4.6 now -Appreciate nephrology consult, concern for nephrotic syndrome, renal biopsy performed 4/15 in radiology -Urine protein greater than 3 g, UPEP pending, serologies and complements, SPEP negative -Patient has continued to decline hemodialysis, this was discussed with him on multiple occasions, also has a niece and nephew are involved in his care, they are familiar with current situation, discussed overall poor prognosis, discussed CODE STATUS with niece, now DNR -Continue if he continues to decline, hospice is most appropriate -If improves and renal biopsy indicates a treatable cause of nephrotic syndrome  then plan would be for SNF with palliative care -Patient  has declined hemodialysis on numerous discussions and would not be appropriate as well -await Renal input today  Severe protein calorie malnutrition -minimal oral intake  Esophagitis/Hemorrhagic Gastritis -Continue PPI twice daily   chronic hoarseness  -CT neck and laryngoscopic exam was unremarkable by ENT  -ENT suspects hoarseness may be related to GERD  15 mm low attenuating lesion with fluid attenuation along the posterior distal body of the pancreas/B/L renal cysts -MRI shows cystic lesion of the pancreas, -Recommend surveillance withrepeat imagings in 2 years.  Resolved Hypovolemic hypernatremia with iV fluid -resolved with hydration   Chronic Diastolic CHF -Last 2D echo done on 12/17/2019 showed LVEF 65-70% With grade 1 diastolic dysfunction -Did receive Lasix in the early part of this admission which was subsequently stopped  Orthostatic Hypotension -Following diuresis, Lasix subsequently discontinued and he was given fluid bolus -Now monitor  BPH -Continue Flomax, PSA was 1.8  Ambulatory dysfunction with frequent falls -Has had 3 falls at home within the last 2 weeks per his niece -Lives alone, has not been able to appropriately take care of himself per his niece. -needs SNF vs Residential hospice at DC  Avascular necrosis of the left femoral neck without acute fracture or cortical collapse from CT abd/pelvis wo contrast 01/02/20.  Normocytic Anemia  -stable  Thrombocytopenia -mild, stable  DVT prophylaxis: Heparin subcutaneous Code Status: I discussed code status w/ patient again given ongoing decline, he wanted me to d/w family, discussed code status again w/ niece and next of kin Alvin Ingram 4/16, now DNR Family Communication: No family at bedside, d/w neice AlvinIngram 4/14, 4/16, 4/19 Disposition Plan: Dispo: The patient is from: Home              Anticipated d/c is to: SNF versus hospice               Anticipated d/c date is: To be determined              Patient currently not medically stable for discharge, has worsening renal failure and ongoing failure to thrive, renal biopsy pending   Consultants:   Gastroenterology  Palliative Care Medicine  ENT  Nephrology; reconsulted  Psychiatry    Procedures:  EGD 4/5 Impression:               - Z-line regular, 42 cm from the incisors.                           - LA Grade C esophagitis with no bleeding. Biopsied.                           - Gastritis with hemorrhage. Biopsied.                           - Extrinsic compression in the gastric antrum.                           - Normal duodenal bulb, first portion of the                            duodenum and second portion of the duodenum.   Antimicrobials:  Anti-infectives (From admission, onward)   None     Subjective: -No events overnight, continues to lay in bed with eyes closed, refuses to eat  or drink, refuses to get out of bed, minimally interactive, puts his covers back on and wants to be left alone  Objective: Vitals:   03/15/20 2214 03/16/20 0420 03/16/20 0425 03/16/20 1240  BP: (!) 101/52  108/63 (!) 105/56  Pulse: 83  84 83  Resp: 20   16  Temp: 97.8 F (36.6 C)  97.6 F (36.4 C) 97.9 F (36.6 C)  TempSrc: Oral   Oral  SpO2: 100%  100% 99%  Weight:  64.9 kg    Height:        Intake/Output Summary (Last 24 hours) at 03/16/2020 1518 Last data filed at 03/16/2020 1242 Gross per 24 hour  Intake 260 ml  Output 550 ml  Net -290 ml   Filed Weights   03/14/20 0532 03/15/20 0526 03/16/20 0420  Weight: 65.3 kg 65.4 kg 64.9 kg   Examination: Physical Exam: Gen: Frail thinly built elderly male laying in bed, awake, alert, oriented to self and place HEENT:  no JVD Lungs: Decreased breath sounds at both bases  CVS: S1-S2, regular rate rhythm  abd: soft, Non tender, non distended, BS present Extremities: 2+ edema Skin: no new rashes on exposed  skin Psych: Flat affect  Data Reviewed: I have personally reviewed following labs and imaging studies  CBC: Recent Labs  Lab 03/10/20 0524 03/11/20 0552 03/12/20 1215 03/13/20 0248 03/14/20 0543  WBC 6.3 6.0 6.8 7.2 6.8  NEUTROABS 4.2 4.2  --   --   --   HGB 12.6* 11.4* 10.4* 10.9* 9.1*  HCT 38.0* 35.0* 31.4* 33.3* 27.8*  MCV 98.4 99.4 100.0 100.9* 100.0  PLT 140* 158 129* 133* 295*   Basic Metabolic Panel: Recent Labs  Lab 03/10/20 0524 03/11/20 0552 03/12/20 0513 03/13/20 0248 03/14/20 0543 03/15/20 0517 03/16/20 0518  NA 144   < > 147* 145 145 145 144  K 4.2   < > 4.7 4.2 4.1 4.3 4.1  CL 107   < > 110 110 109 108 109  CO2 27   < > 28 27 27 28 26   GLUCOSE 96   < > 110* 103* 88 107* 93  BUN 81*   < > 87* 87* 85* 95* 87*  CREATININE 3.76*   < > 4.12* 4.13* 4.45* 4.60* 4.66*  CALCIUM 8.1*   < > 8.3* 8.1* 8.2* 8.2* 8.0*  MG 2.6*  --   --   --   --   --   --   PHOS 4.9*   < > 4.4 4.9* 5.2* 4.8* 4.5   < > = values in this interval not displayed.   GFR: Estimated Creatinine Clearance: 11.4 mL/min (A) (by C-G formula based on SCr of 4.66 mg/dL (H)). Liver Function Tests: Recent Labs  Lab 03/10/20 0524 03/11/20 0552 03/12/20 0513 03/13/20 0248 03/14/20 0543 03/15/20 0517 03/16/20 0518  AST 22  --   --   --   --   --   --   ALT 28  --   --   --   --   --   --   ALKPHOS 115  --   --   --   --   --   --   BILITOT 0.3  --   --   --   --   --   --   PROT 4.0*  --   --   --   --   --   --   ALBUMIN 1.8*   < > 1.8*  1.7* 1.7* 1.7* 1.5*   < > = values in this interval not displayed.   No results for input(s): LIPASE, AMYLASE in the last 168 hours. No results for input(s): AMMONIA in the last 168 hours. Coagulation Profile: Recent Labs  Lab 03/11/20 0552  INR 1.0   Cardiac Enzymes: No results for input(s): CKTOTAL, CKMB, CKMBINDEX, TROPONINI in the last 168 hours. BNP (last 3 results) Recent Labs    12/29/19 1639 02/13/20 1431  PROBNP 224.0* 84.0    HbA1C: No results for input(s): HGBA1C in the last 72 hours. CBG: Recent Labs  Lab 03/12/20 1103  GLUCAP 104*   Lipid Profile: No results for input(s): CHOL, HDL, LDLCALC, TRIG, CHOLHDL, LDLDIRECT in the last 72 hours. Thyroid Function Tests: No results for input(s): TSH, T4TOTAL, FREET4, T3FREE, THYROIDAB in the last 72 hours. Anemia Panel: No results for input(s): VITAMINB12, FOLATE, FERRITIN, TIBC, IRON, RETICCTPCT in the last 72 hours. Sepsis Labs: No results for input(s): PROCALCITON, LATICACIDVEN in the last 168 hours.  Recent Results (from the past 240 hour(s))  MRSA PCR Screening     Status: None   Collection Time: 03/12/20 11:35 AM   Specimen: Nasal Mucosa; Nasopharyngeal  Result Value Ref Range Status   MRSA by PCR NEGATIVE NEGATIVE Final    Comment:        The GeneXpert MRSA Assay (FDA approved for NASAL specimens only), is one component of a comprehensive MRSA colonization surveillance program. It is not intended to diagnose MRSA infection nor to guide or monitor treatment for MRSA infections. Performed at [facility], [place] [address], [place], [place] [address]      RN Pressure Injury Documentation:     Estimated body mass index is 18.87 kg/m as calculated from the following:   Height as of this encounter: 6\' 1"  (1.854 m).   Weight as of this encounter: 64.9 kg.  Malnutrition Type:  Nutrition Problem: Severe Malnutrition Etiology: acute illness(unknown etiology)   Malnutrition Characteristics:  Signs/Symptoms: moderate fat depletion, moderate muscle depletion, severe muscle depletion, percent weight loss Percent weight loss: 20 %   Nutrition Interventions:  Interventions: Ensure Enlive (each supplement provides 350kcal and 20 grams of protein), Nepro shake, Magic cup   Radiology Studies: No results found. Scheduled Meds: . aspirin EC  81 mg Oral Daily  . buPROPion  150 mg Oral Daily  . Chlorhexidine Gluconate Cloth   6 each Topical Daily  . feeding supplement (ENSURE ENLIVE)  237 mL Oral BID BM  . feeding supplement (NEPRO CARB STEADY)  237 mL Oral Q24H  . heparin  5,000 Units Subcutaneous Q8H  . mouth rinse  15 mL Mouth Rinse BID  . mirtazapine  15 mg Oral QHS  . multivitamin with minerals  1 tablet Oral Daily  . pantoprazole  40 mg Oral BID  . polyethylene glycol  17 g Oral BID  . senna-docusate  2 tablet Oral BID  . tamsulosin  0.4 mg Oral QPC supper   Continuous Infusions:   LOS: 21 days   Domenic Polite, MD [facility]

## 2020-03-16 NOTE — Progress Notes (Signed)
Pt today has continued to refuse offers of eating any meals brought to room. Pt states "I don't want any of it" RN was able to encourage Pt and take an Ensure x 2 today

## 2020-03-17 DIAGNOSIS — E854 Organ-limited amyloidosis: Secondary | ICD-10-CM

## 2020-03-17 DIAGNOSIS — E43 Unspecified severe protein-calorie malnutrition: Secondary | ICD-10-CM

## 2020-03-17 DIAGNOSIS — N08 Glomerular disorders in diseases classified elsewhere: Secondary | ICD-10-CM

## 2020-03-17 DIAGNOSIS — N049 Nephrotic syndrome with unspecified morphologic changes: Secondary | ICD-10-CM

## 2020-03-17 DIAGNOSIS — D649 Anemia, unspecified: Secondary | ICD-10-CM

## 2020-03-17 LAB — RENAL FUNCTION PANEL
Albumin: 1.7 g/dL — ABNORMAL LOW (ref 3.5–5.0)
Anion gap: 12 (ref 5–15)
BUN: 97 mg/dL — ABNORMAL HIGH (ref 8–23)
CO2: 25 mmol/L (ref 22–32)
Calcium: 8.1 mg/dL — ABNORMAL LOW (ref 8.9–10.3)
Chloride: 107 mmol/L (ref 98–111)
Creatinine, Ser: 4.91 mg/dL — ABNORMAL HIGH (ref 0.61–1.24)
GFR calc Af Amer: 12 mL/min — ABNORMAL LOW (ref >60)
GFR calc non Af Amer: 10 mL/min — ABNORMAL LOW (ref >60)
Glucose, Bld: 102 mg/dL — ABNORMAL HIGH (ref 70–99)
Phosphorus: 5.5 mg/dL — ABNORMAL HIGH (ref 2.5–4.6)
Potassium: 4.3 mmol/L (ref 3.5–5.1)
Sodium: 144 mmol/L (ref 135–145)

## 2020-03-17 LAB — HEMOGLOBIN AND HEMATOCRIT, BLOOD
HCT: 18.4 % — ABNORMAL LOW (ref 39.0–52.0)
Hemoglobin: 6 g/dL — CL (ref 13.0–17.0)

## 2020-03-17 LAB — PREPARE RBC (CROSSMATCH)

## 2020-03-17 LAB — ABO/RH: ABO/RH(D): B NEG

## 2020-03-17 LAB — CBC
HCT: 18.1 % — ABNORMAL LOW (ref 39.0–52.0)
Hemoglobin: 5.9 g/dL — CL (ref 13.0–17.0)
MCH: 33.5 pg (ref 26.0–34.0)
MCHC: 32.6 g/dL (ref 30.0–36.0)
MCV: 102.8 fL — ABNORMAL HIGH (ref 80.0–100.0)
Platelets: 123 10*3/uL — ABNORMAL LOW (ref 150–400)
RBC: 1.76 MIL/uL — ABNORMAL LOW (ref 4.22–5.81)
RDW: 14.1 % (ref 11.5–15.5)
WBC: 8.5 10*3/uL (ref 4.0–10.5)
nRBC: 0 % (ref 0.0–0.2)

## 2020-03-17 MED ORDER — SODIUM CHLORIDE 0.9% IV SOLUTION: Freq: Once | INTRAVENOUS | Status: AC

## 2020-03-17 NOTE — TOC Progression Note (Signed)
Transition of Care [facility]) - Progression Note    Patient Details  Name: Alvin Ingram MRN: [id number] Date of Birth: 1936-01-22  Transition of Care [facility]) CM/SW Contact  Lashanta Elbe, Juliann Pulse, RN Phone Number: 03/17/2020, 2:37 PM  Clinical Narrative:Referral for residential hospice-rep Judeen Hammans following to assessment-provided w/niece tel# [employer] [phone].DNR signed in shadow chart.       Expected Discharge Plan: [place] Barriers to Discharge: Continued Medical Work up  Expected Discharge Plan and Services Expected Discharge Plan: [place]   Discharge Planning Services: CM Consult Post Acute Care Choice: Hospice Living arrangements for the past 2 months: Single Family Home Expected Discharge Date: 02/28/20                                     Social Determinants of Health (SDOH) Interventions    Readmission Risk Interventions No flowsheet data found.

## 2020-03-17 NOTE — Progress Notes (Signed)
PROGRESS NOTE    Alvin Ingram  PNT:[id number] DOB: [birth year] DOA: 02/22/2020 PCP: Binnie Rail, MD   Brief Narrative:  Alvin Ingram a 83/M w/ BPHfollowed by urology, hyperlipidemia and GERD and other comorbidities who [facility] advised by his niecewith concerns fordecreased p.o. intake, suspected uncontrolled depression, weight loss,and generalizedweakness. Evaluated by his PCP about 2 weeksprior-CT of his abdomen in February (01/02/20)showed extensive sigmoid diverticulosis and a15 mm hypodense lesion of the distal body of the pancreas that was indeterminate. MRI abdomen (02/25/20) showeda cystic lesion of the pancreas -d/w patient's niece via phone and she was concerned that he might be depressed, recently lost his girlfriend, he admitted to feeling depressed without suicidal ideation. Seen by psych with recommendation to DC Elavil and start Zoloft. Appetiteremained poor. Evaluated by PT OT with recommendation for SNF with 24-hour assistance/supervision.Receptive to going to SNF. -Then developed Nausea and vomiting, , GI was consulted, EGD  4/5 showed esophagitis and hemorrhagic gastritis.   Path was notable for mild reactive gastropathy -Also seen by ENT due to concern for hoarseness in the setting of significant weight loss in a former smoker.  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  Patient remains confused and his creatinine is worsening.  Because his renal function is worsening nephrology recommends that patient will likely need hospice.  Hospice referral has been made.  This morning the patient's blood count had dropped to 5.9 and repeat was 6.0 so we will be type and screen and transfuse 1 unit PRBCs.  All further blood work will be stopped given the patient likely transfer to residential hospice as he is confused and unable to make his own decisions now as he is likely becoming more more uremic.  Assessment & Plan:   Principal Problem:   Major depressive disorder, recurrent episode, moderate (HCC) Active Problems:   AKI (acute kidney injury) ([place])   Failure to thrive in adult   Transaminitis   Weakness   Hypernatremia   Protein-calorie malnutrition, severe  Severe Failure to thrive in adult/ Weight loss/ Depression -Presented with poor oral intake, poor appetite,weight loss, uncontrolled depression -Malignancy work-up unremarkable so far -Seen by psychiatry, they started Zoloft and discontinued Elavil -Seen by dietitian, encouraged increase oral protein intake -Also followed by palliative care, patient refused to have any discussions with them, palliative team recommended outpatient follow-up with palliative at SNF -Patient is very depressed and is not motivated and only wants to sleep, p.o. intake is very poor -Psychiatry reconsulted and subsequently discontinued sertraline and added Wellbutrin 150 mg p.o. daily for depression and mirtazapine 50 mg p.o. nightly for depression, insomnia and appetite stimulation -Continues to have poor appetite,  poor motivation to get out of bed, sit up etc -worsening failure to thrive and renal failure, nephrotic syndrome -At this time we are awaiting kidney biopsy, to rule out inflammatory glomerulopathies, regardless his decline  continues, discussed role of hospice with  patient's niece [name] again 4/19 -Biopsy results showed AL amyloidosis but has a poor prognosis.  We will consult palliative care for further goals of care discussion and I have elected to transfer the patient to residential hospice given that he is more confused  AKI on CKD IV Severe hypoalbuminemia ?  Nephrotic syndrome -Presented with creatinine 3.24, proteuniuria -Baseline creatinine 2 with GFR 38 -BUN/creatinine remained relatively stable since admission however subsequently continued to worsen in the last week, creatinine up to 4.6 now -Appreciate nephrology consult, concern for nephrotic syndrome, renal biopsy performed 4/15 in radiology -Urine protein greater than 3 g, UPEP pending, serologies and complements, SPEP negative -Patient has continued to decline hemodialysis, this was discussed with him on multiple occasions, also has a niece and nephew are involved in his care, they are familiar with current situation, discussed overall poor prognosis, discussed CODE STATUS with niece, now DNR -Patient continues to decline and hospice is most appropriate now -if he had improveed and renal biopsy indicates a treatable cause of nephrotic syndrome then plan would be for SNF with palliative care however 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 prognosis and nephrology recommends that since he is averse to dialysis that the best course of action would be to transfer to residential hospice given that he is worsening with his renal function is more confused and becoming uremic -Patient has declined hemodialysis on numerous discussions and would not be appropriate as well -Renal has nothing else to offer and has signed off the case  Severe protein calorie malnutrition -Continues to have minimal oral intake  -Albumin was 1.7 -We will need residential hospice given that is not eating out  Esophagitis/Hemorrhagic Gastritis -Continue PPI twice daily for now  Chronic Hoarseness   -CT neck and laryngoscopic exam was unremarkable by ENT  -ENT suspects hoarseness may be related to GERD -Patient will be transferred to Residential hospice now  15 mm low attenuating lesion with fluid attenuation along the posterior distal body of the pancreas/B/L renal cysts -MRI shows cystic lesion of the pancreas, -Recommend surveillance withrepeat imagings in 2 years.  Resolved Hypovolemic hypernatremia with iV fluid -resolved with hydration but this is now stopped -Patient to be transferred to residential hospice in the a.m. if bed is available  Chronic Diastolic CHF -Last 2D echo done on 12/17/2019 showed LVEF 65-70% With grade 1 diastolic dysfunction -Did receive Lasix in the early part of this admission which was subsequently stopped -Continue to monitor carefully patient remains significantly volume overloaded  Orthostatic Hypotension -Following diuresis, Lasix subsequently discontinued and he was given fluid bolus -Continue to monitor carefully  BPH -Continue Flomax for now, PSA was 1.8  Ambulatory dysfunction with frequent falls -Has had 3 falls at home within the last 2 weeks per his niece -Lives alone, has not been able to appropriately take care of himself per his niece. -needed SNF vs Residential hospice at [place] is looking like that patient will be transferred to hospice now and hopefully to be can place in the a.m.  Avascular necrosis of the left femoral neck without acute fracture or cortical collapse from CT abd/pelvis wo contrast 01/02/20. -PT OT was recommending SNF however will need residential hospice at discharge now  Normocytic Anemia  -Hemoglobin/hematocrit dropped and hemoglobin went from 9.1/27.8 and dropped down to 5.9/18.1; H&H this morning was 6.0/18.4 -Type and screen and transfuse 1 unit PRBCs -We will not repeat hemoglobin/hematocrit as patient is to be  transferred to residential hospice in the a.m. if bed is available  Thrombocytopenia  -Was mild and currently stable with a platelet count of 123,000 -Continue to monitor for signs and symptoms of bleeding; currently no overt bleeding noted  DVT prophylaxis: Was on heparin 5000 units subcu however this is now stopped given his acute drop in blood Code Status: DO NOT RESUSCITATE Family Communication: No family present at bedside and Dr. Domingo Cocking of palliative care has updated the niece Disposition Plan: Transfer to hospice in the a.m. if bed is available  Consultants:   Gastroenterology  Palliative Care Medicine  ENT  Nephrology; reconsulted  Psychiatry    Procedures:  EGD 4/5 Impression: - Z-line regular, 42 cm from the incisors. - LA Grade C esophagitis with no bleeding. Biopsied. - Gastritis with hemorrhage. Biopsied. - Extrinsic compression in the gastric antrum. - Normal duodenal bulb, first portion of the  duodenum and second portion of the duodenum.   Antimicrobials:  Anti-infectives (From admission, onward)   None     Subjective: And examined at bedside he is more confused and agitated likely more uremic.  Prognosis is not very good is not eating very much and he continues to be severely depressed.  After goals of care discussion was had with the palliative care team and the patient's family decision was made to transfer the patient to identify hospice in hopes of transitioning to residential hospice in the a.m at [facility].   Objective: Vitals:   03/17/20 0545 03/17/20 1236 03/17/20 1742 03/17/20 1820  BP: (!) 92/48 (!) 85/56 (!) 93/51 (!) 79/51  Pulse: 84 84 81 80  Resp: 14 16 18 16   Temp: (!) 97.2 F (36.2 C) (!) 97.5 F (36.4 C) 97.6 F (36.4 C) (!) 97.4 F (36.3 C)  TempSrc: Oral Oral Oral Oral  SpO2: 96% 100% 100% 100%  Weight:      Height:        Intake/Output Summary (Last 24 hours) at  03/17/2020 1928 Last data filed at 03/17/2020 1900 Gross per 24 hour  Intake 357 ml  Output 300 ml  Net 57 ml   Filed Weights   03/15/20 0526 03/16/20 0420 03/17/20 0500  Weight: 65.4 kg 64.9 kg 65.1 kg   Examination: Physical Exam:  Constitutional: Thin elderly severely depressed African-American male who is more confused but is resting Eyes: Lids and conjunctivae normal, sclerae anicteric  ENMT: External Ears, Nose appear normal. Grossly normal hearing.  Neck: Appears normal, supple, no cervical masses, normal ROM, no appreciable thyromegaly; no JVD Respiratory: Diminished to auscultation bilaterally, no wheezing, rales, rhonchi or crackles. Normal respiratory effort and patient is not tachypenic. No accessory muscle use.  Unlabored breathing Cardiovascular: RRR, no murmurs / rubs / gallops. S1 and S2 auscultated.  2+ extremity edema  Abdomen: Soft, non-tender, non-distended. Bowel sounds positive.  GU: Deferred. Musculoskeletal: No clubbing / cyanosis of digits/nails. No joint deformity upper and lower extremities.  Skin: No rashes, lesions, ulcers on limited skin evaluation. No induration; Warm and dry.  Neurologic: CN 2-12 grossly intact with no focal deficits.  Romberg sign and cerebellar reflexes not assessed.  Psychiatric: Impaired judgment and insight. Alert and but he is not oriented x3.  Slightly agitated mood and appropriate affect.   Data Reviewed: I have personally reviewed following labs and imaging studies  CBC: Recent Labs  Lab 03/11/20 0552 03/11/20 0552 03/12/20 1215 03/13/20 0248 03/14/20 0543 03/17/20 0540 03/17/20 0703  WBC 6.0  --  6.8 7.2 6.8 8.5  --  NEUTROABS 4.2  --   --   --   --   --   --   HGB 11.4*   < > 10.4* 10.9* 9.1* 5.9* 6.0*  HCT 35.0*   < > 31.4* 33.3* 27.8* 18.1* 18.4*  MCV 99.4  --  100.0 100.9* 100.0 102.8*  --   PLT 158  --  129* 133* 127* 123*  --    < > = values in this interval not displayed.   Basic Metabolic Panel: Recent  Labs  Lab 03/13/20 0248 03/14/20 0543 03/15/20 0517 03/16/20 0518 03/17/20 0540  NA 145 145 145 144 144  K 4.2 4.1 4.3 4.1 4.3  CL 110 109 108 109 107  CO2 27 27 28 26 25   GLUCOSE 103* 88 107* 93 102*  BUN 87* 85* 95* 87* 97*  CREATININE 4.13* 4.45* 4.60* 4.66* 4.91*  CALCIUM 8.1* 8.2* 8.2* 8.0* 8.1*  PHOS 4.9* 5.2* 4.8* 4.5 5.5*   GFR: Estimated Creatinine Clearance: 10.9 mL/min (A) (by C-G formula based on SCr of 4.91 mg/dL (H)). Liver Function Tests: Recent Labs  Lab 03/13/20 0248 03/14/20 0543 03/15/20 0517 03/16/20 0518 03/17/20 0540  ALBUMIN 1.7* 1.7* 1.7* 1.5* 1.7*   No results for input(s): LIPASE, AMYLASE in the last 168 hours. No results for input(s): AMMONIA in the last 168 hours. Coagulation Profile: Recent Labs  Lab 03/11/20 0552  INR 1.0   Cardiac Enzymes: No results for input(s): CKTOTAL, CKMB, CKMBINDEX, TROPONINI in the last 168 hours. BNP (last 3 results) Recent Labs    12/29/19 1639 02/13/20 1431  PROBNP 224.0* 84.0   HbA1C: No results for input(s): HGBA1C in the last 72 hours. CBG: Recent Labs  Lab 03/12/20 1103  GLUCAP 104*   Lipid Profile: No results for input(s): CHOL, HDL, LDLCALC, TRIG, CHOLHDL, LDLDIRECT in the last 72 hours. Thyroid Function Tests: No results for input(s): TSH, T4TOTAL, FREET4, T3FREE, THYROIDAB in the last 72 hours. Anemia Panel: No results for input(s): VITAMINB12, FOLATE, FERRITIN, TIBC, IRON, RETICCTPCT in the last 72 hours. Sepsis Labs: No results for input(s): PROCALCITON, LATICACIDVEN in the last 168 hours.  Recent Results (from the past 240 hour(s))  MRSA PCR Screening     Status: None   Collection Time: 03/12/20 11:35 AM   Specimen: Nasal Mucosa; Nasopharyngeal  Result Value Ref Range Status   MRSA by PCR NEGATIVE NEGATIVE Final    Comment:        The GeneXpert MRSA Assay (FDA approved for NASAL specimens only), is one component of a comprehensive MRSA colonization surveillance program. It  is not intended to diagnose MRSA infection nor to guide or monitor treatment for MRSA infections. Performed at [facility], [place] [address], [place], [place] [address]     RN Pressure Injury Documentation:     Estimated body mass index is 18.94 kg/m as calculated from the following:   Height as of this encounter: 6\' 1"  (1.854 m).   Weight as of this encounter: 65.1 kg.  Malnutrition Type:  Nutrition Problem: Severe Malnutrition Etiology: acute illness(unknown etiology)   Malnutrition Characteristics:  Signs/Symptoms: moderate fat depletion, moderate muscle depletion, severe muscle depletion, percent weight loss Percent weight loss: 20 %   Nutrition Interventions:  Interventions: Ensure Enlive (each supplement provides 350kcal and 20 grams of protein), Nepro shake, Magic cup   Radiology Studies: No results found.  Scheduled Meds: . buPROPion  150 mg Oral Daily  . Chlorhexidine Gluconate Cloth  6 each Topical Daily  .  feeding supplement (ENSURE ENLIVE)  237 mL Oral BID BM  . feeding supplement (NEPRO CARB STEADY)  237 mL Oral Q24H  . mouth rinse  15 mL Mouth Rinse BID  . mirtazapine  15 mg Oral QHS  . multivitamin with minerals  1 tablet Oral Daily  . pantoprazole  40 mg Oral BID  . polyethylene glycol  17 g Oral BID  . senna-docusate  2 tablet Oral BID  . tamsulosin  0.4 mg Oral QPC supper   Continuous Infusions:   LOS: 22 days   Kerney Elbe, DO [facility] PAGER is on AMION  If 7PM-7AM, please contact night-coverage [email or web address]

## 2020-03-17 NOTE — Progress Notes (Signed)
Manufacturing engineer [facility]) Hospital Liaison Note:  Received request from [place]  for family interest in [facility]. Chart reviewed and spoke with family to acknowledge referral, answer questions and explain services.  Family agreeable to transfer to [facility] tomorrow if bed available. Family and TOC  are aware Manufacturing engineer liaison will follow up with CSW and family tomorrow..   Please do not hesitate to call with hospice related questions,  Gar Ponto, RN [place] [facility] (on [place]) [phone]

## 2020-03-17 NOTE — Progress Notes (Signed)
OT Cancellation Note  Patient Details Name: Alvin Ingram MRN: [id number] DOB: [birth year]   Cancelled Treatment:    Reason Eval/Treat Not Completed: Medical issues which prohibited therapy. Hgb 6.0 this morning, transfusion planned per nurse. Plan to reattempt.   Tyrone Schimke, OT Acute Rehabilitation Services Pager: [phone] Office: [phone]  03/17/2020, 10:04 AM

## 2020-03-17 NOTE — Progress Notes (Signed)
Spoke with Miles Costain, niece, on the phone to discuss updates and plan of care.

## 2020-03-17 NOTE — Progress Notes (Signed)
Daily Progress Note   Patient Name: Alvin Ingram       Date: 03/17/2020 DOB: 12-13-1935  Age: 84 y.o. MRN#: [id number] Attending Physician: Alvin Elbe, DO Primary Care Physician: Alvin Rail, MD Admit Date: 02/22/2020  Reason for Consultation/Follow-up: Establishing goals of care  Subjective:  Chart reviewed.  Discussed case with Alvin Ingram.  I saw and examined Alvin Ingram.  He is confused and agitated during my encounter.  He does not want to to discuss care plan.   I called and was able to reach his niece, Alvin Ingram. We discussed clinical course as well as wishes moving forward in regard to care plan in light of his worsening renal failure with AL amyloid and refusal of dialysis.  We discussed difference between a aggressive medical intervention path and a palliative, comfort focused care path.    Concept of Hospice and residential hospice in paticular discussed.  His niece reports that she understands his situation and that residential hospice is really only option for him moving forward.  She will discuss with his other family (her brother and patient's brother) and would like me to begin referral process today.      Questions and concerns addressed.   PMT will continue to support holistically.   Length of Stay: 22  Current Medications: Scheduled Meds:  . buPROPion  150 mg Oral Daily  . Chlorhexidine Gluconate Cloth  6 each Topical Daily  . feeding supplement (ENSURE ENLIVE)  237 mL Oral BID BM  . feeding supplement (NEPRO CARB STEADY)  237 mL Oral Q24H  . mouth rinse  15 mL Mouth Rinse BID  . mirtazapine  15 mg Oral QHS  . multivitamin with minerals  1 tablet Oral Daily  . pantoprazole  40 mg Oral BID  . polyethylene glycol  17 g Oral BID  . senna-docusate  2 tablet  Oral BID  . tamsulosin  0.4 mg Oral QPC supper    Continuous Infusions:   PRN Meds: acetaminophen, LORazepam, ondansetron (ZOFRAN) IV, promethazine  Physical Exam         General: Frail and weak Heart: Regular rate and rhythm. No murmur appreciated. Lungs: Good air movement, clear Abdomen: Soft, nontender, nondistended, positive bowel sounds.  Ext: Some LE edema Skin: Warm and dry  Vital Signs: BP 110/62 (BP Location: Right  Arm)   Pulse 81   Temp (!) 97.3 F (36.3 C) (Oral)   Resp (!) 21   Ht 6\' 1"  (1.854 m)   Wt 65.1 kg   SpO2 100%   BMI 18.94 kg/m  SpO2: SpO2: 100 % O2 Device: O2 Device: Room Air O2 Flow Rate: O2 Flow Rate (L/min): 2 L/min  Intake/output summary:   Intake/Output Summary (Last 24 hours) at 03/17/2020 2212 Last data filed at 03/17/2020 2045 Gross per 24 hour  Intake 677 ml  Output 300 ml  Net 377 ml   LBM: Last BM Date: 03/17/20 Baseline Weight: Weight: 62.1 kg Most recent weight: Weight: 65.1 kg       Palliative Assessment/Data:    Flowsheet Rows     Most Recent Value  Intake Tab  Referral Department  Hospitalist  Unit at Time of Referral  Med/Surg Unit  Palliative Care Primary Diagnosis  Other (Comment) [Depression]  Date Notified  03/01/20  Palliative Care Type  New Palliative care  Reason for referral  Clarify Goals of Care  Date of Admission  02/22/20  Date first seen by Palliative Care  03/02/20  # of days Palliative referral response time  1 Day(s)  # of days IP prior to Palliative referral  8  Clinical Assessment  Palliative Performance Scale Score  50%  Psychosocial & Spiritual Assessment  Palliative Care Outcomes  Patient/Family meeting held?  Yes  Who was at the meeting?  Patient      Patient Active Problem List   Diagnosis Date Noted  . Major depressive disorder, recurrent episode, moderate ([place]) 03/07/2020  . Protein-calorie malnutrition, severe 02/24/2020  . Transaminitis 02/23/2020  . Weakness 02/23/2020  .  Hypernatremia 02/23/2020  . Weight loss 02/13/2020  . Epigastric pain 02/13/2020  . Fatigue 02/13/2020  . Physical deconditioning 02/13/2020  . Failure to thrive in adult 02/13/2020  . Lower abdominal pain 01/02/2020  . Bilateral leg edema 12/29/2019  . Constipation 12/29/2019  . Syncope 12/16/2019  . AKI (acute kidney injury) ([place]) 12/16/2019  . Hyperglycemia 08/18/2019  . BPH (benign prostatic hyperplasia) 05/31/2010  . THROMBOCYTOPENIA, CHRONIC 05/24/2009  . PREMATURE ATRIAL CONTRACTIONS 05/24/2009  . GERD 05/21/2008  . Nocturia 05/21/2008  . Hyperlipidemia 04/23/2007  . History of colonic polyps 03/15/2007    Palliative Care Assessment & Plan   Patient Profile:    Assessment: Acute kidney injury, stage III chronic kidney disease with progressive renal function decline, uremic symptoms. Renal services following patient suspected to have ATN possible nephrotic syndrome as well. Failure to thrive EGD with gastritis and esophagitis patient on PPI Episode of acute respiratory distress/agonal breathing requiring monitoring in stepdown unit on 4-16 Depression Hypoalbuminemia Functional decline.   Recommendations/Plan:  Renal biopsy results revealed AL amyloid.  Patient is confused and refused to participate in conversation.  His niece is in agreement that residential hospice is likely the best option for Alvin Ingram moving forward.  She reports preference of family would be for [employer] [facility].  Referral placed.  Appreciate TOC assistance.   Code Status:    Code Status Orders  (From admission, onward)         Start     Ordered   03/12/20 1527  Do not attempt resuscitation (DNR)  Continuous    Question Answer Comment  In the event of cardiac or respiratory ARREST Do not call a "code blue"   In the event of cardiac or respiratory ARREST Do not perform Intubation, CPR, defibrillation or ACLS  In the event of cardiac or respiratory ARREST Use medication by any route,  position, wound care, and other measures to relive pain and suffering. May use oxygen, suction and manual treatment of airway obstruction as needed for comfort.      03/12/20 1529        Code Status History    Date Active Date Inactive Code Status Order ID Comments User Context   02/22/2020 1928 03/12/2020 1529 Full Code [id number]  Orene Desanctis, DO ED   12/16/2019 2043 12/20/2019 0027 Full Code [id number]  Phillips Grout, MD ED   Advance Care Planning Activity       Prognosis:   Likely less than 2 weeks with continued renal failure, uremia, and no real PO intake  Discharge Planning: Recommend residential hospice  Care plan was discussed with patient, bedside RN, niece via phone.   Thank you for allowing the Palliative Medicine Team to assist in the care of this patient.   Time In: 1340 Time Out: 1420 Total Time 40 Prolonged Time Billed No    Greater than 50%  of this time was spent counseling and coordinating care related to the above assessment and plan.   Micheline Rough, MD  Please contact Palliative Medicine Team phone at [phone] for questions and concerns.

## 2020-03-17 NOTE — Progress Notes (Addendum)
Critical value of hemoglobin 5.9, notified Sharlet Salina, NP. H&H will be redrawn stat d/t being significant drop from last value. No signs of active bleeding and pt asymptomatic at this time. His bp is soft at 92/48, pulse 84, 02 96 on RA. Continued to have poor oral intake this shift.   Hemoglobin redraw critical result 6.0. Notified Dr. Alfredia Ferguson.

## 2020-03-17 NOTE — Progress Notes (Signed)
Patient ID: Alvin Ingram, male   DOB: [birth year], 84 y.o.   MRN: [id number] S: UOP down 150 cc recorded yest.  B/Cr up 97/ 4.91.  Hb down 6.0  O:BP (!) 92/48 (BP Location: Left Arm) Comment: notified NP  Pulse 84   Temp (!) 97.2 F (36.2 C) (Oral)   Resp 14   Ht 6\' 1"  (1.854 m)   Wt 65.1 kg   SpO2 96%   BMI 18.94 kg/m   Intake/Output Summary (Last 24 hours) at 03/17/2020 1034 Last data filed at 03/17/2020 0900 Gross per 24 hour  Intake 474 ml  Output 350 ml  Net 124 ml   Intake/Output: I/O last 3 completed shifts: In: 644 [P.O.:594] Out: 650 [Urine:650]  Intake/Output this shift:  No intake/output data recorded. Weight change: 0.236 kg Gen: frail, cachectic male lying in bed in NAD Refused exam  Recent Labs  Lab 03/11/20 0552 03/12/20 0513 03/13/20 0248 03/14/20 0543 03/15/20 0517 03/16/20 0518 03/17/20 0540  NA 145  146* 147* 145 145 145 144 144  K 3.9  3.9 4.7 4.2 4.1 4.3 4.1 4.3  CL 108  109 110 110 109 108 109 107  CO2 29  26 28 27 27 28 26 25   GLUCOSE 103*  105* 110* 103* 88 107* 93 102*  BUN 81*  83* 87* 87* 85* 95* 87* 97*  CREATININE 3.67*  3.63* 4.12* 4.13* 4.45* 4.60* 4.66* 4.91*  ALBUMIN 1.7* 1.8* 1.7* 1.7* 1.7* 1.5* 1.7*  CALCIUM 8.2*  8.2* 8.3* 8.1* 8.2* 8.2* 8.0* 8.1*  PHOS 4.1 4.4 4.9* 5.2* 4.8* 4.5 5.5*   Liver Function Tests: Recent Labs  Lab 03/15/20 0517 03/16/20 0518 03/17/20 0540  ALBUMIN 1.7* 1.5* 1.7*   No results for input(s): LIPASE, AMYLASE in the last 168 hours. No results for input(s): AMMONIA in the last 168 hours. CBC: Recent Labs  Lab 03/11/20 0552 03/11/20 0552 03/12/20 1215 03/12/20 1215 03/13/20 0248 03/13/20 0248 03/14/20 0543 03/17/20 0540 03/17/20 0703  WBC 6.0   < > 6.8   < > 7.2  --  6.8 8.5  --   NEUTROABS 4.2  --   --   --   --   --   --   --   --   HGB 11.4*   < > 10.4*   < > 10.9*   < > 9.1* 5.9* 6.0*  HCT 35.0*   < > 31.4*   < > 33.3*   < > 27.8* 18.1* 18.4*  MCV 99.4  --  100.0  --  100.9*  --   100.0 102.8*  --   PLT 158   < > 129*   < > 133*  --  127* 123*  --    < > = values in this interval not displayed.   Cardiac Enzymes: No results for input(s): CKTOTAL, CKMB, CKMBINDEX, TROPONINI in the last 168 hours. CBG: Recent Labs  Lab 03/12/20 1103  GLUCAP 104*    Iron Studies: No results for input(s): IRON, TIBC, TRANSFERRIN, FERRITIN in the last 72 hours. Studies/Results: No results found. . sodium chloride   Intravenous Once  . buPROPion  150 mg Oral Daily  . Chlorhexidine Gluconate Cloth  6 each Topical Daily  . feeding supplement (ENSURE ENLIVE)  237 mL Oral BID BM  . feeding supplement (NEPRO CARB STEADY)  237 mL Oral Q24H  . mouth rinse  15 mL Mouth Rinse BID  . mirtazapine  15 mg Oral QHS  . multivitamin with  minerals  1 tablet Oral Daily  . pantoprazole  40 mg Oral BID  . polyethylene glycol  17 g Oral BID  . senna-docusate  2 tablet Oral BID  . tamsulosin  0.4 mg Oral QPC supper    BMET    Component Value Date/Time   NA 144 03/17/2020 0540   K 4.3 03/17/2020 0540   CL 107 03/17/2020 0540   CO2 25 03/17/2020 0540   GLUCOSE 102 (H) 03/17/2020 0540   GLUCOSE 98 12/06/2006 1059   BUN 97 (H) 03/17/2020 0540   CREATININE 4.91 (H) 03/17/2020 0540   CALCIUM 8.1 (L) 03/17/2020 0540   GFRNONAA 10 (L) 03/17/2020 0540   GFRAA 12 (L) 03/17/2020 0540   CBC    Component Value Date/Time   WBC 8.5 03/17/2020 0540   RBC 1.76 (L) 03/17/2020 0540   HGB 6.0 (LL) 03/17/2020 0703   HCT 18.4 (L) 03/17/2020 0703   PLT 123 (L) 03/17/2020 0540   MCV 102.8 (H) 03/17/2020 0540   MCH 33.5 03/17/2020 0540   MCHC 32.6 03/17/2020 0540   RDW 14.1 03/17/2020 0540   LYMPHSABS 1.2 03/11/2020 0552   MONOABS 0.6 03/11/2020 0552   EOSABS 0.0 03/11/2020 0552   BASOSABS 0.0 03/11/2020 0552    Assessment/Plan:  1. AKI/CKD stage 3- renal failure has been progressive since September 2020.ARB held in January after episode of volume depletion and AKI/CKD.  He repeatedly has  stated that he does not want any form of dialysis. Has bilat LE edema.  1. 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 This carries a typically poor renal prognosis and pt is not a good candidate for immunosuppression.  2. Pt has AKI on progressive CKD. Baseline creat most recently from Feb '21 was 3.0. Creat here up to 4.6 yest and 4.9 today. BUN 90's. UOP is down yesterday. BP's soft. +LE edema. Pt agitated and refusing exam/ history. Would get palliative team involved, if renal fxn continues to decline pt would need hospice.  Have d/w primary team. No other suggestions.  Will follow from a distance.  2. FTT- EGD with gastritis and esophagitis.  3. Depression- severe depression. Medication changes made. 4. Hypoalbuminemia- prob nephrotic syndrome as well as protein and calorie malnutrition, severe. 5. HTN- stable. 6. Enlarged prostate with calcifications. Brunetta Jeans, MD 03/17/2020, 10:34 AM

## 2020-03-18 ENCOUNTER — Encounter (HOSPITAL_COMMUNITY): Payer: Self-pay | Admitting: Nephrology

## 2020-03-18 DIAGNOSIS — E8581 Light chain (AL) amyloidosis: Secondary | ICD-10-CM

## 2020-03-18 LAB — TYPE AND SCREEN
ABO/RH(D): B NEG
Antibody Screen: NEGATIVE
Unit division: 0

## 2020-03-18 LAB — BPAM RBC
Blood Product Expiration Date: 202105052359
ISSUE DATE / TIME: 202104211751
Unit Type and Rh: 1700

## 2020-03-18 LAB — SURGICAL PATHOLOGY

## 2020-03-18 MED ORDER — MIRTAZAPINE 15 MG PO TABS: 15.0000 mg | ORAL_TABLET | Freq: Every day | ORAL | 0 refills | Status: AC

## 2020-03-18 MED ORDER — NEPRO/CARBSTEADY PO LIQD: 237.0000 mL | ORAL | 0 refills | Status: AC

## 2020-03-18 MED ORDER — ACETAMINOPHEN 325 MG PO TABS: 650.0000 mg | ORAL_TABLET | Freq: Four times a day (QID) | ORAL | 0 refills | Status: AC | PRN

## 2020-03-18 MED ORDER — BUPROPION HCL ER (XL) 150 MG PO TB24: 150.0000 mg | ORAL_TABLET | Freq: Every day | ORAL | 0 refills | Status: AC

## 2020-03-18 MED ORDER — PANTOPRAZOLE SODIUM 40 MG PO TBEC: 40.0000 mg | DELAYED_RELEASE_TABLET | Freq: Two times a day (BID) | ORAL | 0 refills | Status: AC

## 2020-03-18 NOTE — TOC Transition Note (Signed)
Transition of Care [facility]) - CM/SW Discharge Note   Patient Details  Name: Gurshaan Matsuoka MRN: [id number] Date of Birth: 1936/10/29  [facility]) CM/SW Contact:  Dessa Phi, RN Phone Number: 03/18/2020, 10:07 AM   Clinical Narrative:[facility] rep MaryAnne-bed available.Await paperwork by niece to be completed,& d/c prior calling [facility].       Final next level of care: [place] Barriers to Discharge: No Barriers Identified   Patient Goals and CMS Choice Patient states their goals for this hospitalization and ongoing recovery are:: go to residential hospice CMS Medicare.gov Compare Post Acute Care list provided to:: Patient Represenative (must comment) Choice offered to / list presented to : [facility])  Discharge Placement              Patient chooses bed at: [facility]) Patient to be transferred to facility by: [place] Name of family member notified: Miles Costain [phone] Patient and family notified of of transfer: 03/18/20  Discharge Plan and Services   Discharge Planning Services: CM Consult Post Acute Care Choice: Hospice                               Social Determinants of Health (SDOH) Interventions     Readmission Risk Interventions No flowsheet data found.

## 2020-03-18 NOTE — Progress Notes (Signed)
[facility] Documentation  °   °Pt has been approved for [facility] transfer. [facility] does have a bed available for pt today. Paperwork has been completed and transportation can be arranged.   °   °Please call [facility] at [phone] to give charge nurse report and fax discharge summary to [phone].  °   °Please dc any lines. May leave catheter in place if pt has one. Please send pt to [facility] with DNR paperwork.   °   °Please call with any questions.   °   °Thank you,   °Jennifer Love, RN  ° °

## 2020-03-18 NOTE — Progress Notes (Signed)
Daily Progress Note   Patient Name: Alvin Ingram       Date: 03/18/2020 DOB: 07-22-1936  Age: 84 y.o. MRN#: [id number] Attending Physician: Kerney Elbe, DO Primary Care Physician: Binnie Rail, MD Admit Date: 02/22/2020  Reason for Consultation/Follow-up: Establishing goals of care  Subjective: I saw and examined Alvin Ingram.  He was sitting in bed with breakfast tray in from of him.   He reports that he wants to "get out of this place."  Discussed that if he wants to be out of the hospital and is not interested in dialysis, I recommend working to transition to [facility] as they will be able to care for him with a focus on his comfort moving forward.  He expressed that he is not interested in dialysis, and he is in agreement with plan to be out of the hospital and focus on comfort moving forward.   He is certainly more alert and interactive today, but I still question how much he understands of situation.  I then called and was able to reach his niece, Alvin Ingram.  I relayed my encounter to Mr. Codrington with her and we reviewed that we have exhausted all likely beneficial medical interventions as he is clear in not wanting dialysis and has been refusing care and requesting to be left alone.  She reports feeling better knowing that I was able to have more conversation with him today and he was able to voice desire for comfort and being out of the hospital (even if he is confused and does not fully comprehend).  She is awaiting paperwork from hospice and then hopefully he can transition to [facility] this afternoon.  Length of Stay: 23  Current Medications: Scheduled Meds:  . buPROPion  150 mg Oral Daily  . Chlorhexidine Gluconate Cloth  6 each Topical Daily  . feeding supplement (ENSURE ENLIVE)   237 mL Oral BID BM  . feeding supplement (NEPRO CARB STEADY)  237 mL Oral Q24H  . mouth rinse  15 mL Mouth Rinse BID  . mirtazapine  15 mg Oral QHS  . multivitamin with minerals  1 tablet Oral Daily  . pantoprazole  40 mg Oral BID  . polyethylene glycol  17 g Oral BID  . senna-docusate  2 tablet Oral BID  . tamsulosin  0.4 mg  Oral QPC supper    Continuous Infusions:   PRN Meds: acetaminophen, LORazepam, ondansetron (ZOFRAN) IV, promethazine  Physical Exam         General: Frail and weak Heart: Regular rate and rhythm. No murmur appreciated. Lungs: Good air movement, clear Abdomen: Soft, nontender, nondistended, positive bowel sounds.  Ext: Some LE edema Skin: Warm and dry  Vital Signs: BP 93/60 (BP Location: Right Arm)   Pulse 79   Temp 97.6 F (36.4 C)   Resp 16   Ht 6\' 1"  (1.854 m)   Wt 65.5 kg   SpO2 100%   BMI 19.04 kg/m  SpO2: SpO2: 100 % O2 Device: O2 Device: Room Air O2 Flow Rate: O2 Flow Rate (L/min): 2 L/min  Intake/output summary:   Intake/Output Summary (Last 24 hours) at 03/18/2020 1130 Last data filed at 03/18/2020 0201 Gross per 24 hour  Intake 440 ml  Output 300 ml  Net 140 ml   LBM: Last BM Date: 03/17/20 Baseline Weight: Weight: 62.1 kg Most recent weight: Weight: 65.5 kg       Palliative Assessment/Data:    Flowsheet Rows     Most Recent Value  Intake Tab  Referral Department  Hospitalist  Unit at Time of Referral  Med/Surg Unit  Palliative Care Primary Diagnosis  Other (Comment) [Depression]  Date Notified  03/01/20  Palliative Care Type  New Palliative care  Reason for referral  Clarify Goals of Care  Date of Admission  02/22/20  Date first seen by Palliative Care  03/02/20  # of days Palliative referral response time  1 Day(s)  # of days IP prior to Palliative referral  8  Clinical Assessment  Palliative Performance Scale Score  50%  Psychosocial & Spiritual Assessment  Palliative Care Outcomes  Patient/Family meeting held?   Yes  Who was at the meeting?  Patient      Patient Active Problem List   Diagnosis Date Noted  . Major depressive disorder, recurrent episode, moderate ([place]) 03/07/2020  . Protein-calorie malnutrition, severe 02/24/2020  . Transaminitis 02/23/2020  . Weakness 02/23/2020  . Hypernatremia 02/23/2020  . Weight loss 02/13/2020  . Epigastric pain 02/13/2020  . Fatigue 02/13/2020  . Physical deconditioning 02/13/2020  . Failure to thrive in adult 02/13/2020  . Lower abdominal pain 01/02/2020  . Bilateral leg edema 12/29/2019  . Constipation 12/29/2019  . Syncope 12/16/2019  . AKI (acute kidney injury) ([place]) 12/16/2019  . Hyperglycemia 08/18/2019  . BPH (benign prostatic hyperplasia) 05/31/2010  . THROMBOCYTOPENIA, CHRONIC 05/24/2009  . PREMATURE ATRIAL CONTRACTIONS 05/24/2009  . GERD 05/21/2008  . Nocturia 05/21/2008  . Hyperlipidemia 04/23/2007  . History of colonic polyps 03/15/2007    Palliative Care Assessment & Plan   Patient Profile:    Assessment: Acute kidney injury, stage III chronic kidney disease with progressive renal function decline, uremic symptoms. Renal services following patient suspected to have ATN possible nephrotic syndrome as well. Failure to thrive EGD with gastritis and esophagitis patient on PPI Episode of acute respiratory distress/agonal breathing requiring monitoring in stepdown unit on 4-16 Depression Hypoalbuminemia Functional decline.   Recommendations/Plan:  Alvin Ingram remains firm in continuing to decline consideration for dialysis.  I discussed with him plan to transition to [facility] with focus on comfort.  Discussed also with his niece who will be completing transfer paperwork for residential hospice.   Code Status:    Code Status Orders  (From admission, onward)         Start  Ordered   03/12/20 1527  Do not attempt resuscitation (DNR)  Continuous    Question Answer Comment  In the event of cardiac or respiratory  ARREST Do not call a "code blue"   In the event of cardiac or respiratory ARREST Do not perform Intubation, CPR, defibrillation or ACLS   In the event of cardiac or respiratory ARREST Use medication by any route, position, wound care, and other measures to relive pain and suffering. May use oxygen, suction and manual treatment of airway obstruction as needed for comfort.      03/12/20 1529        Code Status History    Date Active Date Inactive Code Status Order ID Comments User Context   02/22/2020 1928 03/12/2020 1529 Full Code [id number]  Orene Desanctis, DO ED   12/16/2019 2043 12/20/2019 0027 Full Code [id number]  Phillips Grout, MD ED   Advance Care Planning Activity       Prognosis:   Likely less than 2 weeks with continued renal failure, uremia, and no real PO intake  Discharge Planning: Recommend residential hospice  Care plan was discussed with patient, bedside RN, niece via phone.   Thank you for allowing the Palliative Medicine Team to assist in the care of this patient.   Time In: 1040 Time Out: 1120 Total Time 40 Prolonged Time Billed No    Greater than 50%  of this time was spent counseling and coordinating care related to the above assessment and plan.   Micheline Rough, MD  Please contact Palliative Medicine Team phone at [phone] for questions and concerns.

## 2020-03-18 NOTE — TOC Transition Note (Signed)
Transition of Care [facility]) - CM/SW Discharge Note   Patient Details  Name: Donis Pinder MRN: [id number] Date of Birth: 1936-06-18  Transition of Care [facility]) CM/SW Contact:  Dessa Phi, RN Phone Number: 03/18/2020, 12:40 PM   Clinical Narrative:  Corey Harold called for transport to [facility]. No further CM needs.     Final next level of care: [place] Barriers to Discharge: No Barriers Identified   Patient Goals and CMS Choice Patient states their goals for this hospitalization and ongoing recovery are:: go to residential hospice CMS Medicare.gov Compare Post Acute Care list provided to:: Patient Represenative (must comment) Choice offered to / list presented to : [facility])  Discharge Placement              Patient chooses bed at: [facility]) Patient to be transferred to facility by: [place] Name of family member notified: Miles Costain [phone] Patient and family notified of of transfer: 03/18/20  Discharge Plan and Services   Discharge Planning Services: CM Consult Post Acute Care Choice: Hospice                               Social Determinants of Health (SDOH) Interventions     Readmission Risk Interventions No flowsheet data found.

## 2020-03-18 NOTE — Discharge Summary (Signed)
Physician Discharge Summary  Alvin Ingram OMV:[id number] DOB: 07/18/1936 DOA: 02/22/2020  PCP: Alvin Rail, MD  Admit date: 02/22/2020 Discharge date: 03/18/2020  Admitted From: Home Disposition: Home with Hospice  Recommendations for Outpatient Follow-up:  1. Further Care per Hospice Protocol   Home Health: No Equipment/Devices: None  Discharge Condition: Guarded CODE STATUS: DO NOT RESUSCITATE  Diet recommendation: Regular Diet   Brief/Interim Summary: Alvin Whiteis a 83/M w/ BPHfollowed by urology, hyperlipidemia and GERD and other comorbidities who [facility] advised by his niecewith concerns fordecreased p.o. intake, suspected uncontrolled depression, weight loss,and generalizedweakness. Evaluated by his PCP about 2 weeksprior-CT of his abdomen in February (01/02/20)showed extensive sigmoid diverticulosis and a15 mm hypodense lesion of the distal body of the pancreas that was indeterminate. MRI abdomen (02/25/20) showeda cystic lesion of the pancreas -d/w patient's niece via phone and she was concerned that he might be depressed, recently lost his girlfriend, he admitted to feeling depressed without suicidal ideation. Seen by psych with recommendation to DC Elavil and start Zoloft. Appetiteremained poor. Evaluated by PT OT with recommendation for SNF with 24-hour assistance/supervision.Receptive to going to SNF. -Then developed Nausea and vomiting, , GI was consulted, EGD 4/5 showed esophagitis and hemorrhagic gastritis. Path was notable for mild reactive gastropathy -Also seen by ENT due to concern for hoarseness in the setting of significant weight loss in a former smoker. 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  Patient became confused and his creatinine is worsening.  Because his renal function is worsening nephrology recommends that patient will likely need hospice.  Hospice referral has been made.  This morning the patient's blood count had dropped to 5.9 and repeat was 6.0 so we will be type and screen and transfuse 1 unit PRBCs.  All further blood work will be stopped given the patient likely transfer to residential hospice as he is confused and unable to make his own decisions now as he is likely becoming more more uremic.  The patient is stable to be transferred to hospice today and will have further care per hospice protocol.  Discharge Diagnoses:  Principal Problem:   Major depressive disorder, recurrent episode, moderate (HCC) Active Problems:   AKI (acute kidney injury) ([place])   Failure to thrive in adult   Transaminitis   Weakness   Hypernatremia   Protein-calorie malnutrition, severe  Severe Failure to thrive in adult/ Weight loss/ Depression -Presented with poor oral intake, poor appetite,weight loss, uncontrolled depression -Malignancy work-up unremarkable so far -Seen by psychiatry, they started Zoloft and discontinued Elavil -Seen by dietitian, encouraged increase oral protein intake -Also followed by palliative care, patient refused to have any discussions with them, palliative team recommended outpatient follow-up with palliative at SNF -Patient is very depressed and is not motivated and only wants to sleep, p.o. intake is very poor -Psychiatry reconsulted and subsequently discontinued  sertraline and added  Wellbutrin 150 mg p.o. daily for depression and mirtazapine 50 mg p.o. nightly for depression,insomnia and appetite stimulation -Continues to have poor appetite, poor motivation to get out of bed, sit up etc -worsening failure to thrive and renal failure, nephrotic syndrome -At this time we are awaiting kidney biopsy, to rule out inflammatory glomerulopathies, regardless his decline continues, discussed role of hospice with patient's niece Alvin Ingram again 4/19 -Biopsy results showed AL amyloidosis but has a poor prognosis.  We will consult palliative care for further goals of care discussion and family has elected to transfer the patient to residential hospice given that he is more confused and that his prognosis is very poor.  Patient had repeatedly refused dialysis  AKI on CKD IV Severe hypoalbuminemia ? Nephrotic syndrome -Presented with creatinine 3.24, proteuniuria -Baseline creatinine 2 with GFR 38 -BUN/creatinine remained relatively stable since admission however subsequently continued to worsen in the last week, creatinine up to 4.[facility] nephrology consult, concern for nephrotic syndrome, renal biopsy performed 4/15 in radiology -Urine protein greater than 3 g, UPEP pending, serologies and complements, SPEP negative -Patient has continued to decline hemodialysis, this was discussed with him on multiple occasions, also has a niece and nephew are involved in his care, they are familiar with current situation, discussed overall poor prognosis, discussed CODE STATUS with niece, now DNR -Patient continues to decline and hospice is most appropriate now -if he had improveed and renal biopsy indicates a treatable cause of nephrotic syndrome then plan would be for SNF with palliative care however 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 prognosis and nephrology recommends that since he is averse to dialysis that the best course of action would be to  transfer to residential hospice given that he is worsening with his renal function is more confused and becoming uremic -Patient has declined hemodialysis on numerous discussions and would not be appropriate as well -Renal has nothing else to offer and has signed off the case  Severe protein calorie malnutrition -Continues to have minimal oral intake  -Albumin was 1.7 -We will need residential hospice given that is not eating very much  Esophagitis/Hemorrhagic Gastritis -Continue PPI twice daily for now and further care per hospice protocol  Chronic Hoarseness  -CT neck and laryngoscopic exam was unremarkable by ENT  -ENT suspects hoarseness may be related to GERD -Patient will be transferred to Residential hospice now  15 mm low attenuating lesion with fluid attenuation along the posterior distal body of the pancreas/B/L renal cysts -MRI shows cystic lesion of the pancreas, -Recommend surveillance withrepeat imagings in 2 years per patient going to residential hospice  Resolved Hypovolemic hypernatremia with iV fluid -resolved with hydration but this is now stopped -Patient to be transferred to residential hospice today  Chronic Diastolic CHF -Last 2D echo done on 12/17/2019 showed LVEF 65-70% With grade 1 diastolic dysfunction -Did receive Lasix in the early part of this admission which was subsequently stopped -Continue to monitor carefully patient remains significantly volume overloaded  Orthostatic Hypotension -Following diuresis, Lasix subsequently discontinued and he was given fluid bolus -Continue to monitor carefully  BPH -Continue Flomax for now, PSA was 1.8  Ambulatory dysfunction with frequent falls -Has had 3 falls at home within the last 2 weeks per his niece -Lives alone, has not been able to appropriately take care of himself per his niece. 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 -PT OT was recommending SNF however will need residential hospice at discharge now  Normocytic Anemia -Hemoglobin/hematocrit dropped and hemoglobin went from 9.1/27.8 and dropped down to 5.9/18.1; H&H this morning was 6.0/18.4 -Type and screen and transfuse 1 unit PRBCs -We will not repeat hemoglobin/hematocrit as patient is to be transferred to residential hospice this a.m. if bed is now available  Thrombocytopenia -Was mild and currently stable with a platelet count of 123,000 on last check -Continue to monitor for signs and symptoms of bleeding; currently no overt bleeding noted -We will not check again as patient is going to residential hospice  Discharge Instructions  Discharge Instructions    Call MD for:  difficulty breathing, headache or visual disturbances   Complete by: As directed    Call MD for:  extreme fatigue   Complete by: As directed    Call MD for:  hives   Complete by: As directed    Call MD for:  persistant dizziness or light-headedness   Complete by: As directed    Call MD for:  persistant nausea and vomiting   Complete by: As directed    Call MD for:  redness, tenderness, or signs of infection (pain, swelling, redness, odor or green/yellow discharge around incision site)   Complete by: As directed    Call MD for:  severe uncontrolled pain   Complete by: As directed    Call MD for:  temperature >100.4   Complete by: As directed    Diet - low sodium heart healthy   Complete by: As directed    Discharge instructions   Complete by: As directed    Further Care per Hospice Protocol   Increase activity slowly   Complete by: As directed      Allergies as of 03/18/2020   No Known Allergies     Medication List    STOP taking these medications   amitriptyline 25 MG tablet Commonly known as: ELAVIL   aspirin 81 MG tablet    furosemide 40 MG tablet Commonly known as: LASIX   omeprazole 20 MG capsule Commonly known as: PRILOSEC     TAKE these medications   acetaminophen 325 MG tablet Commonly known as: TYLENOL Take 2 tablets (650 mg total) by mouth every 6 (six) hours as needed for mild pain, moderate pain, fever or headache.   buPROPion 150 MG 24 hr tablet Commonly known as: WELLBUTRIN XL Take 1 tablet (150 mg total) by mouth daily. Start taking on: March 19, 2020   CENTRUM SILVER PO Take 1 tablet by mouth daily.   feeding supplement (NEPRO CARB STEADY) Liqd Take 237 mLs by mouth daily.   mirtazapine 15 MG tablet Commonly known as: REMERON Take 1 tablet (15 mg total) by mouth at bedtime.   pantoprazole 40 MG tablet Commonly known as: PROTONIX Take 1 tablet (40 mg total) by mouth 2 (two) times daily.   polyethylene glycol 17 g packet Commonly known as: MIRALAX / GLYCOLAX Take 17 g by mouth daily.   senna 8.6 MG Tabs tablet Commonly known as: SENOKOT Take 1 tablet (8.6 mg total) by mouth daily. What changed:   when to take this  reasons to take this   simvastatin 20 MG tablet Commonly known as: ZOCOR TAKE 1 TABLET BY MOUTH AT BEDTIME   tamsulosin 0.4 MG Caps capsule Commonly known as: FLOMAX Take 0.4 mg by mouth daily after supper.  ASK your doctor about these medications   feeding supplement (ENSURE ENLIVE) Liqd Take 237 mLs by mouth 2 (two) times daily between meals for 10 days. Ask about: Should I take this medication?       Contact information for follow-up providers    Alvin Rail, MD. Call in 1 day(s).   Specialty: Internal Medicine Why: Please call for a post hospital follow-up appointment. Contact information: [place] [address] [phone]        Franchot Gallo, MD. Call in 1 day(s).   Specialty: Urology Why: Please call for a post hospital follow-up appointment. Contact information: [place]  [address] [phone]        Richmond Campbell, MD. Call in 1 day(s).   Specialty: Gastroenterology Why: Please call for a post hospital follow-up appointment. Contact information: [place] [address] [phone]        Hampton Abbot, MD. Call in 1 day(s).   Specialty: Psychiatry Why: Please call for a post hospital follow-up appointment. Contact information: [place] [address] [phone]        Justin Mend, MD Follow up on 03/17/2020.   Specialty: Internal Medicine Why: 10:45 arrival time for 11 am appt on Wed 03/17/20, for kidney failure hospital f/u visit Contact information: [place] [address] [phone]            Contact information for after-discharge care    Destination    [facility] .   Service: Skilled Nursing Contact information: [place] [phone]                 No Known Allergies  Consultations:  Gastroenterology  Palliative Care Medicine  ENT  Nephrology; reconsulted  Psychiatry   Procedures/Studies: CT SOFT TISSUE NECK WO CONTRAST  Result Date: 03/02/2020 CLINICAL DATA:  Dysphagia. EGD with biopsy yesterday. Esophagitis and hemorrhagic gastritis. Nausea vomiting and weight loss. EXAM: CT NECK WITHOUT CONTRAST TECHNIQUE: Multidetector CT imaging of the neck was performed following the standard protocol without intravenous contrast. COMPARISON:  None. FINDINGS: Pharynx and larynx: Asymmetric enlargement of the left laryngeal ventricle. Thinning of the left vocal cord. Mild thickening of the left aryepiglottic fold. 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 Correlate with hoarseness. No mass lesion identified. Epiglottis and pharynx otherwise normal. Salivary glands: No inflammation, mass, or stone. Thyroid: Negative Lymph nodes: No enlarged lymph nodes in the neck. Vascular: Limited vascular evaluation  without intravenous contrast. Limited intracranial: Negative Visualized orbits: Bilateral cataract extraction.  No orbital mass. Mastoids and visualized paranasal sinuses: Mild mucosal edema paranasal sinuses. Air-fluid level left sphenoid sinus. Bony thickening left sphenoid sinus. Mastoid clear bilaterally. Skeleton: Cervical spondylosis with disc and facet degeneration at multiple levels. No acute skeletal abnormality. Upper chest: Visualized lung apices clear bilaterally. Mild apical scarring bilaterally. 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 Correlate with hoarseness. Electronically Signed   By: Franchot Gallo M.D.   On: 03/02/2020 15:49   MR ABDOMEN WO CONTRAST  Result Date: 02/25/2020 CLINICAL DATA:  Pancreatic lesion on CT EXAM: MRI ABDOMEN WITHOUT CONTRAST TECHNIQUE: Multiplanar multisequence MR imaging was performed without the administration of intravenous contrast. COMPARISON:  CT abdomen/pelvis dated 01/02/2020 FINDINGS: Limited evaluation due to motion degradation and lack of intravenous contrast administration. Lower chest: Lung bases are clear. Hepatobiliary: Unenhanced liver is unremarkable. No  morphologic findings of cirrhosis. No hepatic steatosis. Gallbladder is unremarkable. No intrahepatic or extrahepatic ductal dilatation. Pancreas: 8 mm unilocular cyst along the proximal pancreatic tail (series 3/image 8), likely benign. Additional 14 mm unilocular cyst at the junction of the pancreatic tail and anterior left kidney (series 5/image 23). While the origin is indeterminate on the current study, this favors a pancreatic cyst on prior CT. No pancreatic atrophy or ductal dilatation. Spleen:  Within normal limits. Adrenals/Urinary Tract:  Adrenal glands are within normal limits. Multiple bilateral renal cysts, measuring up to 3.8 cm in the anterior left lower kidney (series 5/image 29), simple/benign on MRI. No  hydronephrosis. Stomach/Bowel: Stomach is within normal limits. Visualized bowel is unremarkable, noting sigmoid diverticulosis. Vascular/Lymphatic:  No evidence of abdominal aortic aneurysm. No suspicious abdominal lymphadenopathy. Other:  No abdominal ascites. Musculoskeletal: No focal osseous lesions. IMPRESSION: Limited evaluation due to motion degradation and lack of intravenous contrast administration. Two unilocular pancreatic tail cysts measuring up to 14 mm, likely benign. Given patient's age, it is unclear that follow-up is warranted. However, if desired, initial follow-up CT or MRI abdomen with/without contrast is suggested in 2 years. This recommendation follows ACR consensus guidelines: Management of Incidental Pancreatic Cysts: A [place] Paper of the ACR Incidental Findings Committee. [place] [id number]. Electronically Signed   By: Julian Hy M.D.   On: 02/25/2020 20:50   US RENAL  Result Date: 02/29/2020 CLINICAL DATA:  Acute kidney injury EXAM: RENAL / URINARY TRACT ULTRASOUND COMPLETE COMPARISON:  Abdominal MRI February 25, 2020; renal ultrasound December 17, 2019 FINDINGS: Right Kidney: Renal measurements: 11.1 x 4.0 x 4.5 cm = volume: 104.6 mL . Echogenicity is increased. Renal cortical thickness within normal limits. No perinephric fluid or hydronephrosis visualized. There is a cyst in the upper right kidney measuring 3.3 x 3.1 x 1.8 cm. There is a cyst in the mid right kidney measuring 2.0 x 2.0 x 1.9 cm. Several smaller renal cysts are noted on the right. No sonographically demonstrable calculus or ureterectasis. Left Kidney: Renal measurements: 11.1 x 6.5 x 6.6 cm = volume: 249.4 mL. Echogenicity is increased. Renal cortical thickness is normal. No perinephric fluid or hydronephrosis visualized. Multiple renal cysts noted on the left. Largest cyst arises in the lower pole region measuring 3.9 x 3.8 x 3.3 cm. A cyst in the upper pole region is mildly septated and measures 3.3  x 2.3 x 2.2 cm. A cyst in the mid left kidney measures 3.0 x 2.8 x 2.5 cm. Smaller cysts noted as well. No sonographically demonstrable calculus or ureterectasis. Bladder: Appears normal for degree of bladder distention. Other: Prostatic calcifications noted. Prostate measures 4.9 x 4.3 x 3.2 cm with a measured prostate volume of 34.6 cubic cm. Small right pleural effusion. IMPRESSION: 1. Echogenic kidneys bilaterally, a finding indicative of medical renal disease. Renal cortical thickness normal. No obstructing focus in either kidney. 2. Multiple renal cysts bilaterally. There is septation in a cyst on the left. Other cysts are simple cysts. 3.  Prostatic calculi.  Prostate mildly prominent in size. 4.  Small right pleural effusion. Electronically Signed   By: Lowella Grip III M.D.   On: 02/29/2020 08:52   DG CHEST PORT 1 VIEW  Result Date: 03/04/2020 CLINICAL DATA:  Shortness of breath EXAM: PORTABLE CHEST 1 VIEW COMPARISON:  02/13/2020 FINDINGS: Cardiac shadow is within normal limits. Aortic calcifications are again seen. The lungs are hyperinflated. No focal infiltrate or sizable effusion is seen. No bony abnormality is noted.  IMPRESSION: COPD without acute abnormality. Electronically Signed   By: Inez Catalina M.D.   On: 03/04/2020 08:08   DG ABD ACUTE 2+V W 1V CHEST  Result Date: 02/22/2020 CLINICAL DATA:  Nausea with eating. EXAM: DG ABDOMEN ACUTE W/ 1V CHEST COMPARISON:  CT abdomen pelvis dated 01/02/2020. FINDINGS: There is no evidence of dilated bowel loops or free intraperitoneal air. No radiopaque calculi or other significant radiographic abnormality is seen. Heart size and mediastinal contours are within normal limits. The lungs are hyperinflated. Both lungs are clear. IMPRESSION: Negative abdominal radiographs. No acute cardiopulmonary disease. Hyperinflated lungs likely reflect chronic obstructive pulmonary disease. Electronically Signed   By: Zerita Boers M.D.   On: 02/22/2020 17:34    US BIOPSY (KIDNEY)  Result Date: 03/11/2020 INDICATION: Proteinuria, nephrotic syndrome, renal failure EXAM: ULTRASOUND GUIDED CORE BIOPSY OF LEFT RENAL CORTEX MEDICATIONS: 1% LIDOCAINE LOCAL ANESTHESIA/SEDATION: Versed 1.0mg  IV; Fentanyl 84mcg IV; Moderate Sedation Time:  10 MINUTES The patient was continuously monitored during the procedure by the interventional radiology nurse under my direct supervision. FLUOROSCOPY TIME:  Fluoroscopy Time: NONE. COMPLICATIONS: None immediate. PROCEDURE: The procedure, risks, benefits, and alternatives were explained to the patient. Questions regarding the procedure were encouraged and answered. The patient understands and consents to the procedure. The left flank was prepped with ChloraPrep in a sterile fashion, and a sterile drape was applied covering the operative field. A sterile gown and sterile gloves were used for the procedure. Local anesthesia was provided with 1% Lidocaine. Preliminary ultrasound performed. The left kidney was localized and marked. Under sterile conditions and local anesthesia, the 15 gauge guide was advanced to the left kidney mid upper pole cortex directed away from the renal cyst and the bowel inferiorly. Needle position confirmed with ultrasound. 2 16 gauge core biopsies obtained of the cortex. Needle position reconfirmed with imaging. Samples were intact and non fragmented. These were placed in saline. Needle tract occluded with Gel-Foam. Postprocedure imaging demonstrates no hemorrhage or hematoma. Patient tolerated biopsy well. FINDINGS: Imaging confirms needle placement to the left renal cortex for core biopsy IMPRESSION: Successful ultrasound left renal cortex 16 gauge core biopsy Electronically Signed   By: Jerilynn Mages.  Shick M.D.   On: 03/11/2020 10:06   US Abdomen Limited RUQ  Result Date: 02/22/2020 CLINICAL DATA:  Elevated LFTs, nausea EXAM: ULTRASOUND ABDOMEN LIMITED RIGHT UPPER QUADRANT COMPARISON:  01/02/2020, 05/02/2007 FINDINGS:  Gallbladder: No gallstones or wall thickening visualized. No sonographic Murphy sign noted by sonographer. Common bile duct: Diameter: 3 mm Liver: No focal lesion identified. Within normal limits in parenchymal echogenicity. Portal vein is patent on color Doppler imaging with normal direction of blood flow towards the liver. Other: None. IMPRESSION: 1. Unremarkable right upper quadrant ultrasound. Electronically Signed   By: Randa Ngo M.D.   On: 02/22/2020 17:56    EGD 4/5 Impression: - Z-line regular, 42 cm from the incisors. - LA Grade C esophagitis with no bleeding. Biopsied. - Gastritis with hemorrhage. Biopsied. - Extrinsic compression in the gastric antrum. - Normal duodenal bulb, first portion of the  duodenum and second portion of the duodenum.  Subjective: Seen and examined at bedside and he was complaining of some leg pain.  Wanted to be left alone.  Still remains somewhat confused.  No other concerns or close at this time and is stable to be transferred to a residential hospice.  Discharge Exam: Vitals:   03/17/20 2049 03/18/20 0458  BP: 110/62 93/60  Pulse: 81 79  Resp: (!) 21 16  Temp: (!) 97.3 F (36.3 C) 97.6 F (36.4 C)  SpO2: 100% 100%   Vitals:   03/17/20 1742 03/17/20 1820 03/17/20 2049 03/18/20 0458  BP: (!) 93/51 (!) 79/51 110/62 93/60  Pulse: 81 80 81 79  Resp: 18 16 (!) 21 16  Temp: 97.6 F (36.4 C) (!) 97.4 F (36.3 C) (!) 97.3 F (36.3 C) 97.6 F (36.4 C)  TempSrc: Oral Oral Oral   SpO2: 100% 100% 100% 100%  Weight:    65.5 kg  Height:        General: Pt is awake but not as alert and currently not in acute distress Cardiovascular: RRR, S1/S2 +, no rubs, no gallops Respiratory: Diminished bilaterally, no wheezing, no rhonchi; unlabored breathing Abdominal: Soft, NT, ND, bowel sounds + Extremities: 2+  lower extremity edema, no cyanosis  The results of significant diagnostics from this hospitalization (including imaging, microbiology, ancillary and laboratory) are listed below for reference.    Microbiology: Recent Results (from the past 240 hour(s))  MRSA PCR Screening     Status: None   Collection Time: 03/12/20 11:35 AM   Specimen: Nasal Mucosa; Nasopharyngeal  Result Value Ref Range Status   MRSA by PCR NEGATIVE NEGATIVE Final    Comment:        The GeneXpert MRSA Assay (FDA approved for NASAL specimens only), is one component of a comprehensive MRSA colonization surveillance program. It is not intended to diagnose MRSA infection nor to guide or monitor treatment for MRSA infections. Performed at [facility], [place] [address] [place], [place] [address]     Labs: BNP (last 3 results) No results for input(s): BNP in the last 8760 hours. Basic Metabolic Panel: Recent Labs  Lab 03/13/20 0248 03/14/20 0543 03/15/20 0517 03/16/20 0518 03/17/20 0540  NA 145 145 145 144 144  K 4.2 4.1 4.3 4.1 4.3  CL 110 109 108 109 107  CO2 27 27 28 26 25   GLUCOSE 103* 88 107* 93 102*  BUN 87* 85* 95* 87* 97*  CREATININE 4.13* 4.45* 4.60* 4.66* 4.91*  CALCIUM 8.1* 8.2* 8.2* 8.0* 8.1*  PHOS 4.9* 5.2* 4.8* 4.5 5.5*   Liver Function Tests: Recent Labs  Lab 03/13/20 0248 03/14/20 0543 03/15/20 0517 03/16/20 0518 03/17/20 0540  ALBUMIN 1.7* 1.7* 1.7* 1.5* 1.7*   No results for input(s): LIPASE, AMYLASE in the last 168 hours. No results for input(s): AMMONIA in the last 168 hours. CBC: Recent Labs  Lab 03/12/20 1215 03/13/20 0248 03/14/20 0543 03/17/20 0540 03/17/20 0703  WBC 6.8 7.2 6.8 8.5  --   HGB 10.4* 10.9* 9.1* 5.9* 6.0*  HCT 31.4* 33.3* 27.8* 18.1* 18.4*  MCV 100.0 100.9* 100.0 102.8*  --   PLT 129* 133* 127* 123*  --    Cardiac Enzymes: No results for input(s): CKTOTAL, CKMB, CKMBINDEX, TROPONINI in the last 168 hours. BNP: Invalid  input(s): POCBNP CBG: Recent Labs  Lab 03/12/20 1103  GLUCAP 104*   D-Dimer No results for input(s): DDIMER in the last 72 hours. Hgb A1c No results for input(s): HGBA1C in the last 72 hours. Lipid Profile No results for input(s): CHOL, HDL, LDLCALC, TRIG, CHOLHDL, LDLDIRECT in the last 72 hours. Thyroid function studies No results for input(s): TSH, T4TOTAL, T3FREE, THYROIDAB in the last 72 hours.  Invalid input(s): FREET3 Anemia work up No results for input(s): VITAMINB12, FOLATE, FERRITIN, TIBC, IRON, RETICCTPCT in the last 72 hours. Urinalysis    Component Value Date/Time   COLORURINE YELLOW 02/22/2020 1540  APPEARANCEUR CLEAR 02/22/2020 1540   LABSPEC 1.014 02/22/2020 1540   PHURINE 6.0 02/22/2020 1540   GLUCOSEU NEGATIVE 02/22/2020 1540   GLUCOSEU NEGATIVE 01/02/2020 1028   HGBUR NEGATIVE 02/22/2020 1540   [place] 02/22/2020 1540   KETONESUR NEGATIVE 02/22/2020 1540   PROTEINUR >=300 (A) 02/22/2020 1540   UROBILINOGEN 0.2 01/02/2020 1028   NITRITE NEGATIVE 02/22/2020 1540   LEUKOCYTESUR NEGATIVE 02/22/2020 1540   Sepsis Labs Invalid input(s): PROCALCITONIN,  WBC,  LACTICIDVEN Microbiology Recent Results (from the past 240 hour(s))  MRSA PCR Screening     Status: None   Collection Time: 03/12/20 11:35 AM   Specimen: Nasal Mucosa; Nasopharyngeal  Result Value Ref Range Status   MRSA by PCR NEGATIVE NEGATIVE Final    Comment:        The GeneXpert MRSA Assay (FDA approved for NASAL specimens only), is one component of a comprehensive MRSA colonization surveillance program. It is not intended to diagnose MRSA infection nor to guide or monitor treatment for MRSA infections. Performed at [facility], [place] [address] [place], [place] [address]    Time coordinating discharge: 25 minutes  SIGNED:  Kerney Elbe, DO [facility] 03/18/2020, 11:18 AM Pager is on [place]  If 7PM-7AM, please contact  night-coverage [email or web address] Password [email or web address]

## 2020-03-27 DEATH — deceased

## 2020-08-20 ENCOUNTER — Encounter: Payer: Medicare HMO | Admitting: Internal Medicine

## 2021-04-01 IMAGING — MR MR ABDOMEN W/O CM
10 series · 48 of 48 positions shown · non-contrast
Comparison: CT abdomen/pelvis dated 01/02/2020

CLINICAL DATA: Pancreatic lesion on CT

EXAM:
MRI ABDOMEN WITHOUT CONTRAST
TECHNIQUE: Multiplanar multisequence MR imaging was performed without the
administration of intravenous contrast.

[Series 3: T2 · coronal · 6.0mm · 1.56mm/px · 2 of 26 slices shown (1 of 2)]
[im 1/26]
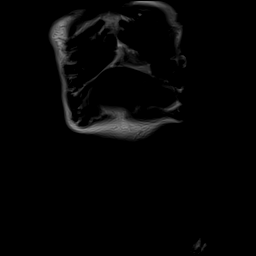
[im 26/26]
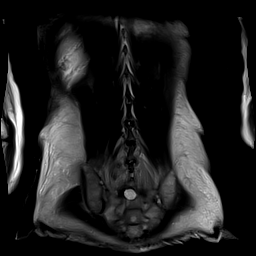

[Series 4: T2 fat-sat · 1 of 8 slices shown (1 of 2)]
[im 1/8]
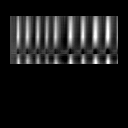

[Series 5: T2 fat-sat · axial · 6.0mm · 1.25mm/px · z∈[-103,+149]mm · 4 of 36 slices shown (2 of 2)]
[im 1/36]
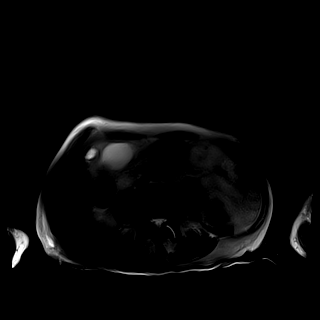
[im 12/36]
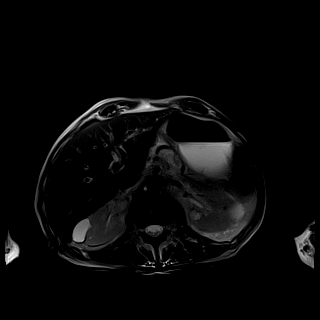
[im 24/36]
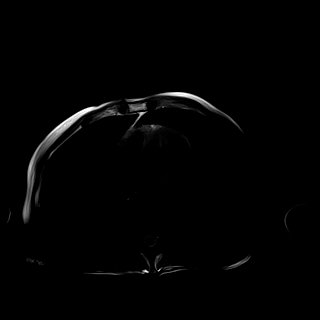
[im 36/36]
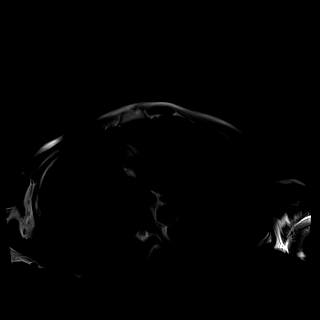

[Series 6: T1 · axial · 3.0mm · 1.25mm/px · z∈[-183,+30]mm · 8 of 72 slices shown (1 of 2)]
[im 1/72]
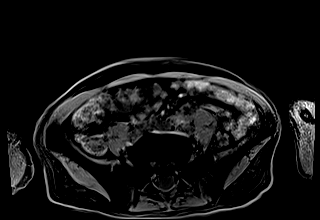
[im 11/72]
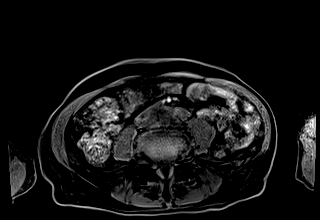
[im 21/72]
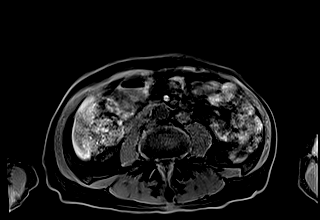
[im 31/72]
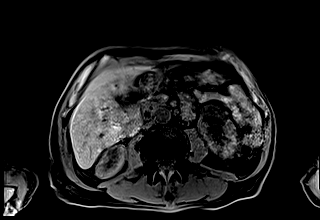
[im 41/72]
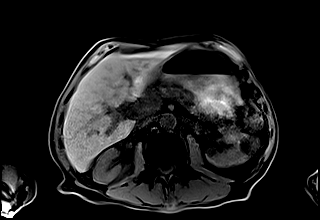
[im 51/72]
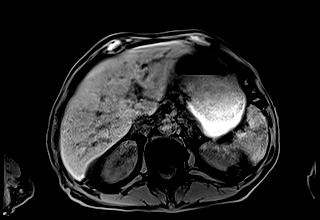
[im 61/72]
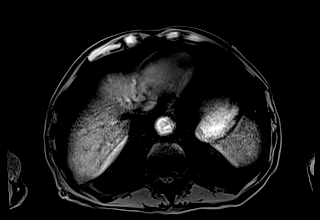
[im 72/72]
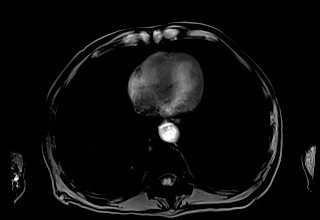

[Series 7: T1 · axial · 3.0mm · 1.25mm/px · z∈[-183,+30]mm · 8 of 72 slices shown (2 of 2)]
[im 1/72]
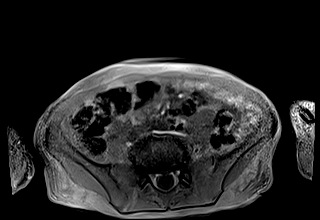
[im 11/72]
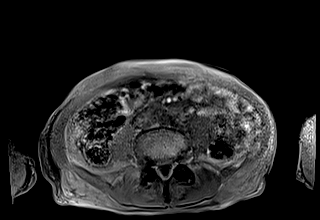
[im 21/72]
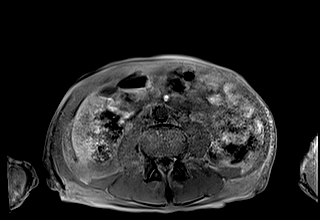
[im 31/72]
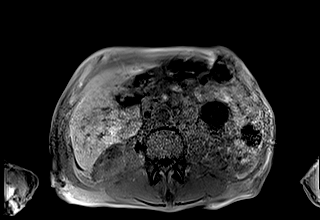
[im 41/72]
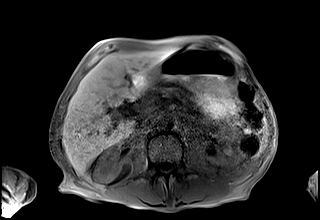
[im 51/72]
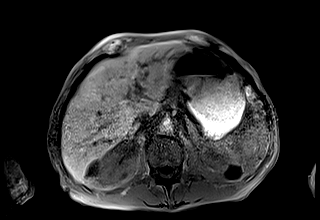
[im 61/72]
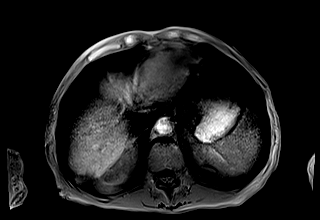
[im 72/72]
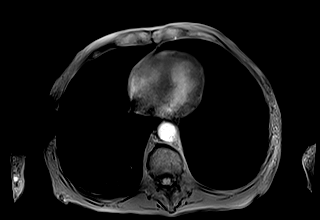

[Series 8: DWI · axial · 6.0mm · 1.49mm/px · z∈[-181,+13]mm · 6 of 56 slices shown (1 of 2)]
[im 1/56]
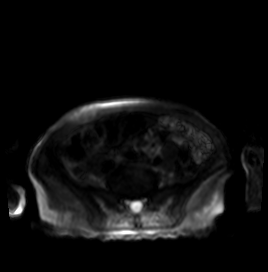
[im 12/56]
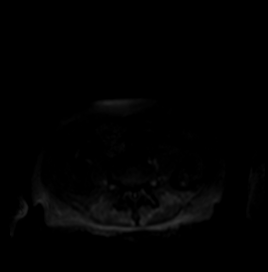
[im 23/56]
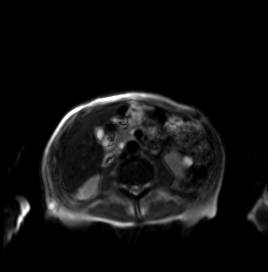
[im 34/56]
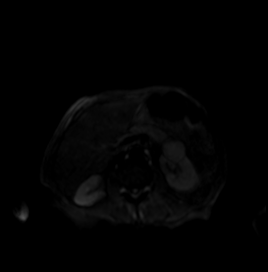
[im 45/56]
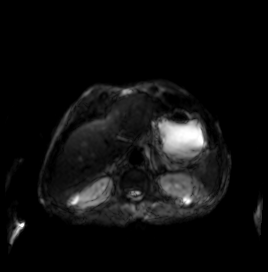
[im 56/56]
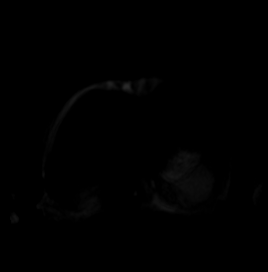

[Series 9: DWI · axial · 6.0mm · 1.49mm/px · z∈[-181,+13]mm · 3 of 28 slices shown (2 of 2)]
[im 1/28]
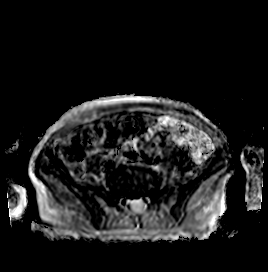
[im 14/28]
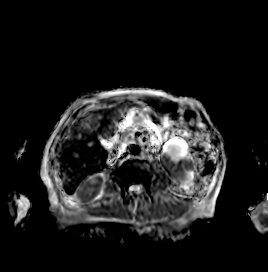
[im 28/28]
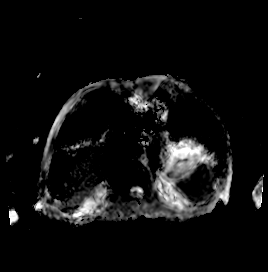

[Series 11: bSSFP · axial · 5.0mm · 0.84mm/px · z∈[-187,+28]mm · 4 of 40 slices shown]
[im 1/40]
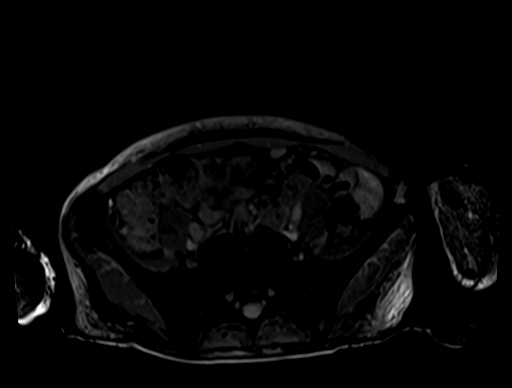
[im 14/40]
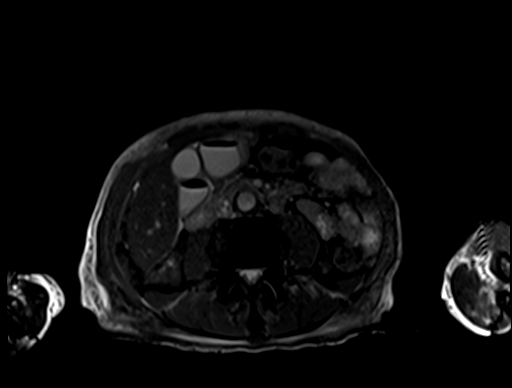
[im 27/40]
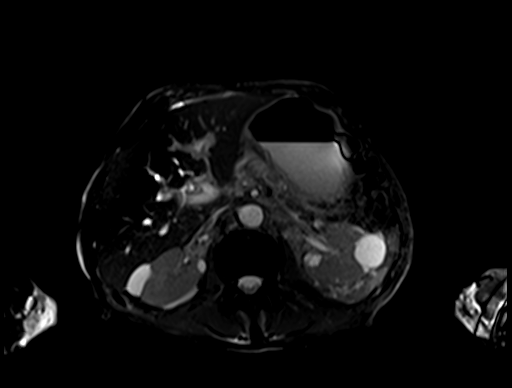
[im 40/40]
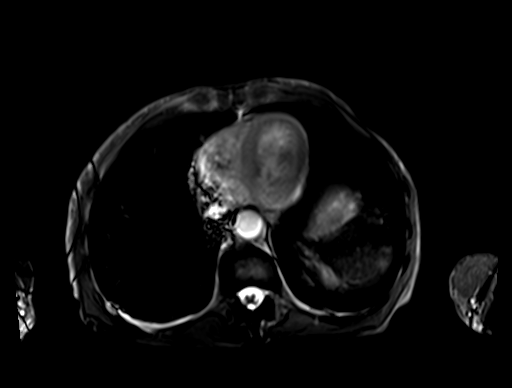

[Series 12: T2 · axial · 6.0mm · 1.56mm/px · z∈[-183,+26]mm · 3 of 30 slices shown (2 of 2)]
[im 1/30]
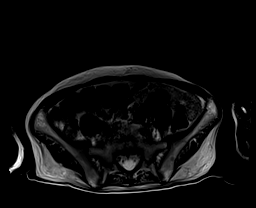
[im 15/30]
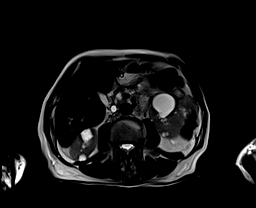
[im 30/30]
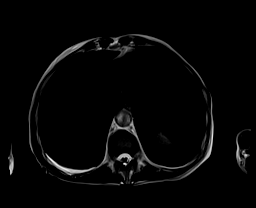

[Series 15: T1 dynamic · axial · 3.0mm · 1.25mm/px · z∈[-195,+42]mm · 9 of 80 slices shown]
[im 1/80]
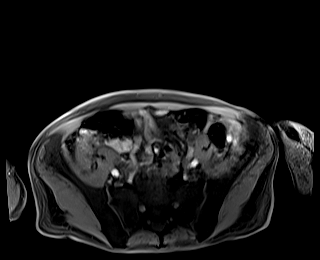
[im 10/80]
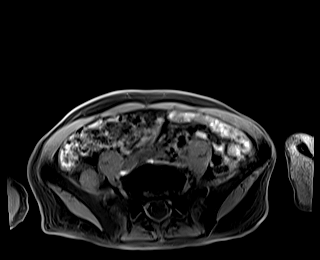
[im 20/80]
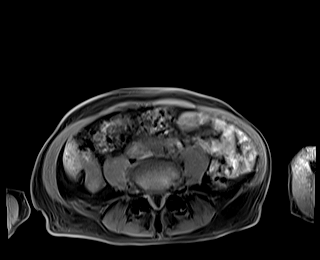
[im 30/80]
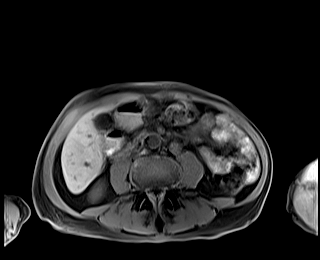
[im 40/80]
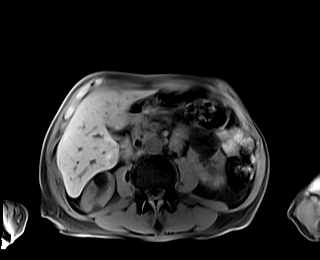
[im 50/80]
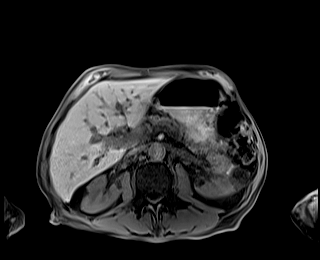
[im 60/80]
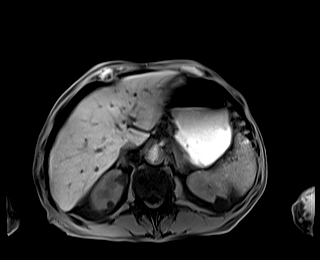
[im 70/80]
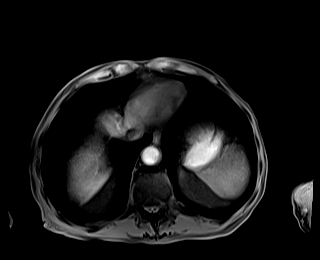
[im 80/80]
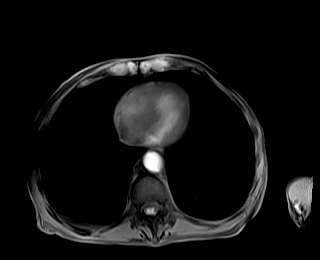

[48 of 48 positions shown; findings below may reference images not displayed]

FINDINGS: Limited evaluation due to motion degradation and lack of intravenous
contrast administration.

Lower chest: Lung bases are clear.

Hepatobiliary: Unenhanced liver is unremarkable. No morphologic
findings of cirrhosis. No hepatic steatosis.

Gallbladder is unremarkable. No intrahepatic or extrahepatic ductal
dilatation.

Pancreas: 8 mm unilocular cyst along the proximal pancreatic tail
(series 3/image 8), likely benign. Additional 14 mm unilocular cyst
at the junction of the pancreatic tail and anterior left kidney
(series 5/image 23). While the origin is indeterminate on the
current study, this favors a pancreatic cyst on prior CT. No
pancreatic atrophy or ductal dilatation.

Spleen:  Within normal limits.

Adrenals/Urinary Tract:  Adrenal glands are within normal limits.

Multiple bilateral renal cysts, measuring up to 3.8 cm in the
anterior left lower kidney (series 5/image 29), simple/benign on
MRI. No hydronephrosis.

Stomach/Bowel: Stomach is within normal limits.

Visualized bowel is unremarkable, noting sigmoid diverticulosis.

Vascular/Lymphatic:  No evidence of abdominal aortic aneurysm.

No suspicious abdominal lymphadenopathy.

Other:  No abdominal ascites.

Musculoskeletal: No focal osseous lesions.
IMPRESSION: Limited evaluation due to motion degradation and lack of intravenous
contrast administration.

Two unilocular pancreatic tail cysts measuring up to 14 mm, likely
benign. Given patient's age, it is unclear that follow-up is
warranted. However, if desired, initial follow-up CT or MRI abdomen
with/without contrast is suggested in 2 years. This recommendation
follows ACR consensus guidelines: Management of Incidental
Pancreatic Cysts: A White Paper of the ACR Incidental Findings
Committee. [HOSPITAL] [id number];[DATE].
